# Patient Record
Sex: Female | Born: 1935 | Race: White | Hispanic: No | Smoking: Former smoker
Health system: Southern US, Community
[De-identification: ages and names within clinical notes are randomized; demographics above are authoritative.]

## PROBLEM LIST (undated history)

## (undated) DIAGNOSIS — H3530 Unspecified macular degeneration: Secondary | ICD-10-CM

## (undated) DIAGNOSIS — H547 Unspecified visual loss: Secondary | ICD-10-CM

## (undated) DIAGNOSIS — J309 Allergic rhinitis, unspecified: Secondary | ICD-10-CM

## (undated) DIAGNOSIS — F329 Major depressive disorder, single episode, unspecified: Secondary | ICD-10-CM

## (undated) DIAGNOSIS — E039 Hypothyroidism, unspecified: Secondary | ICD-10-CM

## (undated) DIAGNOSIS — M899 Disorder of bone, unspecified: Secondary | ICD-10-CM

## (undated) DIAGNOSIS — K219 Gastro-esophageal reflux disease without esophagitis: Secondary | ICD-10-CM

## (undated) DIAGNOSIS — F411 Generalized anxiety disorder: Secondary | ICD-10-CM

## (undated) DIAGNOSIS — R0602 Shortness of breath: Secondary | ICD-10-CM

## (undated) DIAGNOSIS — E119 Type 2 diabetes mellitus without complications: Secondary | ICD-10-CM

## (undated) DIAGNOSIS — D869 Sarcoidosis, unspecified: Secondary | ICD-10-CM

## (undated) DIAGNOSIS — M949 Disorder of cartilage, unspecified: Secondary | ICD-10-CM

## (undated) DIAGNOSIS — Z85118 Personal history of other malignant neoplasm of bronchus and lung: Secondary | ICD-10-CM

## (undated) DIAGNOSIS — R7302 Impaired glucose tolerance (oral): Secondary | ICD-10-CM

## (undated) DIAGNOSIS — I2510 Atherosclerotic heart disease of native coronary artery without angina pectoris: Secondary | ICD-10-CM

## (undated) DIAGNOSIS — E785 Hyperlipidemia, unspecified: Secondary | ICD-10-CM

## (undated) DIAGNOSIS — H5440 Blindness, one eye, unspecified eye: Secondary | ICD-10-CM

## (undated) HISTORY — PX: OTHER SURGICAL HISTORY: SHX169

## (undated) HISTORY — DX: Disorder of bone, unspecified: M89.9

## (undated) HISTORY — DX: Major depressive disorder, single episode, unspecified: F32.9

## (undated) HISTORY — DX: Generalized anxiety disorder: F41.1

## (undated) HISTORY — DX: Sarcoidosis, unspecified: D86.9

## (undated) HISTORY — DX: Gastro-esophageal reflux disease without esophagitis: K21.9

## (undated) HISTORY — DX: Atherosclerotic heart disease of native coronary artery without angina pectoris: I25.10

## (undated) HISTORY — DX: Disorder of cartilage, unspecified: M94.9

## (undated) HISTORY — DX: Hypothyroidism, unspecified: E03.9

## (undated) HISTORY — DX: Impaired glucose tolerance (oral): R73.02

## (undated) HISTORY — DX: Unspecified macular degeneration: H35.30

## (undated) HISTORY — DX: Type 2 diabetes mellitus without complications: E11.9

## (undated) HISTORY — DX: Hyperlipidemia, unspecified: E78.5

## (undated) HISTORY — DX: Shortness of breath: R06.02

## (undated) HISTORY — DX: Allergic rhinitis, unspecified: J30.9

## (undated) HISTORY — DX: Personal history of other malignant neoplasm of bronchus and lung: Z85.118

## (undated) HISTORY — DX: Unspecified visual loss: H54.7

## (undated) HISTORY — DX: Blindness, one eye, unspecified eye: H54.40

---

## 1994-05-29 HISTORY — PX: OTHER SURGICAL HISTORY: SHX169

## 2006-01-01 ENCOUNTER — Ambulatory Visit: Payer: Self-pay | Admitting: Internal Medicine

## 2006-01-15 ENCOUNTER — Ambulatory Visit: Payer: Self-pay | Admitting: Internal Medicine

## 2006-02-22 ENCOUNTER — Ambulatory Visit: Payer: Self-pay | Admitting: Internal Medicine

## 2006-08-27 ENCOUNTER — Ambulatory Visit: Payer: Self-pay | Admitting: Internal Medicine

## 2006-10-03 ENCOUNTER — Ambulatory Visit: Payer: Self-pay | Admitting: Internal Medicine

## 2006-10-03 LAB — CONVERTED CEMR LAB
Albumin: 3.5 g/dL (ref 3.5–5.2)
Alkaline Phosphatase: 59 units/L (ref 39–117)
BUN: 5 mg/dL — ABNORMAL LOW (ref 6–23)
Basophils Relative: 0.7 % (ref 0.0–1.0)
Chloride: 104 meq/L (ref 96–112)
Cholesterol: 218 mg/dL (ref 0–200)
Creatinine, Ser: 0.9 mg/dL (ref 0.4–1.2)
Crystals: NEGATIVE
HDL: 48.5 mg/dL (ref >39.0)
Monocytes Relative: 11.5 % — ABNORMAL HIGH (ref 3.0–11.0)
Platelets: 333 10*3/uL (ref 150–400)
Potassium: 4 meq/L (ref 3.5–5.1)
RDW: 12.3 % (ref 11.5–14.6)
Specific Gravity, Urine: <=1.005 (ref 1.000–1.03)
TSH: 9.33 microintl units/mL — ABNORMAL HIGH (ref 0.35–5.50)
Total Bilirubin: 1 mg/dL (ref 0.3–1.2)
Total CHOL/HDL Ratio: 4.5
Urine Glucose: NEGATIVE mg/dL
VLDL: 51 mg/dL — ABNORMAL HIGH (ref 0–40)

## 2006-10-07 ENCOUNTER — Encounter: Admission: RE | Admit: 2006-10-07 | Discharge: 2006-10-07 | Payer: Self-pay | Admitting: Internal Medicine

## 2006-10-11 ENCOUNTER — Ambulatory Visit: Payer: Self-pay | Admitting: Internal Medicine

## 2006-10-15 LAB — CBC WITH DIFFERENTIAL/PLATELET
EOS%: 0.3 % (ref 0.0–7.0)
Eosinophils Absolute: 0 10*3/uL (ref 0.0–0.5)
MCV: 97.6 fL (ref 81.0–101.0)
MONO%: 11.7 % (ref 0.0–13.0)
NEUT#: 3.9 10*3/uL (ref 1.5–6.5)
RBC: 4.17 10*6/uL (ref 3.70–5.32)
RDW: 14 % (ref 11.3–14.5)
WBC: 5.6 10*3/uL (ref 3.9–10.0)

## 2006-10-15 LAB — COMPREHENSIVE METABOLIC PANEL
AST: 20 U/L (ref 0–37)
Albumin: 4.2 g/dL (ref 3.5–5.2)
Alkaline Phosphatase: 63 U/L (ref 39–117)
Glucose, Bld: 79 mg/dL (ref 70–99)
Potassium: 4.3 mEq/L (ref 3.5–5.3)
Sodium: 141 mEq/L (ref 135–145)
Total Protein: 7.2 g/dL (ref 6.0–8.3)

## 2006-10-18 ENCOUNTER — Ambulatory Visit (HOSPITAL_COMMUNITY): Admission: RE | Admit: 2006-10-18 | Discharge: 2006-10-18 | Payer: Self-pay | Admitting: Internal Medicine

## 2006-11-25 ENCOUNTER — Ambulatory Visit: Payer: Self-pay | Admitting: Internal Medicine

## 2006-11-25 LAB — CONVERTED CEMR LAB: Angiotensin 1 Converting Enzyme: 80 units/L — ABNORMAL HIGH (ref 9–67)

## 2006-12-10 ENCOUNTER — Ambulatory Visit: Payer: Self-pay | Admitting: Critical Care Medicine

## 2008-01-29 ENCOUNTER — Telehealth (INDEPENDENT_AMBULATORY_CARE_PROVIDER_SITE_OTHER): Payer: Self-pay | Admitting: *Deleted

## 2008-02-11 ENCOUNTER — Ambulatory Visit: Payer: Self-pay | Admitting: Internal Medicine

## 2008-02-11 DIAGNOSIS — M899 Disorder of bone, unspecified: Secondary | ICD-10-CM

## 2008-02-11 DIAGNOSIS — I2510 Atherosclerotic heart disease of native coronary artery without angina pectoris: Secondary | ICD-10-CM

## 2008-02-11 DIAGNOSIS — D869 Sarcoidosis, unspecified: Secondary | ICD-10-CM

## 2008-02-11 DIAGNOSIS — F3289 Other specified depressive episodes: Secondary | ICD-10-CM

## 2008-02-11 DIAGNOSIS — E039 Hypothyroidism, unspecified: Secondary | ICD-10-CM

## 2008-02-11 DIAGNOSIS — F411 Generalized anxiety disorder: Secondary | ICD-10-CM

## 2008-02-11 DIAGNOSIS — E785 Hyperlipidemia, unspecified: Secondary | ICD-10-CM | POA: Insufficient documentation

## 2008-02-11 DIAGNOSIS — M949 Disorder of cartilage, unspecified: Secondary | ICD-10-CM

## 2008-02-11 DIAGNOSIS — Z85118 Personal history of other malignant neoplasm of bronchus and lung: Secondary | ICD-10-CM

## 2008-02-11 DIAGNOSIS — F329 Major depressive disorder, single episode, unspecified: Secondary | ICD-10-CM

## 2008-02-11 DIAGNOSIS — K219 Gastro-esophageal reflux disease without esophagitis: Secondary | ICD-10-CM

## 2008-02-11 DIAGNOSIS — J309 Allergic rhinitis, unspecified: Secondary | ICD-10-CM

## 2008-02-11 HISTORY — DX: Hypothyroidism, unspecified: E03.9

## 2008-02-11 HISTORY — DX: Personal history of other malignant neoplasm of bronchus and lung: Z85.118

## 2008-02-11 HISTORY — DX: Sarcoidosis, unspecified: D86.9

## 2008-02-11 HISTORY — DX: Disorder of bone, unspecified: M89.9

## 2008-02-11 HISTORY — DX: Generalized anxiety disorder: F41.1

## 2008-02-11 HISTORY — DX: Allergic rhinitis, unspecified: J30.9

## 2008-02-11 HISTORY — DX: Gastro-esophageal reflux disease without esophagitis: K21.9

## 2008-02-11 HISTORY — DX: Major depressive disorder, single episode, unspecified: F32.9

## 2008-02-11 HISTORY — DX: Atherosclerotic heart disease of native coronary artery without angina pectoris: I25.10

## 2008-02-11 HISTORY — DX: Other specified depressive episodes: F32.89

## 2008-02-11 HISTORY — DX: Hyperlipidemia, unspecified: E78.5

## 2008-02-12 ENCOUNTER — Encounter (INDEPENDENT_AMBULATORY_CARE_PROVIDER_SITE_OTHER): Payer: Self-pay | Admitting: *Deleted

## 2008-02-12 ENCOUNTER — Telehealth: Payer: Self-pay | Admitting: Internal Medicine

## 2008-02-13 LAB — CONVERTED CEMR LAB
ALT: 19 units/L (ref 0–35)
Basophils Absolute: 0.1 10*3/uL (ref 0.0–0.1)
Basophils Relative: 2.3 % (ref 0.0–3.0)
Bilirubin Urine: NEGATIVE
Bilirubin, Direct: 0.1 mg/dL (ref 0.0–0.3)
Calcium: 9.3 mg/dL (ref 8.4–10.5)
Cholesterol: 320 mg/dL (ref 0–200)
Creatinine, Ser: 1.2 mg/dL (ref 0.4–1.2)
Crystals: NEGATIVE
Direct LDL: 200.8 mg/dL
GFR calc Af Amer: 56 mL/min
Glucose, Bld: 94 mg/dL (ref 70–99)
HCT: 42.1 % (ref 36.0–46.0)
Hemoglobin: 14.7 g/dL (ref 12.0–15.0)
Ketones, ur: NEGATIVE mg/dL
MCHC: 35 g/dL (ref 30.0–36.0)
MCV: 107 fL — ABNORMAL HIGH (ref 78.0–100.0)
Monocytes Absolute: 0.6 10*3/uL (ref 0.1–1.0)
Neutro Abs: 4.1 10*3/uL (ref 1.4–7.7)
RDW: 12.8 % (ref 11.5–14.6)
Sodium: 139 meq/L (ref 135–145)
TSH: 6.47 microintl units/mL — ABNORMAL HIGH (ref 0.35–5.50)
Total Bilirubin: 0.8 mg/dL (ref 0.3–1.2)
Total CHOL/HDL Ratio: 7.7
Total Protein, Urine: NEGATIVE mg/dL
Total Protein: 7.5 g/dL (ref 6.0–8.3)
Triglycerides: 433 mg/dL (ref 0–149)
Urine Glucose: NEGATIVE mg/dL
pH: 6 (ref 5.0–8.0)

## 2008-02-16 LAB — CONVERTED CEMR LAB: Vit D, 1,25-Dihydroxy: 5 — ABNORMAL LOW (ref 30–89)

## 2008-09-01 ENCOUNTER — Telehealth: Payer: Self-pay | Admitting: Internal Medicine

## 2009-02-25 ENCOUNTER — Telehealth: Payer: Self-pay | Admitting: Internal Medicine

## 2009-03-29 ENCOUNTER — Ambulatory Visit: Payer: Self-pay | Admitting: Internal Medicine

## 2009-03-29 DIAGNOSIS — H3530 Unspecified macular degeneration: Secondary | ICD-10-CM

## 2009-03-29 HISTORY — DX: Unspecified macular degeneration: H35.30

## 2009-03-29 LAB — CONVERTED CEMR LAB
ALT: 13 units/L (ref 0–35)
AST: 17 units/L (ref 0–37)
Alkaline Phosphatase: 63 units/L (ref 39–117)
BUN: 13 mg/dL (ref 6–23)
Basophils Relative: 0.1 % (ref 0.0–3.0)
Bilirubin, Direct: 0 mg/dL (ref 0.0–0.3)
Chloride: 103 meq/L (ref 96–112)
Creatinine, Ser: 0.8 mg/dL (ref 0.4–1.2)
Eosinophils Relative: 5.1 % — ABNORMAL HIGH (ref 0.0–5.0)
GFR calc non Af Amer: 74.29 mL/min (ref >60)
Ketones, ur: NEGATIVE mg/dL
Lymphocytes Relative: 16.7 % (ref 12.0–46.0)
MCV: 104.2 fL — ABNORMAL HIGH (ref 78.0–100.0)
Monocytes Relative: 10.2 % (ref 3.0–12.0)
Neutrophils Relative %: 67.9 % (ref 43.0–77.0)
Nitrite: POSITIVE
Platelets: 292 10*3/uL (ref 150.0–400.0)
RBC: 3.85 M/uL — ABNORMAL LOW (ref 3.87–5.11)
Specific Gravity, Urine: 1.02 (ref 1.000–1.030)
TSH: 5.45 microintl units/mL (ref 0.35–5.50)
Total Bilirubin: 1 mg/dL (ref 0.3–1.2)
Total CHOL/HDL Ratio: 5
Total Protein: 7.3 g/dL (ref 6.0–8.3)
Triglycerides: 301 mg/dL — ABNORMAL HIGH (ref 0.0–149.0)
Urobilinogen, UA: 1 (ref 0.0–1.0)
WBC: 5.9 10*3/uL (ref 4.5–10.5)
pH: 6 (ref 5.0–8.0)

## 2009-08-08 ENCOUNTER — Encounter (INDEPENDENT_AMBULATORY_CARE_PROVIDER_SITE_OTHER): Payer: Self-pay | Admitting: *Deleted

## 2009-11-28 ENCOUNTER — Telehealth: Payer: Self-pay | Admitting: Internal Medicine

## 2010-04-21 ENCOUNTER — Telehealth: Payer: Self-pay | Admitting: Internal Medicine

## 2010-06-26 ENCOUNTER — Telehealth: Payer: Self-pay | Admitting: Internal Medicine

## 2010-08-02 ENCOUNTER — Telehealth: Payer: Self-pay | Admitting: Internal Medicine

## 2010-08-20 ENCOUNTER — Encounter: Payer: Self-pay | Admitting: Ophthalmology

## 2010-08-29 NOTE — Progress Notes (Signed)
Summary: REFILL   Phone Note Call from Patient Call back at Home Phone [phone]   Summary of Call: Patient is requesting refill on lovastatin & levothyoxine - has apt in december.  Initial call taken by: Lamar Sprinkles, CMA,  June 26, 2010 2:12 PM    Prescriptions: LOVASTATIN 40 MG  TABS (LOVASTATIN) 1 by mouth once daily  #30 Tablet x 1   Entered by:   Scharlene Gloss CMA (AAMA)   Authorized by:   Corwin Levins MD   Signed by:   Scharlene Gloss CMA (AAMA) on 06/26/2010   Method used:   Faxed to ...       [facility] (retail)       [address]       [place], [place]  [address]       Ph: [phone] or [phone]       Fax: [phone]   RxID:   [id number] LEVOTHYROXINE SODIUM 75 MCG  TABS (LEVOTHYROXINE SODIUM) 1 by mouth once daily  #30 Tablet x 1   Entered by:   Scharlene Gloss CMA (AAMA)   Authorized by:   Corwin Levins MD   Signed by:   Scharlene Gloss CMA (AAMA) on 06/26/2010   Method used:   Faxed to ...       [facility] (retail)       [address]       [place], [place]  [address]       Ph: [phone] or [phone]       Fax: [phone]   RxID:   [id number]

## 2010-08-29 NOTE — Progress Notes (Signed)
  Phone Note Refill Request Message from:  Fax from Pharmacy on April 21, 2010 4:51 PM  Refills Requested: Medication #1:  LEVOTHYROXINE SODIUM 75 MCG  TABS 1 by mouth once daily   Dosage confirmed as above?Dosage Confirmed   Last Refilled: 03/29/2009   Notes: Karin Golden [place]  Medication #2:  LOVASTATIN 40 MG  TABS 1 by mouth once daily   Dosage confirmed as above?Dosage Confirmed   Last Refilled: 03/29/2009   Notes: Karin Golden [place] Initial call taken by: Zella Ball Ewing CMA Duncan Dull),  April 21, 2010 4:52 PM    Prescriptions: LOVASTATIN 40 MG  TABS (LOVASTATIN) 1 by mouth once daily  #30 x 0   Entered by:   Scharlene Gloss CMA (AAMA)   Authorized by:   Corwin Levins MD   Signed by:   Scharlene Gloss CMA (AAMA) on 04/21/2010   Method used:   Faxed to ...       [facility] (retail)       [address]       [place], [place]  [address]       Ph: [phone] or [phone]       Fax: [phone]   RxID:   [id number] LEVOTHYROXINE SODIUM 75 MCG  TABS (LEVOTHYROXINE SODIUM) 1 by mouth once daily  #30 x 0   Entered by:   Zella Ball Ewing CMA (AAMA)   Authorized by:   Corwin Levins MD   Signed by:   Scharlene Gloss CMA (AAMA) on 04/21/2010   Method used:   Faxed to ...       [facility] (retail)       [address]       [place], [place]  [address]       Ph: [phone] or [phone]       Fax: [phone]   RxID:   [id number]

## 2010-08-29 NOTE — Progress Notes (Signed)
Summary: Med Refill  Phone Note Refill Request  on Nov 28, 2009 9:11 AM  Refills Requested: Medication #1:  DIAZEPAM 10 MG TABS 1 by mouth two times a day as needed   Dosage confirmed as above?Dosage Confirmed   Notes: Karin Golden, [place] [phone] Initial call taken by: Scharlene Gloss,  Nov 28, 2009 9:11 AM  Follow-up for Phone Call        Rx faxed to pharmacy Follow-up by: Margaret Pyle, CMA,  Nov 28, 2009 1:19 PM    New/Updated Medications: DIAZEPAM 10 MG TABS (DIAZEPAM) 1 by mouth two times a day as needed - please make return office visit for further refills Prescriptions: DIAZEPAM 10 MG TABS (DIAZEPAM) 1 by mouth two times a day as needed - please make return office visit for further refills  #10 x 5   Entered and Authorized by:   Corwin Levins MD   Signed by:   Corwin Levins MD on 11/28/2009   Method used:   Print then Give to Patient   RxID:   [id number]  done hardcopy to LIM side B - dahlia Corwin Levins MD  Nov 28, 2009 1:00 PM

## 2010-08-29 NOTE — Letter (Signed)
Summary: [facility] Referral (unable to schedule) Notification  [facility]  [address] [place], [place] [address]   Phone: [phone]  Fax: [phone]      August 08, 2009 [facility] 1931-09-20 MRN: [id number]   [facility] [address] [place], [place]  [address]   Dear Dr. Jonny Ruiz:   Thank you for your kind referral of the above patient. We have attempted to schedule the recommended Colonoscopy but have been unable to schedule because:  _x_ The patient was not available by phone and/or has not returned our calls.  __ The patient declined to schedule the procedure at this time.  We appreciate the referral and hope that we will have the opportunity to treat this patient in the future.    Sincerely,   [facility]  Vania Rea. Jarold Motto M.D. Hedwig Morton. Juanda Chance M.D. Venita Lick. Russella Dar M.D. Wilhemina Bonito. Marina Goodell M.D. Barbette Hair. Arlyce Dice M.D. Iva Boop M.D. Cheron Every.D.

## 2010-08-31 NOTE — Progress Notes (Signed)
Summary: Rx refill req  Phone Note Call from Patient Call back at Home Phone [phone]   Caller: Patient Call For: Corwin Levins MD Summary of Call: Pt has appt for yearly f/u for mid Feb, pt needs synthroid & cholesterol med, pt will be out within a wk. Karin Golden [phone] [facility] [phone]) Initial call taken by: Verdell Face,  August 02, 2010 1:33 PM    Prescriptions: LOVASTATIN 40 MG  TABS (LOVASTATIN) 1 by mouth once daily  #30 Tablet x 1   Entered by:   Margaret Pyle, CMA   Authorized by:   Corwin Levins MD   Signed by:   Margaret Pyle, CMA on 08/02/2010   Method used:   Electronically to        [facility] (retail)       [address]       [place], [place]  [address]       Ph: [phone] or [phone]       Fax: [phone]   RxID:   [id number] LEVOTHYROXINE SODIUM 75 MCG  TABS (LEVOTHYROXINE SODIUM) 1 by mouth once daily  #30 Tablet x 1   Entered by:   Margaret Pyle, CMA   Authorized by:   Corwin Levins MD   Signed by:   Margaret Pyle, CMA on 08/02/2010   Method used:   Electronically to        [facility] (retail)       [address]       [place], [place]  [address]       Ph: [phone] or [phone]       Fax: [phone]   RxID:   [id number]

## 2010-09-07 ENCOUNTER — Ambulatory Visit: Payer: Self-pay | Admitting: Internal Medicine

## 2010-09-11 ENCOUNTER — Other Ambulatory Visit: Payer: Self-pay | Admitting: Internal Medicine

## 2010-09-11 ENCOUNTER — Ambulatory Visit (INDEPENDENT_AMBULATORY_CARE_PROVIDER_SITE_OTHER): Payer: Medicare PPO | Admitting: Internal Medicine

## 2010-09-11 ENCOUNTER — Other Ambulatory Visit: Payer: Medicare PPO

## 2010-09-11 ENCOUNTER — Encounter: Payer: Self-pay | Admitting: Internal Medicine

## 2010-09-11 ENCOUNTER — Encounter (INDEPENDENT_AMBULATORY_CARE_PROVIDER_SITE_OTHER): Payer: Self-pay | Admitting: *Deleted

## 2010-09-11 DIAGNOSIS — R0602 Shortness of breath: Secondary | ICD-10-CM

## 2010-09-11 DIAGNOSIS — Z0000 Encounter for general adult medical examination without abnormal findings: Secondary | ICD-10-CM

## 2010-09-11 DIAGNOSIS — E039 Hypothyroidism, unspecified: Secondary | ICD-10-CM

## 2010-09-11 DIAGNOSIS — Z23 Encounter for immunization: Secondary | ICD-10-CM

## 2010-09-11 DIAGNOSIS — E785 Hyperlipidemia, unspecified: Secondary | ICD-10-CM

## 2010-09-11 HISTORY — DX: Shortness of breath: R06.02

## 2010-09-11 LAB — CBC WITH DIFFERENTIAL/PLATELET
Basophils Absolute: 0.1 10*3/uL (ref 0.0–0.1)
Eosinophils Absolute: 0.3 10*3/uL (ref 0.0–0.7)
HCT: 40.9 % (ref 36.0–46.0)
Hemoglobin: 14.2 g/dL (ref 12.0–15.0)
Lymphs Abs: 1.4 10*3/uL (ref 0.7–4.0)
MCHC: 34.6 g/dL (ref 30.0–36.0)
Neutro Abs: 7.7 10*3/uL (ref 1.4–7.7)
Platelets: 351 10*3/uL (ref 150.0–400.0)
RDW: 14.9 % — ABNORMAL HIGH (ref 11.5–14.6)

## 2010-09-11 LAB — LIPID PANEL
Cholesterol: 186 mg/dL (ref 0–200)
HDL: 32.3 mg/dL — ABNORMAL LOW (ref >39.00)
Triglycerides: 408 mg/dL — ABNORMAL HIGH (ref 0.0–149.0)
VLDL: 81.6 mg/dL — ABNORMAL HIGH (ref 0.0–40.0)

## 2010-09-11 LAB — URINALYSIS, ROUTINE W REFLEX MICROSCOPIC
Bilirubin Urine: NEGATIVE
Hgb urine dipstick: NEGATIVE
Ketones, ur: NEGATIVE
Total Protein, Urine: NEGATIVE
Urine Glucose: NEGATIVE

## 2010-09-11 LAB — LDL CHOLESTEROL, DIRECT: Direct LDL: 82.4 mg/dL

## 2010-09-11 LAB — BASIC METABOLIC PANEL
BUN: 11 mg/dL (ref 6–23)
CO2: 27 mEq/L (ref 19–32)
Calcium: 9 mg/dL (ref 8.4–10.5)
Glucose, Bld: 102 mg/dL — ABNORMAL HIGH (ref 70–99)
Potassium: 4.7 mEq/L (ref 3.5–5.1)
Sodium: 138 mEq/L (ref 135–145)

## 2010-09-11 LAB — HEPATIC FUNCTION PANEL: Albumin: 3.7 g/dL (ref 3.5–5.2)

## 2010-09-14 ENCOUNTER — Other Ambulatory Visit: Payer: Medicare PPO

## 2010-09-26 NOTE — Assessment & Plan Note (Signed)
Summary: DISCUSS STOMACH ISSUES  STC   Vital Signs:  Patient profile:   77 year old female Height:      67 inches Weight:      245.25 pounds BMI:     38.55 O2 Sat:      95 % on Room air Temp:     97.9 degrees F oral Pulse rate:   83 / minute BP sitting:   110 / 68  (left arm) Cuff size:   large  Vitals Entered By: Zella Ball Ewing CMA Duncan Dull) (September 11, 2010 9:32 AM)  O2 Flow:  Room air  Preventive Care Screening     has put off the mammogram but plans to soon  CC: Stomach problems, spot on right breast/RE   CC:  Stomach problems and spot on right breast/RE.  History of Present Illness: here to f/u; no longer drives for several yrs, daughter drove her here;  transportation is an issue, last seen aug 2010 but duaghter had surgury last yr;  missed appt here last wk due to transportation issue;  Pt denies CP, wheezing, orthopnea, pnd, worsening LE edema, palps, dizziness or syncope  but does have mild dyspnea on occasionl wihtout ST, cough.  Pt denies new neuro symptoms such as headache, facial or extremity weakness  Pt denies polydipsia, polyuria   Overall good compliance with meds, trying to follow low chol diet, wt stable, little excercise however .  No fever, wt loss, night sweats, loss of appetite or other constitutional symptoms  Overall good compliance with meds, and good tolerability.  Denies worsening depressive symptoms, suicidal ideation, or panic.   Pt states good ability with ADL's, low fall risk, home safety reviewed and adequate, no significant change in hearing or vision, trying to follow lower chol diet, and occasionally active only with regular excercise.   Preventive Screening-Counseling & Management  Alcohol-Tobacco     Smoking Status: never      Drug Use:  no.    Problems Prior to Update: 1)  Need Prophylactic Vaccination&inoculation Flu  (ICD-V04.81) 2)  Dyspnea  (ICD-786.05) 3)  Macular Degeneration  (ICD-362.50) 4)  Osteopenia  (ICD-733.90) 5)  Coronary  Artery Disease  (ICD-414.00) 6)  Allergic Rhinitis  (ICD-477.9) 7)  Gerd  (ICD-530.81) 8)  Lung Cancer, Hx of  (ICD-V10.11) 9)  Preventive Health Care  (ICD-V70.0) 10)  Depression  (ICD-311) 11)  Anxiety  (ICD-300.00) 12)  Hypothyroidism  (ICD-244.9) 13)  Hyperlipidemia  (ICD-272.4) 14)  Sarcoidosis  (ICD-135)  Medications Prior to Update: 1)  Levothyroxine Sodium 75 Mcg  Tabs (Levothyroxine Sodium) .Marland Kitchen.. 1 By Mouth Once Daily 2)  Celexa 20 Mg  Tabs (Citalopram Hydrobromide) .... Take 1 Tablet By Mouth Once A Day 3)  Lovastatin 40 Mg  Tabs (Lovastatin) .Marland Kitchen.. 1 By Mouth Once Daily 4)  Adult Aspirin Ec Low Strength 81 Mg  Tbec (Aspirin) .Marland Kitchen.. 1 By Mouth Once Daily 5)  Diazepam 10 Mg Tabs (Diazepam) .Marland Kitchen.. 1 By Mouth Two Times A Day As Needed - Please Make Return Office Visit For Further Refills 6)  Vitamin D 1000 Unit  Tabs (Cholecalciferol) .Marland Kitchen.. 1 By Mouth Once Daily 7)  Ciprofloxacin Hcl 500 Mg Tabs (Ciprofloxacin Hcl) .Marland Kitchen.. 1 By Mouth Two Times A Day  Current Medications (verified): 1)  Levothyroxine Sodium 75 Mcg  Tabs (Levothyroxine Sodium) .Marland Kitchen.. 1 By Mouth Once Daily 2)  Celexa 20 Mg  Tabs (Citalopram Hydrobromide) .... Take 1 Tablet By Mouth Once A Day 3)  Lovastatin 40 Mg  Tabs (  Lovastatin) .Marland Kitchen.. 1 By Mouth Once Daily 4)  Adult Aspirin Ec Low Strength 81 Mg  Tbec (Aspirin) .Marland Kitchen.. 1 By Mouth Once Daily 5)  Diazepam 10 Mg Tabs (Diazepam) .Marland Kitchen.. 1 By Mouth Two Times A Day As Needed 6)  Vitamin D 1000 Unit  Tabs (Cholecalciferol) .Marland Kitchen.. 1 By Mouth Once Daily  Allergies (verified): 1)  ! Demerol  Past History:  Past Surgical History: Last updated: 02/11/2008 s/p left upper lobectomy s/p arm surgury  Family History: Last updated: 02/11/2008 daughter with allergies mother with thyroid cancer daughter with breast cancer  Social History: Last updated: 09/11/2010 retired Producer, television/film/video Widow/Widower Drug use-no Never Smoked Alcohol use-no  Past Medical  History: sarcoidosis Hyperlipidemia Hypothyroidism Anxiety Depression hx of ETOH abuse hx of Lung cancer - LUL, non-small cell GERD glaucoma hx of pos PPD hx of right arm fx Allergic rhinitis Coronary artery disease - by cath, non-obstructive Osteopenia macular degeneration  - wet and dry with right eye near blindness, and left eye severe center vision loss  Social History: retired Financial controller Drug use-no Never Smoked Alcohol use-no Drug Use:  no Smoking Status:  never  Review of Systems  The patient denies anorexia, fever, vision loss, decreased hearing, hoarseness, chest pain, syncope, dyspnea on exertion, peripheral edema, prolonged cough, headaches, hemoptysis, abdominal pain, melena, hematochezia, severe indigestion/heartburn, hematuria, muscle weakness, suspicious skin lesions, transient blindness, difficulty walking, depression, unusual weight change, abnormal bleeding, enlarged lymph nodes, and angioedema.         all otherwise negative per pt -  does have mild reflux symptoms with dietary indiscretion, no dysphagia, n/v, abd pain, blood  Physical Exam  General:  alert and overweight-appearing.   Head:  normocephalic and atraumatic.   Eyes:  vision grossly intact, pupils equal, and pupils round.   Ears:  R ear normal and L ear normal.   Nose:  no external deformity and no nasal discharge.   Mouth:  no gingival abnormalities and pharynx pink and moist.   Neck:  supple and no masses.   Lungs:  normal respiratory effort and normal breath sounds.   Heart:  normal rate and regular rhythm.   Abdomen:  soft, non-tender, and normal bowel sounds.   Msk:  no joint tenderness and no joint swelling.   Extremities:  no edema, no erythema Neurologic:  cranial nerves II-XII intact and strength normal in all extremities.   Skin:  color normal and no rashes.   Psych:  not depressed appearing and slightly anxious.     Impression & Recommendations:  Problem #  1:  Preventive Health Care (ICD-V70.0)  Overall doing well, age appropriate education and counseling updated, referral for preventive services and immunizations addressed, dietary counseling and smoking status adressed , most recent labs reviewed, ecg declined today - "I've had so many" I have personally reviewed and have noted 1.The patient's medical and social history 2.Their use of alcohol, tobacco or illicit drugs 3.Their current medications and supplements 4. Functional ability including ADL's, fall risk, home safety risk, hearing & visual impairment  5.Diet and physical activities 6.Evidence for depression or mood disorders The patients weight, height, BMI  have been recorded in the chart I have made referrals, counseling and provided education to the patient based review of the above   Orders: TLB-BMP (Basic Metabolic Panel-BMET) (80048-METABOL) TLB-CBC Platelet - w/Differential (85025-CBCD) TLB-Hepatic/Liver Function Pnl (80076-HEPATIC) TLB-Lipid Panel (80061-LIPID) TLB-TSH (Thyroid Stimulating Hormone) (84443-TSH) TLB-Udip ONLY (81003-UDIP)  Problem # 2:  OSTEOPENIA (ICD-733.90)  due for  dxa - will order  Orders: T-Bone Densitometry [phone])  Problem # 3:  GERD (ICD-530.81) declines PPI,  mild, for dietary anti-reflux precautions, and tums as needed   Problem # 4:  DYSPNEA (ICD-786.05) exam benign, to check labs below; follow with expectant management , consider echo; ? related to deconditioning, anxiety or other? Orders: T-2 View CXR, Same Day (71020.5TC)  Complete Medication List: 1)  Levothyroxine Sodium 75 Mcg Tabs (Levothyroxine sodium) .Marland Kitchen.. 1 by mouth once daily 2)  Celexa 20 Mg Tabs (Citalopram hydrobromide) .... Take 1 tablet by mouth once a day 3)  Lovastatin 40 Mg Tabs (Lovastatin) .Marland Kitchen.. 1 by mouth once daily 4)  Adult Aspirin Ec Low Strength 81 Mg Tbec (Aspirin) .Marland Kitchen.. 1 by mouth once daily 5)  Diazepam 10 Mg Tabs (Diazepam) .Marland Kitchen.. 1 by mouth two times a day as  needed 6)  Vitamin D 1000 Unit Tabs (Cholecalciferol) .Marland Kitchen.. 1 by mouth once daily  Other Orders: Tdap => 30yrs IM (19147) Pneumococcal Vaccine (82956) Admin of Any Addtl Vaccine (21308) Flu Vaccine 19yrs + MEDICARE PATIENTS (M5784) Administration Flu vaccine - MCR (O9629)  Patient Instructions: 1)  you had the pneumonia shot today, flu shot , and tetanus shots 2)  Please schedule the bone density test at the desk as you leave today 3)  Please go to the Lab in the basement for your blood and/or urine tests today 4)  Please go to Radiology in the basement level for your X-Ray today  5)  Please call the number on the [facility] Card for results of your testing 6)  Please call for your yearly mammogram - consider [facility] on [address], or [facility] on [address]  7)  Continue all previous medications as before this visit  8)  Please schedule a follow-up appointment in 6 months Prescriptions: DIAZEPAM 10 MG TABS (DIAZEPAM) 1 by mouth two times a day as needed  #10 x 5   Entered and Authorized by:   Corwin Levins MD   Signed by:   Corwin Levins MD on 09/11/2010   Method used:   Print then Give to Patient   RxID:   [id number] LOVASTATIN 40 MG  TABS (LOVASTATIN) 1 by mouth once daily  #90 x 3   Entered and Authorized by:   Corwin Levins MD   Signed by:   Corwin Levins MD on 09/11/2010   Method used:   Print then Give to Patient   RxID:   [id number] CELEXA 20 MG  TABS (CITALOPRAM HYDROBROMIDE) Take 1 tablet by mouth once a day  #90 x 3   Entered and Authorized by:   Corwin Levins MD   Signed by:   Corwin Levins MD on 09/11/2010   Method used:   Print then Give to Patient   RxID:   [id number] LEVOTHYROXINE SODIUM 75 MCG  TABS (LEVOTHYROXINE SODIUM) 1 by mouth once daily  #90 x 3   Entered and Authorized by:   Corwin Levins MD   Signed by:   Corwin Levins MD on 09/11/2010   Method used:   Print then Give to Patient   RxID:   [id number]    Orders Added: 1)  T-Bone  Densitometry [77080] 2)  T-2 View CXR, Same Day [71020.5TC] 3)  Tdap => 4yrs IM [73220] 4)  Pneumococcal Vaccine [25427] 5)  Admin of Any Addtl Vaccine [90472] 6)  Flu Vaccine 34yrs + MEDICARE PATIENTS [Q2039] 7)  Administration Flu vaccine [facility] [  G0008] 8)  TLB-BMP (Basic Metabolic Panel-BMET) [80048-METABOL] 9)  TLB-CBC Platelet - w/Differential [85025-CBCD] 10)  TLB-Hepatic/Liver Function Pnl [80076-HEPATIC] 11)  TLB-Lipid Panel [80061-LIPID] 12)  TLB-TSH (Thyroid Stimulating Hormone) [84443-TSH] 13)  TLB-Udip ONLY [81003-UDIP] 14)  Est. Patient 65& > [95621]   Immunizations Administered:  Tetanus Vaccine:    Vaccine Type: Tdap    Site: left deltoid    Mfr: Sanofi Pasteur    Dose: 0.5 ml    Route: IM    Given by: Zella Ball Ewing CMA (AAMA)    Exp. Date: 08/31/2011    Lot #: [id number]    VIS given: 06/17/07 version given September 11, 2010.  Pneumonia Vaccine:    Vaccine Type: Pneumovax    Site: right buttock    Mfr: Merck    Dose: 0.5 ml    Route: IM    Given by: Zella Ball Ewing CMA (AAMA)    Exp. Date: 12/22/2011    Lot #: [id number]    VIS given: 02/25/96 version given September 11, 2010.  Influenza Vaccine # 1:    Vaccine Type: Fluvax 3+    Site: right deltoid    Mfr: Sanofi Pasteur    Dose: 0.5 ml    Route: IM    Given by: Zella Ball Ewing CMA (AAMA)    Exp. Date: 01/27/2011    Lot #: [id number]    VIS given: 02/20/07 version given September 11, 2010.  Flu Vaccine Consent Questions:    Do you have a history of severe allergic reactions to this vaccine? no    Any prior history of allergic reactions to egg and/or gelatin? no    Do you have a sensitivity to the preservative Thimersol? no    Do you have a past history of Guillan-Barre Syndrome? no    Do you currently have an acute febrile illness? no    Have you ever had a severe reaction to latex? no    Vaccine information given and explained to patient? yes    Are you currently pregnant? no       Immunizations  Administered:  Tetanus Vaccine:    Vaccine Type: Tdap    Site: left deltoid    Mfr: Sanofi Pasteur    Dose: 0.5 ml    Route: IM    Given by: Zella Ball Ewing CMA (AAMA)    Exp. Date: 08/31/2011    Lot #: [id number]    VIS given: 06/17/07 version given September 11, 2010.  Pneumonia Vaccine:    Vaccine Type: Pneumovax    Site: right buttock    Mfr: Merck    Dose: 0.5 ml    Route: IM    Given by: Zella Ball Ewing CMA (AAMA)    Exp. Date: 12/22/2011    Lot #: [id number]    VIS given: 02/25/96 version given September 11, 2010.  Influenza Vaccine # 1:    Vaccine Type: Fluvax 3+    Site: right deltoid    Mfr: Sanofi Pasteur    Dose: 0.5 ml    Route: IM    Given by: Zella Ball Ewing CMA (AAMA)    Exp. Date: 01/27/2011    Lot #: [id number]    VIS given: 02/20/07 version given September 11, 2010.

## 2010-12-06 ENCOUNTER — Ambulatory Visit: Payer: Self-pay | Admitting: Internal Medicine

## 2010-12-15 NOTE — Assessment & Plan Note (Signed)
[facility]                             PULMONARY OFFICE NOTE   NAME:Danielle Newman, Danielle Newman                       MRN:          [id number]  DATE:12/11/2006                            DOB:          [birth year]    CHIEF COMPLAINT:  Evaluate densities in the lung.   HISTORY OF PRESENT ILLNESS:  This is a 77 year old female who has had  issues with iritis for the past several months.  She had a raised lesion  to the right neck.  She had a variety of evaluations including  ophthalmology evaluation and oncology, and then subsequent to this, ENT.  She underwent lymph node biopsy of the right neck on November 14, 2006.  Biopsy showed granulomatous inflammation and no organisms seen on  special staining.  She also was seen by ophthalmology and had lesions on  the face as well.  A course of pulse steroids were given.  ACE level was  elevated and the lesion on the neck and face went away, the eyes  improved.  She has been off steroids now for 1 week and has had less  shortness of breath, decreased cough.  The cough is dry only.  She  smoked 2 packs a day for 40 years, quit smoking in 1995.  She has no  chest pain, no irregular heartbeats.  There is no acid indigestion, loss  of appetite, weight change, abdominal pain, difficulty swallowing, or  sore throat.  She does note some nasal congestion and sneezing.  Notes  some anxiety and depression-type symptoms.  She is referred now for  further evaluation.   PAST MEDICAL HISTORY:   MEDICAL:  Increased cholesterol.  Underlying previous lung cancer with  left upper lobe removed in 1995.  Found to have non-small cell  carcinoma.  This was done in [place].  History of chronic allergies.   OPERATIVE HISTORY:  Includes that of left upper lobectomy in the past.  Arm surgery in the past.   MEDICATION ALLERGIES:  DEMEROL.   CURRENT MEDICATIONS:  1. Citalopram 20 mg daily.  2. Lovastatin 40 mg daily.  3. Imdur 30 mg daily.  4. Synthroid 0.075 mg daily.  5. Prednisone eye drops daily.   SOCIAL HISTORY:  Smoking history as noted above.  Retired Producer, television/film/video.   FAMILY HISTORY:  Allergies in daughter.  Mother and daughter had thyroid  cancer and breast cancer.   REVIEW OF SYSTEMS:  Noncontributory.   PHYSICAL EXAM:  This is an elderly white female in no distress.  Temperature 98, blood pressure 128/80, pulse 79, saturation 94% on room  air.  CHEST:  Distant breath sounds.  No wheeze or rales, or rhonchi.  CARDIAC:  Exam showed a regular rate and rhythm without S3.  Normal S1,  S2.  ABDOMEN:  Soft and nontender.  EXTREMITIES:  No edema, clubbing, or venous disease.  SKIN:  Clear.  NEUROLOGIC:  Intact.  HEENT:  No jugular venous distension.  No lymphadenopathy.  Oropharynx  clear.  NECK:  Supple.   A CT scan of the chest is obtained and reviewed, and shows right  upper  lobe reticular nodular density.  Lymph node biopsy results reveal  noncaseating granulomas.  AC level is elevated at 80.   IMPRESSION:  Sarcoidosis stage IV with eye, skin, lymph node, and lung  involvement.  She is stable at this time.   RECOMMENDATIONS:  Obtain full set of pulmonary function studies and  follow expectantly.  We will see the patient back for return followup in  2 months.     Charlcie Cradle Delford Field, MD, [facility]  Electronically Signed    PEW/MedQ  DD: 12/11/2006  DT: 12/11/2006  Job #: [id number]   cc:   Corwin Levins, MD

## 2011-03-26 ENCOUNTER — Ambulatory Visit: Payer: Medicare PPO | Admitting: Internal Medicine

## 2011-04-01 ENCOUNTER — Encounter: Payer: Self-pay | Admitting: Internal Medicine

## 2011-04-01 DIAGNOSIS — Z0001 Encounter for general adult medical examination with abnormal findings: Secondary | ICD-10-CM | POA: Insufficient documentation

## 2011-04-05 ENCOUNTER — Ambulatory Visit (INDEPENDENT_AMBULATORY_CARE_PROVIDER_SITE_OTHER): Payer: Medicare PPO | Admitting: Internal Medicine

## 2011-04-05 ENCOUNTER — Encounter: Payer: Self-pay | Admitting: Internal Medicine

## 2011-04-05 DIAGNOSIS — E785 Hyperlipidemia, unspecified: Secondary | ICD-10-CM

## 2011-04-05 DIAGNOSIS — G43909 Migraine, unspecified, not intractable, without status migrainosus: Secondary | ICD-10-CM | POA: Insufficient documentation

## 2011-04-05 DIAGNOSIS — E039 Hypothyroidism, unspecified: Secondary | ICD-10-CM

## 2011-04-05 DIAGNOSIS — Z23 Encounter for immunization: Secondary | ICD-10-CM

## 2011-04-05 DIAGNOSIS — D869 Sarcoidosis, unspecified: Secondary | ICD-10-CM

## 2011-04-05 DIAGNOSIS — H9191 Unspecified hearing loss, right ear: Secondary | ICD-10-CM | POA: Insufficient documentation

## 2011-04-05 DIAGNOSIS — Z0000 Encounter for general adult medical examination without abnormal findings: Secondary | ICD-10-CM

## 2011-04-05 DIAGNOSIS — H9190 Unspecified hearing loss, unspecified ear: Secondary | ICD-10-CM

## 2011-04-05 DIAGNOSIS — H547 Unspecified visual loss: Secondary | ICD-10-CM

## 2011-04-05 DIAGNOSIS — M899 Disorder of bone, unspecified: Secondary | ICD-10-CM

## 2011-04-05 HISTORY — DX: Unspecified visual loss: H54.7

## 2011-04-05 MED ORDER — DIAZEPAM 10 MG PO TABS: 10.0000 mg | ORAL_TABLET | Freq: Two times a day (BID) | ORAL | Status: DC | PRN

## 2011-04-05 MED ORDER — CITALOPRAM HYDROBROMIDE 20 MG PO TABS: 20.0000 mg | ORAL_TABLET | Freq: Every day | ORAL | Status: DC

## 2011-04-05 MED ORDER — BUTALBITAL-APAP-CAFFEINE 50-325-40 MG PO TABS: ORAL_TABLET | ORAL | Status: DC

## 2011-04-05 MED ORDER — LOVASTATIN 40 MG PO TABS: 40.0000 mg | ORAL_TABLET | Freq: Every day | ORAL | Status: DC

## 2011-04-05 MED ORDER — LEVOTHYROXINE SODIUM 88 MCG PO TABS: 88.0000 ug | ORAL_TABLET | Freq: Every day | ORAL | Status: DC

## 2011-04-05 NOTE — Assessment & Plan Note (Signed)
.  stable overall by hx and exam, most recent data reviewed with pt, and pt to continue medical treatment as before  For cxr today  Lab Results  Component Value Date   WBC 10.3 09/11/2010   HGB 14.2 09/11/2010   HCT 40.9 09/11/2010   PLT 351.0 09/11/2010   CHOL 186 09/11/2010   TRIG 408.0* 09/11/2010   HDL 32.30* 09/11/2010   LDLDIRECT 82.4 09/11/2010   ALT 15 09/11/2010   AST 20 09/11/2010   NA 138 09/11/2010   K 4.7 09/11/2010   CL 97 09/11/2010   CREATININE 1.2 09/11/2010   BUN 11 09/11/2010   CO2 27 09/11/2010   TSH 6.66* 09/11/2010

## 2011-04-05 NOTE — Progress Notes (Signed)
Subjective:    Patient ID: Danielle Newman, female    DOB: October 08, 1931, 77 y.o.   MRN: [id number]  HPI  Here to f/u; overall doing ok, does have decreased right hearing for over a wk, similar to wax impaction episode in the past,.  Also with occurrence of infreq but more severe typical migraine for her in the past 2-3 months;  Right , occipital, throbbing with photophobia and nausea, not responding to usual OTC excedrin migraine.  Pt denies chest pain, increased sob or doe, wheezing, orthopnea, PND, increased LE swelling, palpitations, dizziness or syncope.  Pt denies new neurological symptoms such as new headache, or facial or extremity weakness or numbness.   Pt denies polydipsia, polyuria.  Wants to lose wt but difficult as she has severe vision loss due to sarcoid/macular degeneration/cataract with surgury not expected to help.  Denies worsening depressive symptoms, suicidal ideation, or panic, though has ongoing anxiety, not increased recently.  Needs several refills.  Never did have cxr or bone density done after visit feb 2012. Denies hyper or hypo thyroid symptoms such as voice, skin or hair change.  Past Medical History  Diagnosis Date  . ALLERGIC RHINITIS 02/11/2008  . ANXIETY 02/11/2008  . CORONARY ARTERY DISEASE 02/11/2008  . DEPRESSION 02/11/2008  . DYSPNEA 09/11/2010  . GERD 02/11/2008  . HYPERLIPIDEMIA 02/11/2008  . HYPOTHYROIDISM 02/11/2008  . LUNG CANCER, HX OF 02/11/2008  . MACULAR DEGENERATION 03/29/2009  . OSTEOPENIA 02/11/2008  . Sarcoidosis 02/11/2008   Past Surgical History  Procedure Date  . S/p left upper lobectomy   . S/p arm surgury     reports that she has never smoked. She does not have any smokeless tobacco history on file. She reports that she does not drink alcohol or use illicit drugs. family history includes Allergies in her daughter and Cancer in her daughter and mother. Allergies  Allergen Reactions  . Meperidine Hcl    Current Outpatient Prescriptions on File  Prior to Visit  Medication Sig Dispense Refill  . citalopram (CELEXA) 20 MG tablet Take 20 mg by mouth daily.        . diazepam (VALIUM) 10 MG tablet Take 10 mg by mouth 2 (two) times daily as needed.        Marland Kitchen levothyroxine (SYNTHROID, LEVOTHROID) 75 MCG tablet Take 75 mcg by mouth daily.        Marland Kitchen lovastatin (MEVACOR) 40 MG tablet Take 40 mg by mouth daily.        Marland Kitchen aspirin 81 MG tablet Take 81 mg by mouth daily.         Review of Systems Review of Systems  Constitutional: Negative for diaphoresis and unexpected weight change.  HENT: Negative for drooling and tinnitus.   Eyes: Negative for photophobia and visual disturbance.  Respiratory: Negative for choking and stridor.   Gastrointestinal: Negative for vomiting and blood in stool.  Genitourinary: Negative for hematuria and decreased urine volume.      Objective:   Physical Exam BP 110/60  Pulse 81  Temp(Src) 98 F (36.7 C) (Oral)  Ht 5\' 7"  (1.702 m)  Wt 240 lb 2 oz (108.92 kg)  BMI 37.61 kg/m2  SpO2 93% Physical Exam  VS noted, obese Constitutional: Pt appears well-developed and well-nourished.  HENT: Head: Normocephalic.  Right Ear: External ear normal.  Left Ear: External ear normal. Bilat tm's ok after irrigation today of the right wax impaction Eyes: Conjunctivae and EOM are normal. Pupils are equal, round, and reactive to light.  Neck: Normal range of motion. Neck supple.  Cardiovascular: Normal rate and regular rhythm.   Pulmonary/Chest: Effort normal and breath sounds normal.  Abd:  Soft, NT, non-distended, + BS Neurological: Pt is alert. No cranial nerve deficit. except for chronic severe vision impairment bilat, motor/gait intact Skin: Skin is warm. No erythema.  Psychiatric: Pt behavior is normal. Thought content normal. 1+ nervous, not depressed appearing    Assessment & Plan:

## 2011-04-05 NOTE — Assessment & Plan Note (Signed)
Would avoid maxalt meds at her age though admittedly overall risk is low;  Ok for fioricet prn,  to f/u any worsening symptoms or concerns

## 2011-04-05 NOTE — Assessment & Plan Note (Signed)
Improved after irrigation of the right today,  to f/u any worsening symptoms or concerns

## 2011-04-05 NOTE — Assessment & Plan Note (Signed)
stable overall by hx and exam, most recent data reviewed with pt, and pt to continue medical treatment as before  Last ldl 82  - feb 2012

## 2011-04-05 NOTE — Assessment & Plan Note (Signed)
Due for dxa - ok to order

## 2011-04-05 NOTE — Patient Instructions (Addendum)
Your right ear was irrigated of wax today You had the flu shot today Take all new medications as prescribed - the generic for fioricet for the migraine Continue all other medications as before Please go to XRAY in the Basement for the x-ray test Please stop at the desk as you leave to schedule the bone density exam\Please call the phone number [phone] (the [employer] System) for results of testing in 2-3 days;  When calling, simply dial the number, and when prompted enter the MRN number above (the Medical Record Number) and the # key, then the message should start. Please return in 6 mo with Lab testing done 3-5 days before

## 2011-04-05 NOTE — Assessment & Plan Note (Signed)
Mild uncontrolled with last TSH, hard to lose wt - will incr the synthroid to 88 ; f/u next wk

## 2011-04-12 ENCOUNTER — Other Ambulatory Visit: Payer: Medicare PPO

## 2011-04-19 ENCOUNTER — Other Ambulatory Visit: Payer: Medicare PPO

## 2011-10-04 ENCOUNTER — Ambulatory Visit: Payer: Medicare PPO | Admitting: Internal Medicine

## 2011-10-04 DIAGNOSIS — Z0289 Encounter for other administrative examinations: Secondary | ICD-10-CM

## 2011-10-23 ENCOUNTER — Inpatient Hospital Stay: Admission: RE | Admit: 2011-10-23 | Payer: Medicare PPO | Source: Ambulatory Visit

## 2011-11-07 ENCOUNTER — Ambulatory Visit: Payer: Medicare PPO | Admitting: Internal Medicine

## 2011-11-09 ENCOUNTER — Inpatient Hospital Stay: Admission: RE | Admit: 2011-11-09 | Payer: Medicare PPO | Source: Ambulatory Visit

## 2011-11-20 ENCOUNTER — Other Ambulatory Visit (INDEPENDENT_AMBULATORY_CARE_PROVIDER_SITE_OTHER): Payer: Medicare PPO

## 2011-11-20 ENCOUNTER — Encounter: Payer: Self-pay | Admitting: Internal Medicine

## 2011-11-20 ENCOUNTER — Ambulatory Visit (INDEPENDENT_AMBULATORY_CARE_PROVIDER_SITE_OTHER)
Admission: RE | Admit: 2011-11-20 | Discharge: 2011-11-20 | Disposition: A | Discharge status: Home / Self Care | Payer: Medicare PPO | Source: Ambulatory Visit | Attending: Internal Medicine | Admitting: Internal Medicine

## 2011-11-20 ENCOUNTER — Ambulatory Visit (INDEPENDENT_AMBULATORY_CARE_PROVIDER_SITE_OTHER)
Admission: RE | Admit: 2011-11-20 | Discharge: 2011-11-20 | Disposition: A | Discharge status: Home / Self Care | Payer: Medicare PPO | Source: Ambulatory Visit

## 2011-11-20 ENCOUNTER — Ambulatory Visit (INDEPENDENT_AMBULATORY_CARE_PROVIDER_SITE_OTHER): Payer: Medicare PPO | Admitting: Internal Medicine

## 2011-11-20 VITALS — BP 120/64 | HR 80 | Temp 97.3°F | Ht 67.0 in | Wt 240.2 lb

## 2011-11-20 DIAGNOSIS — Z0000 Encounter for general adult medical examination without abnormal findings: Secondary | ICD-10-CM

## 2011-11-20 DIAGNOSIS — R7302 Impaired glucose tolerance (oral): Secondary | ICD-10-CM | POA: Insufficient documentation

## 2011-11-20 DIAGNOSIS — M899 Disorder of bone, unspecified: Secondary | ICD-10-CM

## 2011-11-20 DIAGNOSIS — R269 Unspecified abnormalities of gait and mobility: Secondary | ICD-10-CM

## 2011-11-20 DIAGNOSIS — H5440 Blindness, one eye, unspecified eye: Secondary | ICD-10-CM

## 2011-11-20 DIAGNOSIS — E785 Hyperlipidemia, unspecified: Secondary | ICD-10-CM

## 2011-11-20 DIAGNOSIS — M949 Disorder of cartilage, unspecified: Secondary | ICD-10-CM

## 2011-11-20 DIAGNOSIS — Z85118 Personal history of other malignant neoplasm of bronchus and lung: Secondary | ICD-10-CM

## 2011-11-20 DIAGNOSIS — R29898 Other symptoms and signs involving the musculoskeletal system: Secondary | ICD-10-CM

## 2011-11-20 HISTORY — DX: Impaired glucose tolerance (oral): R73.02

## 2011-11-20 HISTORY — DX: Blindness, one eye, unspecified eye: H54.40

## 2011-11-20 LAB — CBC WITH DIFFERENTIAL/PLATELET
Basophils Absolute: 0.1 10*3/uL (ref 0.0–0.1)
Eosinophils Relative: 1.4 % (ref 0.0–5.0)
HCT: 37.6 % (ref 36.0–46.0)
Hemoglobin: 12.8 g/dL (ref 12.0–15.0)
Lymphocytes Relative: 19.4 % (ref 12.0–46.0)
Monocytes Relative: 7.1 % (ref 3.0–12.0)
Neutro Abs: 7.3 10*3/uL (ref 1.4–7.7)
Platelets: 326 10*3/uL (ref 150.0–400.0)
RDW: 14.9 % — ABNORMAL HIGH (ref 11.5–14.6)
WBC: 10.2 10*3/uL (ref 4.5–10.5)

## 2011-11-20 LAB — URINALYSIS, ROUTINE W REFLEX MICROSCOPIC
Specific Gravity, Urine: <=1.005 (ref 1.000–1.030)
Urobilinogen, UA: 0.2 (ref 0.0–1.0)
pH: 5.5 (ref 5.0–8.0)

## 2011-11-20 LAB — BASIC METABOLIC PANEL
BUN: 17 mg/dL (ref 6–23)
CO2: 28 mEq/L (ref 19–32)
Calcium: 8.9 mg/dL (ref 8.4–10.5)
Creatinine, Ser: 1.2 mg/dL (ref 0.4–1.2)
GFR: 46.65 mL/min — ABNORMAL LOW (ref >60.00)
Glucose, Bld: 151 mg/dL — ABNORMAL HIGH (ref 70–99)
Sodium: 137 mEq/L (ref 135–145)

## 2011-11-20 LAB — LIPID PANEL
Cholesterol: 158 mg/dL (ref 0–200)
HDL: 31.6 mg/dL — ABNORMAL LOW (ref >39.00)
Triglycerides: 305 mg/dL — ABNORMAL HIGH (ref 0.0–149.0)

## 2011-11-20 LAB — HEPATIC FUNCTION PANEL
Albumin: 3.9 g/dL (ref 3.5–5.2)
Alkaline Phosphatase: 68 U/L (ref 39–117)
Total Protein: 7.7 g/dL (ref 6.0–8.3)

## 2011-11-20 LAB — TSH: TSH: 9.11 u[IU]/mL — ABNORMAL HIGH (ref 0.35–5.50)

## 2011-11-20 NOTE — Patient Instructions (Signed)
Continue all other medications as before You will be contacted regarding the referral for: [facility] Please go to LAB in the Basement for the blood and/or urine tests to be done today Please go to XRAY in the Basement for the x-ray test You will be contacted by phone if any changes need to be made immediately.  Otherwise, you will receive a letter about your results with an explanation. Please see the Scheduling desk today regarding schedulingo of the Bone Density test Please return in 6 months, or sooner if needed

## 2011-11-20 NOTE — Assessment & Plan Note (Signed)
With more difficulty walking recent, - for PT as above

## 2011-11-20 NOTE — Assessment & Plan Note (Signed)
Also for dxa 

## 2011-11-20 NOTE — Assessment & Plan Note (Signed)
Exam c/w quad weakness  - for outpt rehab eval and tx

## 2011-11-20 NOTE — Assessment & Plan Note (Signed)
For f/u cxr today, unable to do last visit

## 2011-11-20 NOTE — Assessment & Plan Note (Signed)

## 2011-11-20 NOTE — Progress Notes (Signed)
Subjective:    Patient ID: Danielle Newman, female    DOB: Aug 01, 1931, 78 y.o.   MRN: [id number]  HPI  Here for wellness and f/u;  Overall doing ok;  Pt denies CP, worsening SOB, DOE, wheezing, orthopnea, PND, worsening LE edema, palpitations, dizziness or syncope.  Pt denies neurological change such as new Headache, facial or extremity weakness.  Pt denies polydipsia, polyuria, or low sugar symptoms. Pt states overall good compliance with treatment and medications, good tolerability, and trying to follow lower cholesterol diet.  Pt denies worsening depressive symptoms, suicidal ideation or panic. No fever, wt loss, night sweats, loss of appetite, or other constitutional symptoms.  Pt states good ability with ADL's, but increased fall risk in the past 6 wks due to LE general weakness and gait problem, unable to get up on exam table today, home safety reviewed and adequate, no significant changes in hearing or vision, and minimally active with exercise. Past Medical History  Diagnosis Date  . ALLERGIC RHINITIS 02/11/2008  . ANXIETY 02/11/2008  . CORONARY ARTERY DISEASE 02/11/2008  . DEPRESSION 02/11/2008  . DYSPNEA 09/11/2010  . GERD 02/11/2008  . HYPERLIPIDEMIA 02/11/2008  . HYPOTHYROIDISM 02/11/2008  . LUNG CANCER, HX OF 02/11/2008  . MACULAR DEGENERATION 03/29/2009  . OSTEOPENIA 02/11/2008  . Sarcoidosis 02/11/2008  . Sight impaired 04/05/2011  . 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 Impaired glucose tolerance 11/20/2011   Past Surgical History  Procedure Date  . S/p left upper lobectomy   . S/p arm surgury     reports that she has never smoked. She does not have any smokeless tobacco history on file. She reports that she does not drink alcohol or use illicit drugs. family history includes Allergies in her daughter and Cancer in her daughter and mother. Allergies  Allergen Reactions  . Meperidine Hcl    Current Outpatient Prescriptions on File Prior to Visit  Medication Sig  Dispense Refill  . butalbital-acetaminophen-caffeine (FIORICET) 50-325-40 MG per tablet 1 tab by mouth per day as needed for migraine  20 tablet  0  . citalopram (CELEXA) 20 MG tablet Take 1 tablet (20 mg total) by mouth daily.  90 tablet  3  . diazepam (VALIUM) 10 MG tablet Take 1 tablet (10 mg total) by mouth 2 (two) times daily as needed.  60 tablet  1  . levothyroxine (SYNTHROID) 88 MCG tablet Take 1 tablet (88 mcg total) by mouth daily.  90 tablet  3  . lovastatin (MEVACOR) 40 MG tablet Take 1 tablet (40 mg total) by mouth daily.  90 tablet  3  . aspirin 81 MG tablet Take 81 mg by mouth daily.         Review of Systems Review of Systems  Constitutional: Negative for diaphoresis, activity change, appetite change and unexpected weight change.  HENT: Negative for hearing loss, ear pain, facial swelling, mouth sores and neck stiffness.   Eyes: Negative for pain, redness and visual disturbance.  Respiratory: Negative for shortness of breath and wheezing.   Cardiovascular: Negative for chest pain and palpitations.  Gastrointestinal: Negative for diarrhea, blood in stool, abdominal distention and rectal pain.  Genitourinary: Negative for hematuria, flank pain and decreased urine volume.  Musculoskeletal: Negative for myalgias and joint swelling. except for bilat knee pain Skin: Negative for color change and wound.  Neurological: Negative for syncope and numbness.  Hematological: Negative for adenopathy.  Psychiatric/Behavioral: Negative for hallucinations, self-injury, decreased concentration and agitation.  Objective:   Physical Exam BP 120/64  Pulse 80  Temp(Src) 97.3 F (36.3 C) (Oral)  Ht 5\' 7"  (1.702 m)  Wt 240 lb 4 oz (108.977 kg)  BMI 37.63 kg/m2  SpO2 94% Physical Exam  VS noted Constitutional: Pt is oriented to person, place, and time. Appears well-developed and well-nourished.  HENT:  Head: Normocephalic and atraumatic.  Right Ear: External ear normal.  Left Ear:  External ear normal.  Nose: Nose normal.  Mouth/Throat: Oropharynx is clear and moist.  Eyes: Conjunctivae and EOM are normal. Pupils are equal, round, and reactive to light.  Neck: Normal range of motion. Neck supple. No JVD present. No tracheal deviation present.  Cardiovascular: Normal rate, regular rhythm, normal heart sounds and intact distal pulses.   Pulmonary/Chest: Effort normal and breath sounds normal.  Abdominal: Soft. Bowel sounds are normal. There is no tenderness.  Musculoskeletal: Normal range of motion. Exhibits no edema.  Lymphadenopathy:  Has no cervical adenopathy.  Neurological: Pt is alert and oriented to person, place, and time. Pt has normal reflexes. No cranial nerve deficit. Gait ok but to weak to step up to exam table with general LE weakness  Skin: Skin is warm and dry. No rash noted.  Psychiatric:  Has  normal mood and affect. Behavior is normal. Not depressed affect or overaly nervous    Assessment & Plan:

## 2011-11-21 ENCOUNTER — Encounter: Payer: Self-pay | Admitting: Internal Medicine

## 2011-11-21 ENCOUNTER — Other Ambulatory Visit: Payer: Self-pay | Admitting: Internal Medicine

## 2011-11-21 DIAGNOSIS — E038 Other specified hypothyroidism: Secondary | ICD-10-CM

## 2011-11-21 DIAGNOSIS — E119 Type 2 diabetes mellitus without complications: Secondary | ICD-10-CM

## 2011-11-21 HISTORY — DX: Type 2 diabetes mellitus without complications: E11.9

## 2011-11-21 MED ORDER — LEVOTHYROXINE SODIUM 100 MCG PO TABS: 100.0000 ug | ORAL_TABLET | Freq: Every day | ORAL | Status: DC

## 2011-11-27 ENCOUNTER — Telehealth: Payer: Self-pay | Admitting: Internal Medicine

## 2011-11-27 MED ORDER — ALENDRONATE SODIUM 70 MG PO TABS: 70.0000 mg | ORAL_TABLET | ORAL | Status: DC

## 2011-11-27 NOTE — Telephone Encounter (Signed)
Fosamax done escript

## 2011-11-27 NOTE — Telephone Encounter (Signed)
Message copied by Corwin Levins on Tue Nov 27, 2011  4:08 PM ------      Message from: Scharlene Gloss B      Created: Tue Nov 27, 2011  3:50 PM       Called the patient back. She would like to start fosamax. Please send to her pharmacy

## 2012-04-03 ENCOUNTER — Telehealth: Payer: Self-pay | Admitting: Internal Medicine

## 2012-04-03 NOTE — Telephone Encounter (Signed)
Sorry, office policy is that OV is needed

## 2012-04-03 NOTE — Telephone Encounter (Signed)
Caller: Odaly/Patient; Patient Name: Danielle Newman; PCP: Oliver Barre (Adults only); Best Callback Phone Number: [phone] Onset-03/31/12  Afebrile. Pt has a productive cough and is requesting an antibiiotic. Emergent s/s of Cough protocol r/o. Pt to see provider within 24hrs. Pt states she almost blind and wants a message sent to provider to see if an antibiotic can be sent without an appointment.   [facility] [phone].

## 2012-04-07 NOTE — Telephone Encounter (Signed)
Called the patient informed of MD's instructions on OV.

## 2012-05-02 ENCOUNTER — Other Ambulatory Visit: Payer: Self-pay | Admitting: Internal Medicine

## 2012-05-21 ENCOUNTER — Ambulatory Visit: Payer: Medicare PPO | Admitting: Internal Medicine

## 2012-05-26 ENCOUNTER — Ambulatory Visit: Payer: Medicare PPO | Admitting: Internal Medicine

## 2012-06-04 ENCOUNTER — Ambulatory Visit: Payer: Medicare PPO | Admitting: Internal Medicine

## 2012-06-04 DIAGNOSIS — Z0289 Encounter for other administrative examinations: Secondary | ICD-10-CM

## 2012-06-15 ENCOUNTER — Other Ambulatory Visit: Payer: Self-pay | Admitting: Internal Medicine

## 2012-08-04 ENCOUNTER — Other Ambulatory Visit: Payer: Self-pay | Admitting: Internal Medicine

## 2012-08-11 ENCOUNTER — Ambulatory Visit: Payer: Medicare PPO | Admitting: Internal Medicine

## 2012-08-11 DIAGNOSIS — Z0289 Encounter for other administrative examinations: Secondary | ICD-10-CM

## 2012-08-14 ENCOUNTER — Encounter: Payer: Self-pay | Admitting: Internal Medicine

## 2012-08-14 ENCOUNTER — Ambulatory Visit (INDEPENDENT_AMBULATORY_CARE_PROVIDER_SITE_OTHER): Payer: Medicare PPO | Admitting: Internal Medicine

## 2012-08-14 VITALS — BP 114/62 | HR 74 | Temp 97.6°F | Ht 67.0 in | Wt 225.0 lb

## 2012-08-14 DIAGNOSIS — Z23 Encounter for immunization: Secondary | ICD-10-CM

## 2012-08-14 DIAGNOSIS — M899 Disorder of bone, unspecified: Secondary | ICD-10-CM

## 2012-08-14 DIAGNOSIS — E119 Type 2 diabetes mellitus without complications: Secondary | ICD-10-CM

## 2012-08-14 DIAGNOSIS — M949 Disorder of cartilage, unspecified: Secondary | ICD-10-CM

## 2012-08-14 DIAGNOSIS — E785 Hyperlipidemia, unspecified: Secondary | ICD-10-CM

## 2012-08-14 DIAGNOSIS — K219 Gastro-esophageal reflux disease without esophagitis: Secondary | ICD-10-CM

## 2012-08-14 DIAGNOSIS — R1011 Right upper quadrant pain: Secondary | ICD-10-CM

## 2012-08-14 MED ORDER — OMEPRAZOLE 20 MG PO CPDR: 20.0000 mg | DELAYED_RELEASE_CAPSULE | Freq: Every day | ORAL | Status: DC

## 2012-08-14 NOTE — Assessment & Plan Note (Signed)
Minor per pt, exam benign, pt delcines further eval such as LFT's and u/s

## 2012-08-14 NOTE — Assessment & Plan Note (Signed)
ECG reviewed as per emr,stable overall by history and exam, recent data reviewed with pt, and pt to continue medical treatment as before,  to f/u any worsening symptoms or concerns Lab Results  Component Value Date   HGBA1C 6.7* 11/20/2011

## 2012-08-14 NOTE — Assessment & Plan Note (Signed)
To start prilosec 20 1qd,   to f/u any worsening symptoms or concerns

## 2012-08-14 NOTE — Patient Instructions (Addendum)
You had the flu shot today Your EKG was OK today You are given the copy of your April 2013 lab work Please continue your efforts at being more active, low cholesterol diet, and weight control. No blood work needed today You are given the prescription for the cane, and the handicap parking form Please take all new medication as prescribed  - the prilosec 20 mg per day Please continue all other medications as before, and refills have been done if requested. Please have the pharmacy call with any other refills you may need. Thank you for enrolling in MyChart. Please follow the instructions below to securely access your online medical record. MyChart allows you to send messages to your doctor, view your test results, renew your prescriptions, schedule appointments, and more. To Log into My Chart online, please go by [employer] or [employer] to [email or web address], or download the MyChart App from the [employer] of [employer] .  Your Username is: [email or web address],  Pass freer

## 2012-08-14 NOTE — Progress Notes (Signed)
Subjective:    Patient ID: Danielle Newman, female    DOB: [birth year], 79 y.o.   MRN: [id number]  HPI here with daughter, pt with blind right eye/marked reduced vision left but has really been working on lifestyle changes with better diet/reduced carbs and 15 lb intentional wt loss since last visit.  Has had significant reflux but no dysphagia, n/v, bowel change or blood. Stopepd her fosamax - ? ? Made worse.  Needs handicap parking form, as well as rx for cane to help with ambulation.   Pt denies polydipsia, polyuria, or low sugar symptoms such as weakness or confusion improved with po intake.  Pt states overall good compliance with meds, trying to follow lower cholesterol, diabetic diet, wt overall stable but little exercise however.   Also mentions vague minor RUQ pain without radiation, intemittent, without wt loss, fever, still has GB Past Medical History  Diagnosis Date  . ALLERGIC RHINITIS 02/11/2008  . ANXIETY 02/11/2008  . CORONARY ARTERY DISEASE 02/11/2008  . DEPRESSION 02/11/2008  . DYSPNEA 09/11/2010  . GERD 02/11/2008  . HYPERLIPIDEMIA 02/11/2008  . HYPOTHYROIDISM 02/11/2008  . LUNG CANCER, HX OF 02/11/2008  . MACULAR DEGENERATION 03/29/2009  . OSTEOPENIA 02/11/2008  . Sarcoidosis 02/11/2008  . Sight impaired 04/05/2011  . 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 Impaired glucose tolerance 11/20/2011  . DM (diabetes mellitus) 11/21/2011   Past Surgical History  Procedure Date  . S/p left upper lobectomy   . S/p arm surgury     reports that she has never smoked. She does not have any smokeless tobacco history on file. She reports that she does not drink alcohol or use illicit drugs. family history includes Allergies in her daughter and Cancer in her daughter and mother. Allergies  Allergen Reactions  . Meperidine Hcl    Current Outpatient Prescriptions on File Prior to Visit  Medication Sig Dispense Refill  . aspirin 81 MG tablet Take 81 mg by mouth daily.        .  butalbital-acetaminophen-caffeine (FIORICET) 50-325-40 MG per tablet 1 tab by mouth per day as needed for migraine  20 tablet  0  . citalopram (CELEXA) 20 MG tablet TAKE ONE TABLET BY MOUTH ONCE A DAY  90 tablet  2  . diazepam (VALIUM) 10 MG tablet Take 1 tablet (10 mg total) by mouth 2 (two) times daily as needed.  60 tablet  1  . levothyroxine (SYNTHROID, LEVOTHROID) 100 MCG tablet TAKE 1 TABLET BY MOUTH DAILY  90 tablet  0  . lovastatin (MEVACOR) 40 MG tablet TAKE ONE TABLET BY MOUTH DAILY  90 tablet  1  . omeprazole (PRILOSEC) 20 MG capsule Take 1 capsule (20 mg total) by mouth daily.  90 capsule  3   Review of Systems  Constitutional: Negative for unexpected weight change, or unusual diaphoresis  HENT: Negative for tinnitus.   Eyes: Negative for photophobia and visual disturbance.  Respiratory: Negative for choking and stridor.   Gastrointestinal: Negative for vomiting and blood in stool.  Genitourinary: Negative for hematuria and decreased urine volume.  Musculoskeletal: Negative for acute joint swelling Skin: Negative for color change and wound.  Neurological: Negative for tremors and numbness other than noted  Psychiatric/Behavioral: Negative for decreased concentration or  hyperactivity.       Objective:   Physical Exam BP 114/62  Pulse 74  Temp 97.6 F (36.4 C) (Oral)  Ht 5\' 7"  (1.702 m)  Wt 225 lb (102.059 kg)  BMI  35.24 kg/m2  SpO2 95% VS noted,  Constitutional: Pt appears well-developed and well-nourished.  HENT: Head: NCAT.  Right Ear: External ear normal.  Left Ear: External ear normal.  Eyes: Conjunctivae and EOM are normal. Pupils are equal, round, and reactive to light.  Neck: Normal range of motion. Neck supple.  Cardiovascular: Normal rate and regular rhythm.   Pulmonary/Chest: Effort normal and breath sounds normal.  Abd:  Soft, NT, non-distended, + BS Neurological: Pt is alert. Not confused  Skin: Skin is warm. No erythema.  Psychiatric: Pt behavior is  normal. Thought content normal.     Assessment & Plan:

## 2012-08-14 NOTE — Assessment & Plan Note (Signed)
Declines further fosamax, for f/u dxa in 2-3 yrs, consider prolia

## 2012-10-16 ENCOUNTER — Ambulatory Visit (INDEPENDENT_AMBULATORY_CARE_PROVIDER_SITE_OTHER): Payer: Medicare PPO | Admitting: Internal Medicine

## 2012-10-16 ENCOUNTER — Ambulatory Visit (INDEPENDENT_AMBULATORY_CARE_PROVIDER_SITE_OTHER): Payer: Medicare PPO

## 2012-10-16 ENCOUNTER — Encounter: Payer: Self-pay | Admitting: Internal Medicine

## 2012-10-16 VITALS — BP 110/62 | HR 82 | Temp 98.2°F | Ht 67.0 in | Wt 226.1 lb

## 2012-10-16 DIAGNOSIS — Z0000 Encounter for general adult medical examination without abnormal findings: Secondary | ICD-10-CM

## 2012-10-16 DIAGNOSIS — E785 Hyperlipidemia, unspecified: Secondary | ICD-10-CM

## 2012-10-16 LAB — BASIC METABOLIC PANEL
GFR: 49.92 mL/min — ABNORMAL LOW (ref >60.00)
Potassium: 4.8 mEq/L (ref 3.5–5.1)
Sodium: 137 mEq/L (ref 135–145)

## 2012-10-16 LAB — CBC WITH DIFFERENTIAL/PLATELET
Eosinophils Relative: 2.1 % (ref 0.0–5.0)
HCT: 39.7 % (ref 36.0–46.0)
Lymphs Abs: 1.7 10*3/uL (ref 0.7–4.0)
MCHC: 33.8 g/dL (ref 30.0–36.0)
MCV: 87.5 fl (ref 78.0–100.0)
Monocytes Absolute: 0.7 10*3/uL (ref 0.1–1.0)
Platelets: 319 10*3/uL (ref 150.0–400.0)
RDW: 14.6 % (ref 11.5–14.6)
WBC: 8.9 10*3/uL (ref 4.5–10.5)

## 2012-10-16 LAB — HEPATIC FUNCTION PANEL
AST: 21 U/L (ref 0–37)
Alkaline Phosphatase: 72 U/L (ref 39–117)
Bilirubin, Direct: 0.1 mg/dL (ref 0.0–0.3)
Total Bilirubin: 0.6 mg/dL (ref 0.3–1.2)

## 2012-10-16 LAB — URINALYSIS, ROUTINE W REFLEX MICROSCOPIC
Bilirubin Urine: NEGATIVE
Ketones, ur: NEGATIVE
Leukocytes, UA: NEGATIVE
Total Protein, Urine: NEGATIVE
pH: 6.5 (ref 5.0–8.0)

## 2012-10-16 LAB — LIPID PANEL
HDL: 22.7 mg/dL — ABNORMAL LOW (ref >39.00)
Total CHOL/HDL Ratio: 7
VLDL: 76.4 mg/dL — ABNORMAL HIGH (ref 0.0–40.0)

## 2012-10-16 LAB — LDL CHOLESTEROL, DIRECT: Direct LDL: 68.3 mg/dL

## 2012-10-16 MED ORDER — OMEPRAZOLE 20 MG PO CPDR: 20.0000 mg | DELAYED_RELEASE_CAPSULE | Freq: Every day | ORAL | Status: DC

## 2012-10-16 MED ORDER — LOVASTATIN 40 MG PO TABS: ORAL_TABLET | ORAL | Status: DC

## 2012-10-16 MED ORDER — CITALOPRAM HYDROBROMIDE 20 MG PO TABS: ORAL_TABLET | ORAL | Status: DC

## 2012-10-16 MED ORDER — LEVOTHYROXINE SODIUM 100 MCG PO TABS: ORAL_TABLET | ORAL | Status: DC

## 2012-10-16 NOTE — Progress Notes (Signed)
Subjective:    Patient ID: Danielle Newman, female    DOB: 1931/08/29, 79 y.o.   MRN: [id number]  HPI  /Here for wellness and f/u;  Overall doing ok;  Pt denies CP, worsening SOB, DOE, wheezing, orthopnea, PND, worsening LE edema, palpitations, dizziness or syncope.  Pt denies neurological change such as new headache, facial or extremity weakness.  Pt denies polydipsia, polyuria, or low sugar symptoms. Pt states overall good compliance with treatment and medications, good tolerability, and has been trying to follow lower cholesterol diet.  Pt denies worsening depressive symptoms, suicidal ideation or panic. No fever, night sweats, wt loss, loss of appetite, or other constitutional symptoms.  Pt states good ability with ADL's, has low fall risk, home safety reviewed and adequate, no other significant changes in hearing or vision, and only occasionally active with exercise, has some prox leg weakness but still trying to walk daily, very hard due to vision loss Past Medical History  Diagnosis Date  . ALLERGIC RHINITIS 02/11/2008  . ANXIETY 02/11/2008  . CORONARY ARTERY DISEASE 02/11/2008  . DEPRESSION 02/11/2008  . DYSPNEA 09/11/2010  . GERD 02/11/2008  . HYPERLIPIDEMIA 02/11/2008  . HYPOTHYROIDISM 02/11/2008  . LUNG CANCER, HX OF 02/11/2008  . MACULAR DEGENERATION 03/29/2009  . OSTEOPENIA 02/11/2008  . Sarcoidosis 02/11/2008  . Sight impaired 04/05/2011  . 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 Impaired glucose tolerance 11/20/2011  . DM (diabetes mellitus) 11/21/2011   Past Surgical History  Procedure Laterality Date  . S/p left upper lobectomy    . S/p arm surgury      reports that she has never smoked. She does not have any smokeless tobacco history on file. She reports that she does not drink alcohol or use illicit drugs. family history includes Allergies in her daughter and Cancer in her daughter and mother. Allergies  Allergen Reactions  . Meperidine Hcl    Current Outpatient  Prescriptions on File Prior to Visit  Medication Sig Dispense Refill  . aspirin 81 MG tablet Take 81 mg by mouth daily.        . butalbital-acetaminophen-caffeine (FIORICET) 50-325-40 MG per tablet 1 tab by mouth per day as needed for migraine  20 tablet  0  . diazepam (VALIUM) 10 MG tablet Take 1 tablet (10 mg total) by mouth 2 (two) times daily as needed.  60 tablet  1   No current facility-administered medications on file prior to visit.   Review of Systems Constitutional: Negative for diaphoresis, activity change, appetite change or unexpected weight change.  HENT: Negative for hearing loss, ear pain, facial swelling, mouth sores and neck stiffness.   Eyes: Negative for pain, redness and visual disturbance.  Respiratory: Negative for shortness of breath and wheezing.   Cardiovascular: Negative for chest pain and palpitations.  Gastrointestinal: Negative for diarrhea, blood in stool, abdominal distention or other pain Genitourinary: Negative for hematuria, flank pain or change in urine volume.  Musculoskeletal: Negative for myalgias and joint swelling.  Skin: Negative for color change and wound.  Neurological: Negative for syncope and numbness. other than noted Hematological: Negative for adenopathy.  Psychiatric/Behavioral: Negative for hallucinations, self-injury, decreased concentration and agitation.      Objective:   Physical Exam BP 110/62  Pulse 82  Temp(Src) 98.2 F (36.8 C) (Oral)  Ht 5\' 7"  (1.702 m)  Wt 226 lb 2 oz (102.57 kg)  BMI 35.41 kg/m2  SpO2 95% VS noted,  Constitutional: Pt is oriented to person, place,  and time. Appears well-developed and well-nourished.  Head: Normocephalic and atraumatic.  Right Ear: External ear normal.  Left Ear: External ear normal.  Nose: Nose normal.  Mouth/Throat: Oropharynx is clear and moist.  Eyes: Conjunctivae and EOM are normal. Pupils are equal, round, and reactive to light.  Neck: Normal range of motion. Neck supple. No  JVD present. No tracheal deviation present.  Cardiovascular: Normal rate, regular rhythm, normal heart sounds and intact distal pulses.   Pulmonary/Chest: Effort normal and breath sounds normal.  Abdominal: Soft. Bowel sounds are normal. There is no tenderness. No HSM  Musculoskeletal: Normal range of motion. Exhibits no edema.  Lymphadenopathy:  Has no cervical adenopathy.  Neurological: Pt is alert and oriented to person, place, and time. Pt has normal reflexes. No cranial nerve deficit except for 100% right blind, does have some retained vision left lateral field  Skin: Skin is warm and dry. No rash noted.  Psychiatric:  Has  normal mood and affect. Behavior is normal.     Assessment & Plan:

## 2012-10-16 NOTE — Patient Instructions (Addendum)
Please continue all other medications as before, and refills have been done as requested (all done except for the valium and fioricet) Please have the pharmacy call with any other refills you may need. Please continue your efforts at being more active, low cholesterol diet, and weight control. You are otherwise up to date with prevention measures today. Please keep your appointments with your specialists as you do Please go to the LAB in the Basement (turn left off the elevator) for the tests to be done today You will be contacted by phone if any changes need to be made immediately.  Otherwise, you will receive a letter about your results with an explanation, but please check with MyChart first. Thank you for enrolling in MyChart. Please follow the instructions below to securely access your online medical record. MyChart allows you to send messages to your doctor, view your test results, renew your prescriptions, schedule appointments, and more. Please return in 6 months, or sooner if needed

## 2012-10-17 LAB — TSH: TSH: 3.9 u[IU]/mL (ref 0.35–5.50)

## 2013-04-23 ENCOUNTER — Encounter: Payer: Self-pay | Admitting: Internal Medicine

## 2013-04-23 ENCOUNTER — Ambulatory Visit (INDEPENDENT_AMBULATORY_CARE_PROVIDER_SITE_OTHER): Payer: Medicare PPO | Admitting: Internal Medicine

## 2013-04-23 VITALS — BP 102/68 | HR 84 | Temp 99.0°F | Ht 67.0 in | Wt 222.0 lb

## 2013-04-23 DIAGNOSIS — G43909 Migraine, unspecified, not intractable, without status migrainosus: Secondary | ICD-10-CM

## 2013-04-23 DIAGNOSIS — E785 Hyperlipidemia, unspecified: Secondary | ICD-10-CM

## 2013-04-23 DIAGNOSIS — Z23 Encounter for immunization: Secondary | ICD-10-CM

## 2013-04-23 DIAGNOSIS — F329 Major depressive disorder, single episode, unspecified: Secondary | ICD-10-CM

## 2013-04-23 DIAGNOSIS — E119 Type 2 diabetes mellitus without complications: Secondary | ICD-10-CM

## 2013-04-23 DIAGNOSIS — K219 Gastro-esophageal reflux disease without esophagitis: Secondary | ICD-10-CM

## 2013-04-23 MED ORDER — BUTALBITAL-APAP-CAFFEINE 50-325-40 MG PO TABS: ORAL_TABLET | ORAL | Status: DC

## 2013-04-23 NOTE — Progress Notes (Signed)
Subjective:    Patient ID: Danielle Newman, female    DOB: 07-11-1932, 79 y.o.   MRN: [id number]  HPI  Here to f/u; overall doing ok,  Pt denies chest pain, increased sob or doe, wheezing, orthopnea, PND, increased LE swelling, palpitations, dizziness or syncope.  Pt denies polydipsia, polyuria, or low sugar symptoms such as weakness or confusion improved with po intake.  Pt denies new neurological symptoms such as new headache, or facial or extremity weakness or numbness.   Pt states overall good compliance with meds, has been trying to follow lower cholesterol, diabetic diet, with wt overall stable,  but little exercise however.  Denies worsening reflux, abd pain, dysphagia, n/v, bowel change or blood. Quit smoking x 19 yrs. Denies worsening depressive symptoms, suicidal ideation, or panic.  Asks for refill fioricet as works well for her migraine, only has migraine up to 1-2 /mo Past Medical History  Diagnosis Date  . ALLERGIC RHINITIS 02/11/2008  . ANXIETY 02/11/2008  . CORONARY ARTERY DISEASE 02/11/2008  . DEPRESSION 02/11/2008  . DYSPNEA 09/11/2010  . GERD 02/11/2008  . HYPERLIPIDEMIA 02/11/2008  . HYPOTHYROIDISM 02/11/2008  . LUNG CANCER, HX OF 02/11/2008  . MACULAR DEGENERATION 03/29/2009  . OSTEOPENIA 02/11/2008  . Sarcoidosis 02/11/2008  . Sight impaired 04/05/2011  . 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 Impaired glucose tolerance 11/20/2011  . DM (diabetes mellitus) 11/21/2011   Past Surgical History  Procedure Laterality Date  . S/p left upper lobectomy    . S/p arm surgury      reports that she has never smoked. She does not have any smokeless tobacco history on file. She reports that she does not drink alcohol or use illicit drugs. family history includes Allergies in her daughter; Cancer in her daughter and mother. Allergies  Allergen Reactions  . Meperidine Hcl    Current Outpatient Prescriptions on File Prior to Visit  Medication Sig Dispense Refill  . aspirin 81  MG tablet Take 81 mg by mouth daily.        . citalopram (CELEXA) 20 MG tablet TAKE ONE TABLET BY MOUTH ONCE A DAY  90 tablet  3  . diazepam (VALIUM) 10 MG tablet Take 1 tablet (10 mg total) by mouth 2 (two) times daily as needed.  60 tablet  1  . levothyroxine (SYNTHROID, LEVOTHROID) 100 MCG tablet TAKE 1 TABLET BY MOUTH DAILY  90 tablet  3  . lovastatin (MEVACOR) 40 MG tablet TAKE ONE TABLET BY MOUTH DAILY  90 tablet  3  . omeprazole (PRILOSEC) 20 MG capsule Take 1 capsule (20 mg total) by mouth daily.  90 capsule  3   No current facility-administered medications on file prior to visit.    Review of Systems  Constitutional: Negative for unexpected weight change, or unusual diaphoresis  HENT: Negative for tinnitus.   Eyes: Negative for photophobia and visual disturbance.  Respiratory: Negative for choking and stridor.   Gastrointestinal: Negative for vomiting and blood in stool.  Genitourinary: Negative for hematuria and decreased urine volume.  Musculoskeletal: Negative for acute joint swelling Skin: Negative for color change and wound.  Neurological: Negative for tremors and numbness other than noted  Psychiatric/Behavioral: Negative for decreased concentration or  hyperactivity.       Objective:   Physical Exam BP 102/68  Pulse 84  Temp(Src) 99 F (37.2 C) (Oral)  Ht 5\' 7"  (1.702 m)  Wt 222 lb (100.699 kg)  BMI 34.76 kg/m2  SpO2 94%  VS noted,  Constitutional: Pt appears well-developed and well-nourished.  HENT: Head: NCAT.  Right Ear: External ear normal.  Left Ear: External ear normal.  Eyes: Conjunctivae and EOM are normal. Pupils are equal, round, and reactive to light.  Neck: Normal range of motion. Neck supple.  Cardiovascular: Normal rate and regular rhythm.   Pulmonary/Chest: Effort normal and breath sounds normal.  Abd:  Soft, NT, non-distended, + BS Neurological: Pt is alert. Not confused  Skin: Skin is warm. No erythema.  Psychiatric: Pt behavior is normal.  Thought content normal. not depressed affect    Assessment & Plan:

## 2013-04-23 NOTE — Assessment & Plan Note (Signed)
stable overall by history and exam, recent data reviewed with pt, and pt to continue medical treatment as before,  to f/u any worsening symptoms or concerns Lab Results  Component Value Date   HGBA1C 6.7* 11/20/2011    

## 2013-04-23 NOTE — Patient Instructions (Addendum)
You had the flu shot today, and the Prevnar (pneumonia) shot Please continue all other medications as before, and refills have been done if requested - the fioricet Please have the pharmacy call with any other refills you may need. We can hold off on blood work today  Please remember to sign up for My Chart if you have not done so, as this will be important to you in the future with finding out test results, communicating by private email, and scheduling acute appointments online when needed.  Please return in 6 months, or sooner if needed

## 2013-04-23 NOTE — Assessment & Plan Note (Signed)
stable overall by history and exam, , and pt to continue medical treatment as before,  to f/u any worsening symptoms or concerns  

## 2013-04-23 NOTE — Assessment & Plan Note (Signed)
stable overall by history and exam, recent data reviewed with pt, and pt to continue medical treatment as before,  to f/u any worsening symptoms or concerns Lab Results  Component Value Date   CHOL 151 10/16/2012   HDL 22.70* 10/16/2012   LDLDIRECT 68.3 10/16/2012   TRIG 382.0* 10/16/2012   CHOLHDL 7 10/16/2012   To cont lower choll diet

## 2013-04-23 NOTE — Assessment & Plan Note (Signed)
stable overall by history and exam, recent data reviewed with pt, and pt to continue medical treatment as before,  to f/u any worsening symptoms or concerns Lab Results  Component Value Date   WBC 8.9 10/16/2012   HGB 13.4 10/16/2012   HCT 39.7 10/16/2012   PLT 319.0 10/16/2012   GLUCOSE 94 10/16/2012   CHOL 151 10/16/2012   TRIG 382.0* 10/16/2012   HDL 22.70* 10/16/2012   LDLDIRECT 68.3 10/16/2012   ALT 18 10/16/2012   AST 21 10/16/2012   NA 137 10/16/2012   K 4.8 10/16/2012   CL 98 10/16/2012   CREATININE 1.1 10/16/2012   BUN 18 10/16/2012   CO2 31 10/16/2012   TSH 3.90 10/16/2012   HGBA1C 6.7* 11/20/2011    

## 2013-04-23 NOTE — Assessment & Plan Note (Signed)
stable overall by history and exam, and pt to continue medical treatment as before,  to f/u any worsening symptoms or concerns 

## 2013-08-24 ENCOUNTER — Other Ambulatory Visit: Payer: Self-pay | Admitting: Internal Medicine

## 2013-10-14 ENCOUNTER — Ambulatory Visit (INDEPENDENT_AMBULATORY_CARE_PROVIDER_SITE_OTHER): Payer: Medicare PPO | Admitting: Internal Medicine

## 2013-10-14 ENCOUNTER — Encounter: Payer: Self-pay | Admitting: Internal Medicine

## 2013-10-14 VITALS — BP 120/68 | HR 78 | Temp 97.2°F | Wt 226.8 lb

## 2013-10-14 DIAGNOSIS — E119 Type 2 diabetes mellitus without complications: Secondary | ICD-10-CM

## 2013-10-14 DIAGNOSIS — Z0000 Encounter for general adult medical examination without abnormal findings: Secondary | ICD-10-CM

## 2013-10-14 MED ORDER — LEVOTHYROXINE SODIUM 100 MCG PO TABS: 100.0000 ug | ORAL_TABLET | Freq: Every day | ORAL | Status: DC

## 2013-10-14 NOTE — Patient Instructions (Addendum)
OK to take colace OTC 100 mg up to twice per day for stool softner  Please continue all other medications as before, and refills have been done if requested. Please have the pharmacy call with any other refills you may need.  Please continue your efforts at being more active, low cholesterol diet, and weight control. You are otherwise up to date with prevention measures today.  You will be contacted regarding the referral for: mammogram Please call if you change your mind about the colonoscopy  Please keep your appointments with your specialists as you may have planned  Please return in 6 months, or sooner if needed

## 2013-10-14 NOTE — Assessment & Plan Note (Signed)

## 2013-10-14 NOTE — Progress Notes (Signed)
Pre visit review using our clinic review tool, if applicable. No additional management support is needed unless otherwise documented below in the visit note. 

## 2013-10-14 NOTE — Progress Notes (Signed)
Subjective:    Patient ID: Danielle CastleConstance Newman, female    DOB: 10-26-1931, 80 y.o.   MRN: [id number]  HPI Here for wellness and f/u;  Overall doing ok;  Pt denies CP, worsening SOB, DOE, wheezing, orthopnea, PND, worsening LE edema, palpitations, dizziness or syncope.  Pt denies neurological change such as new headache, facial or extremity weakness.  Pt denies polydipsia, polyuria, or low sugar symptoms. Pt states overall good compliance with treatment and medications, good tolerability, and has been trying to follow lower cholesterol diet.  Pt denies worsening depressive symptoms, suicidal ideation or panic. No fever, night sweats, wt loss, loss of appetite, or other constitutional symptoms.  Pt states good ability with ADL's, has mod to high fall risk due to low vision, home safety reviewed and adequate, no other significant changes in hearing or vision, and no active with exercise due to low vision.  No new complaints Past Medical History  Diagnosis Date  . ALLERGIC RHINITIS 02/11/2008  . ANXIETY 02/11/2008  . CORONARY ARTERY DISEASE 02/11/2008  . DEPRESSION 02/11/2008  . DYSPNEA 09/11/2010  . GERD 02/11/2008  . HYPERLIPIDEMIA 02/11/2008  . HYPOTHYROIDISM 02/11/2008  . LUNG CANCER, HX OF 02/11/2008  . MACULAR DEGENERATION 03/29/2009  . OSTEOPENIA 02/11/2008  . Sarcoidosis 02/11/2008  . Sight impaired 04/05/2011  . 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 Impaired glucose tolerance 11/20/2011  . DM (diabetes mellitus) 11/21/2011   Past Surgical History  Procedure Laterality Date  . S/p left upper lobectomy    . S/p arm surgury      reports that she has never smoked. She does not have any smokeless tobacco history on file. She reports that she does not drink alcohol or use illicit drugs. family history includes Allergies in her daughter; Cancer in her daughter and mother. Allergies  Allergen Reactions  . Meperidine Hcl    Current Outpatient Prescriptions on File Prior to Visit    Medication Sig Dispense Refill  . aspirin 81 MG tablet Take 81 mg by mouth daily.        . butalbital-acetaminophen-caffeine (FIORICET) 50-325-40 MG per tablet 1 tab by mouth per day as needed for migraine  20 tablet  0  . citalopram (CELEXA) 20 MG tablet TAKE ONE TABLET BY MOUTH ONCE A DAY  90 tablet  3  . diazepam (VALIUM) 10 MG tablet Take 1 tablet (10 mg total) by mouth 2 (two) times daily as needed.  60 tablet  1  . lovastatin (MEVACOR) 40 MG tablet TAKE ONE TABLET BY MOUTH DAILY  90 tablet  3  . omeprazole (PRILOSEC) 20 MG capsule Take 1 capsule (20 mg total) by mouth daily.  90 capsule  3   No current facility-administered medications on file prior to visit.   Declines colonoscopy. Due for mammogram.  Has some occas hard stools.  Review of Systems Constitutional: Negative for diaphoresis, activity change, appetite change or unexpected weight change.  HENT: Negative for hearing loss, ear pain, facial swelling, mouth sores and neck stiffness.   Eyes: Negative for pain, redness and visual disturbance.  Respiratory: Negative for shortness of breath and wheezing.   Cardiovascular: Negative for chest pain and palpitations.  Gastrointestinal: Negative for diarrhea, blood in stool, abdominal distention or other pain Genitourinary: Negative for hematuria, flank pain or change in urine volume.  Musculoskeletal: Negative for myalgias and joint swelling.  Skin: Negative for color change and wound.  Neurological: Negative for syncope and numbness. other than noted  Hematological: Negative for adenopathy.  Psychiatric/Behavioral: Negative for hallucinations, self-injury, decreased concentration and agitation.      Objective:   Physical Exam BP 120/68  Pulse 78  Temp(Src) 97.2 F (36.2 C) (Oral)  Wt 226 lb 12 oz (102.853 kg)  SpO2 94% VS noted,  Constitutional: Pt is oriented to person, place, and time. Appears well-developed and well-nourished.  Head: Normocephalic and atraumatic.   Right Ear: External ear normal.  Left Ear: External ear normal.  Nose: Nose normal.  Mouth/Throat: Oropharynx is clear and moist.  Eyes: Conjunctivae and EOM are normal. Pupils are equal, round, and reactive to light.  Neck: Normal range of motion. Neck supple. No JVD present. No tracheal deviation present.  Cardiovascular: Normal rate, regular rhythm, normal heart sounds and intact distal pulses.   Pulmonary/Chest: Effort normal and breath sounds normal.  Abdominal: Soft. Bowel sounds are normal. There is no tenderness. No HSM  Musculoskeletal: Normal range of motion. Exhibits no edema.  Lymphadenopathy:  Has no cervical adenopathy.  Neurological: Pt is alert and oriented to person, place, and time. Pt has normal reflexes. No cranial nerve deficit. Except blind right eye, and low vision left as well Skin: Skin is warm and dry. No rash noted.  Psychiatric:  Has mild nervous mood and affect. Behavior is normal.     Assessment & Plan:

## 2013-10-14 NOTE — Assessment & Plan Note (Signed)
stable overall by history and exam, recent data reviewed with pt, and pt to continue medical treatment as before,  to f/u any worsening symptoms or concerns Lab Results  Component Value Date   HGBA1C 6.7* 11/20/2011

## 2013-10-21 ENCOUNTER — Ambulatory Visit: Payer: Medicare PPO | Admitting: Internal Medicine

## 2013-10-23 ENCOUNTER — Telehealth: Payer: Self-pay

## 2013-10-23 NOTE — Telephone Encounter (Signed)
Relevant patient education mailed to patient.  

## 2013-12-22 ENCOUNTER — Other Ambulatory Visit: Payer: Self-pay | Admitting: Internal Medicine

## 2013-12-24 ENCOUNTER — Other Ambulatory Visit: Payer: Self-pay | Admitting: Internal Medicine

## 2014-04-15 ENCOUNTER — Ambulatory Visit: Payer: Medicare PPO | Admitting: Internal Medicine

## 2014-04-27 ENCOUNTER — Ambulatory Visit: Payer: Medicare PPO | Admitting: Internal Medicine

## 2014-05-04 ENCOUNTER — Ambulatory Visit: Payer: Medicare PPO | Admitting: Internal Medicine

## 2014-05-12 ENCOUNTER — Encounter: Payer: Self-pay | Admitting: Internal Medicine

## 2014-05-12 ENCOUNTER — Ambulatory Visit (INDEPENDENT_AMBULATORY_CARE_PROVIDER_SITE_OTHER): Payer: Medicare PPO | Admitting: Internal Medicine

## 2014-05-12 VITALS — BP 108/70 | HR 88 | Temp 98.3°F | Ht 67.0 in | Wt 226.1 lb

## 2014-05-12 DIAGNOSIS — Z23 Encounter for immunization: Secondary | ICD-10-CM

## 2014-05-12 DIAGNOSIS — E119 Type 2 diabetes mellitus without complications: Secondary | ICD-10-CM

## 2014-05-12 DIAGNOSIS — F32A Depression, unspecified: Secondary | ICD-10-CM

## 2014-05-12 DIAGNOSIS — F329 Major depressive disorder, single episode, unspecified: Secondary | ICD-10-CM

## 2014-05-12 DIAGNOSIS — E785 Hyperlipidemia, unspecified: Secondary | ICD-10-CM

## 2014-05-12 DIAGNOSIS — E038 Other specified hypothyroidism: Secondary | ICD-10-CM

## 2014-05-12 NOTE — Patient Instructions (Addendum)
You had the flu shot today  .Please continue all other medications as before, and refills have been done if requested.  Please have the pharmacy call with any other refills you may need.  Please continue your efforts at being more active, low cholesterol diet, and weight control.  Please keep your appointments with your specialists as you may have planned  Please go to the LAB in the Basement (turn left off the elevator) for the tests to be done today  You will be contacted by phone if any changes need to be made immediately.  Otherwise, you will receive a letter about your results with an explanation, but please check with MyChart first.  Please remember to sign up for MyChart if you have not done so, as this will be important to you in the future with finding out test results, communicating by private email, and scheduling acute appointments online when needed.  Please return in 6 months, or sooner if needed 

## 2014-05-12 NOTE — Progress Notes (Signed)
Pre visit review using our clinic review tool, if applicable. No additional management support is needed unless otherwise documented below in the visit note. 

## 2014-05-12 NOTE — Assessment & Plan Note (Signed)
stable overall by history and exam, recent data reviewed with pt, and pt to continue medical treatment as before,  to f/u any worsening symptoms or concerns Lab Results  Component Value Date   TSH 3.90 10/16/2012

## 2014-05-12 NOTE — Assessment & Plan Note (Signed)
stable overall by history and exam, recent data reviewed with pt, and pt to continue medical treatment as before,  to f/u any worsening symptoms or concerns Lab Results  Component Value Date   CHOL 151 10/16/2012   HDL 22.70* 10/16/2012   LDLDIRECT 68.3 10/16/2012   TRIG 382.0* 10/16/2012   CHOLHDL 7 10/16/2012

## 2014-05-12 NOTE — Assessment & Plan Note (Addendum)
stable overall by history and exam, recent data reviewed with pt, and pt to continue medical treatment as before,  to f/u any worsening symptoms or concerns  Lab Results  Component Value Date   HGBA1C 6.7* 11/20/2011   For f/u lab

## 2014-05-12 NOTE — Progress Notes (Signed)
Subjective:    Patient ID: Danielle Newman, female    DOB: 11/28/1931, 80 y.o.   MRN: [id number]  HPI  Here to f/u; overall doing ok,  Pt denies chest pain, increased sob or doe, wheezing, orthopnea, PND, increased LE swelling, palpitations, dizziness or syncope.  Pt denies polydipsia, polyuria, or low sugar symptoms such as weakness or confusion improved with po intake.  Pt denies new neurological symptoms such as new headache, or facial or extremity weakness or numbness.   Pt states overall good compliance with meds, has been trying to follow lower cholesterol, diabetic diet, with wt overall stable,  but little exercise however.  Denies hyper or hypo thyroid symptoms such as voice, skin or hair change.. Denies worsening depressive symptoms, suicidal ideation, or panic. Past Medical History  Diagnosis Date  . ALLERGIC RHINITIS 02/11/2008  . ANXIETY 02/11/2008  . CORONARY ARTERY DISEASE 02/11/2008  . DEPRESSION 02/11/2008  . DYSPNEA 09/11/2010  . GERD 02/11/2008  . HYPERLIPIDEMIA 02/11/2008  . HYPOTHYROIDISM 02/11/2008  . LUNG CANCER, HX OF 02/11/2008  . MACULAR DEGENERATION 03/29/2009  . OSTEOPENIA 02/11/2008  . Sarcoidosis 02/11/2008  . Sight impaired 04/05/2011  . 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 Impaired glucose tolerance 11/20/2011  . DM (diabetes mellitus) 11/21/2011   Past Surgical History  Procedure Laterality Date  . S/p left upper lobectomy    . S/p arm surgury      reports that she has never smoked. She does not have any smokeless tobacco history on file. She reports that she does not drink alcohol or use illicit drugs. family history includes Allergies in her daughter; Cancer in her daughter and mother. Allergies  Allergen Reactions  . Meperidine Hcl    Current Outpatient Prescriptions on File Prior to Visit  Medication Sig Dispense Refill  . aspirin 81 MG tablet Take 81 mg by mouth daily.        . butalbital-acetaminophen-caffeine (FIORICET) 50-325-40 MG per  tablet 1 tab by mouth per day as needed for migraine  20 tablet  0  . citalopram (CELEXA) 20 MG tablet TAKE ONE TABLET BY MOUTH ONCE A DAY  90 tablet  2  . diazepam (VALIUM) 10 MG tablet Take 1 tablet (10 mg total) by mouth 2 (two) times daily as needed.  60 tablet  1  . levothyroxine (SYNTHROID, LEVOTHROID) 100 MCG tablet Take 1 tablet (100 mcg total) by mouth daily.  90 tablet  3  . lovastatin (MEVACOR) 40 MG tablet TAKE ONE TABLET BY MOUTH DAILY  90 tablet  3  . omeprazole (PRILOSEC) 20 MG capsule Take 1 capsule (20 mg total) by mouth daily.  90 capsule  3   No current facility-administered medications on file prior to visit.   Review of Systems  Constitutional: Negative for unusual diaphoresis or other sweats  HENT: Negative for ringing in ear Eyes: Negative for double vision or worsening visual disturbance.  Respiratory: Negative for choking and stridor.   Gastrointestinal: Negative for vomiting or other signifcant bowel change Genitourinary: Negative for hematuria or decreased urine volume.  Musculoskeletal: Negative for other MSK pain or swelling Skin: Negative for color change and worsening wound.  Neurological: Negative for tremors and numbness other than noted  Psychiatric/Behavioral: Negative for decreased concentration or agitation other than above       Objective:   Physical Exam BP 108/70  Pulse 88  Temp(Src) 98.3 F (36.8 C) (Oral)  Ht 5\' 7"  (1.702 m)  Wt  226 lb 2 oz (102.57 kg)  BMI 35.41 kg/m2  SpO2 91% VS noted,  Constitutional: Pt appears well-developed, well-nourished.  HENT: Head: NCAT.  Right Ear: External ear normal.  Left Ear: External ear normal.  Eyes: . Pupils are equal, round, and reactive to light. Conjunctivae and EOM are normal Neck: Normal range of motion. Neck supple.  Cardiovascular: Normal rate and regular rhythm.   Pulmonary/Chest: Effort normal and breath sounds normal.  Abd:  Soft, NT, ND, + BS Neurological: Pt is alert. Not confused ,  motor grossly intact Skin: Skin is warm. No rash Psychiatric: Pt behavior is normal. No agitation.     Assessment & Plan:

## 2014-05-12 NOTE — Assessment & Plan Note (Signed)
stable overall by history and exam, recent data reviewed with pt, and pt to continue medical treatment as before,  to f/u any worsening symptoms or concerns Lab Results  Component Value Date   WBC 8.9 10/16/2012   HGB 13.4 10/16/2012   HCT 39.7 10/16/2012   PLT 319.0 10/16/2012   GLUCOSE 94 10/16/2012   CHOL 151 10/16/2012   TRIG 382.0* 10/16/2012   HDL 22.70* 10/16/2012   LDLDIRECT 68.3 10/16/2012   ALT 18 10/16/2012   AST 21 10/16/2012   NA 137 10/16/2012   K 4.8 10/16/2012   CL 98 10/16/2012   CREATININE 1.1 10/16/2012   BUN 18 10/16/2012   CO2 31 10/16/2012   TSH 3.90 10/16/2012   HGBA1C 6.7* 11/20/2011

## 2014-06-29 ENCOUNTER — Other Ambulatory Visit: Payer: Self-pay

## 2014-06-29 MED ORDER — LEVOTHYROXINE SODIUM 100 MCG PO TABS: 100.0000 ug | ORAL_TABLET | Freq: Every day | ORAL | Status: DC

## 2014-10-04 ENCOUNTER — Telehealth: Payer: Self-pay | Admitting: Internal Medicine

## 2014-10-04 NOTE — Telephone Encounter (Signed)
Danielle Newman is asking for something other than butalbital-acetaminophen-caffeine (FIORICET) 50-325-40 MG per tablet [[id number]][[id number]] to be called in for patient's migraines. She states that it completley disorients patient. She is requesting imatrex. Please advise

## 2014-10-05 NOTE — Telephone Encounter (Signed)
Unfortunately we should not prescribe the imitrex due to the hx of CAD

## 2014-10-06 NOTE — Telephone Encounter (Signed)
Notified Adrienne with md response. She stated that she ended up giving pt one of her imitrex and headache went away, but will not give her another one. Pt has appt with md next month will discuss sxs then....Raechel Chute/lmb

## 2014-11-11 ENCOUNTER — Other Ambulatory Visit (INDEPENDENT_AMBULATORY_CARE_PROVIDER_SITE_OTHER): Payer: Medicare Other

## 2014-11-11 ENCOUNTER — Telehealth: Payer: Self-pay

## 2014-11-11 ENCOUNTER — Ambulatory Visit (INDEPENDENT_AMBULATORY_CARE_PROVIDER_SITE_OTHER): Payer: Medicare Other | Admitting: Internal Medicine

## 2014-11-11 ENCOUNTER — Encounter: Payer: Self-pay | Admitting: Internal Medicine

## 2014-11-11 VITALS — BP 120/70 | HR 79 | Temp 98.4°F | Resp 18 | Ht 67.0 in | Wt 223.0 lb

## 2014-11-11 DIAGNOSIS — E785 Hyperlipidemia, unspecified: Secondary | ICD-10-CM | POA: Diagnosis not present

## 2014-11-11 DIAGNOSIS — Z0000 Encounter for general adult medical examination without abnormal findings: Secondary | ICD-10-CM

## 2014-11-11 DIAGNOSIS — J209 Acute bronchitis, unspecified: Secondary | ICD-10-CM

## 2014-11-11 DIAGNOSIS — R7989 Other specified abnormal findings of blood chemistry: Secondary | ICD-10-CM

## 2014-11-11 DIAGNOSIS — R519 Headache, unspecified: Secondary | ICD-10-CM

## 2014-11-11 DIAGNOSIS — R51 Headache: Principal | ICD-10-CM

## 2014-11-11 DIAGNOSIS — E119 Type 2 diabetes mellitus without complications: Secondary | ICD-10-CM | POA: Diagnosis not present

## 2014-11-11 DIAGNOSIS — E039 Hypothyroidism, unspecified: Secondary | ICD-10-CM

## 2014-11-11 LAB — TSH: TSH: 3.4 u[IU]/mL (ref 0.35–4.50)

## 2014-11-11 LAB — HEMOGLOBIN A1C: HEMOGLOBIN A1C: 6 % (ref 4.6–6.5)

## 2014-11-11 LAB — CBC WITH DIFFERENTIAL/PLATELET
BASOS PCT: 0.5 % (ref 0.0–3.0)
Basophils Absolute: 0 10*3/uL (ref 0.0–0.1)
EOS ABS: 0.3 10*3/uL (ref 0.0–0.7)
EOS PCT: 3.1 % (ref 0.0–5.0)
HCT: 41.3 % (ref 36.0–46.0)
HEMOGLOBIN: 14.2 g/dL (ref 12.0–15.0)
LYMPHS PCT: 20.1 % (ref 12.0–46.0)
Lymphs Abs: 1.7 10*3/uL (ref 0.7–4.0)
MCHC: 34.5 g/dL (ref 30.0–36.0)
MCV: 86.9 fl (ref 78.0–100.0)
Monocytes Absolute: 0.6 10*3/uL (ref 0.1–1.0)
Monocytes Relative: 7.5 % (ref 3.0–12.0)
NEUTROS PCT: 68.8 % (ref 43.0–77.0)
Neutro Abs: 5.8 10*3/uL (ref 1.4–7.7)
Platelets: 303 10*3/uL (ref 150.0–400.0)
RBC: 4.75 Mil/uL (ref 3.87–5.11)
RDW: 15 % (ref 11.5–15.5)
WBC: 8.4 10*3/uL (ref 4.0–10.5)

## 2014-11-11 LAB — BASIC METABOLIC PANEL
BUN: 18 mg/dL (ref 6–23)
CALCIUM: 9.4 mg/dL (ref 8.4–10.5)
CO2: 29 meq/L (ref 19–32)
Chloride: 100 mEq/L (ref 96–112)
Creatinine, Ser: 1.06 mg/dL (ref 0.40–1.20)
GFR: 52.91 mL/min — ABNORMAL LOW (ref >60.00)
GLUCOSE: 102 mg/dL — AB (ref 70–99)
Potassium: 4.6 mEq/L (ref 3.5–5.1)
Sodium: 136 mEq/L (ref 135–145)

## 2014-11-11 LAB — MICROALBUMIN / CREATININE URINE RATIO
Creatinine,U: 149.8 mg/dL
MICROALB/CREAT RATIO: 0.7 mg/g (ref 0.0–30.0)
Microalb, Ur: 1.1 mg/dL (ref 0.0–1.9)

## 2014-11-11 LAB — HEPATIC FUNCTION PANEL
ALK PHOS: 84 U/L (ref 39–117)
ALT: 14 U/L (ref 0–35)
AST: 17 U/L (ref 0–37)
Albumin: 3.9 g/dL (ref 3.5–5.2)
BILIRUBIN DIRECT: 0 mg/dL (ref 0.0–0.3)
BILIRUBIN TOTAL: 0.4 mg/dL (ref 0.2–1.2)
Total Protein: 7.6 g/dL (ref 6.0–8.3)

## 2014-11-11 LAB — LIPID PANEL
CHOL/HDL RATIO: 5
CHOLESTEROL: 159 mg/dL (ref 0–200)
HDL: 30.1 mg/dL — ABNORMAL LOW (ref >39.00)
NONHDL: 128.9
TRIGLYCERIDES: 330 mg/dL — AB (ref 0.0–149.0)
VLDL: 66 mg/dL — ABNORMAL HIGH (ref 0.0–40.0)

## 2014-11-11 LAB — LDL CHOLESTEROL, DIRECT: Direct LDL: 77 mg/dL

## 2014-11-11 MED ORDER — HYDROCODONE-HOMATROPINE 5-1.5 MG/5ML PO SYRP: 5.0000 mL | ORAL_SOLUTION | Freq: Four times a day (QID) | ORAL | Status: DC | PRN

## 2014-11-11 MED ORDER — AZITHROMYCIN 250 MG PO TABS: ORAL_TABLET | ORAL | Status: DC

## 2014-11-11 NOTE — Assessment & Plan Note (Signed)
.  stable overall by history and exam, recent data reviewed with pt, and pt to continue medical treatment as before,  to f/u any worsening symptoms or concerns Lab Results  Component Value Date   HGBA1C 6.7* 11/20/2011

## 2014-11-11 NOTE — Patient Instructions (Signed)
Please take all new medication as prescribed - the antibiotic, and cough medicine  Please continue all other medications as before, and refills have been done if requested.  Please have the pharmacy call with any other refills you may need.  Please continue your efforts at being more active, low cholesterol diet, and weight control.  You are otherwise up to date with prevention measures today.  Please keep your appointments with your specialists as you may have planned  Please go to the LAB in the Basement (turn left off the elevator) for the tests to be done today  You will be contacted by phone if any changes need to be made immediately.  Otherwise, you will receive a letter about your results with an explanation, but please check with MyChart first.  Please return in 6 months, or sooner if needed

## 2014-11-11 NOTE — Progress Notes (Signed)
Subjective:    Patient ID: Danielle Newman, female    DOB: 12-10-1931, 81 y.o.   MRN: [id number]  HPI  Here for wellness and f/u;  Overall doing ok;  Pt denies Chest pain, worsening SOB, DOE, wheezing, orthopnea, PND, worsening LE edema, palpitations, dizziness or syncope, except for Here with acute onset mild to mod 2-3 wks ST, HA, general weakness and malaise, with prod cough greenish sputum. .  Pt denies neurological change such as new headache, facial or extremity weakness.  Pt denies polydipsia, polyuria, or low sugar symptoms. Pt states overall good compliance with treatment and medications, good tolerability, and has been trying to follow appropriate diet.  Pt denies worsening depressive symptoms, suicidal ideation or panic. No fever, night sweats, wt loss, loss of appetite, or other constitutional symptoms.  Pt states good ability with ADL's, has low fall risk, home safety reviewed and adequate, no other significant changes in hearing or vision, and only occasionally active with exercise. No other current complaints Past Medical History  Diagnosis Date  . ALLERGIC RHINITIS 02/11/2008  . ANXIETY 02/11/2008  . CORONARY ARTERY DISEASE 02/11/2008  . DEPRESSION 02/11/2008  . DYSPNEA 09/11/2010  . GERD 02/11/2008  . HYPERLIPIDEMIA 02/11/2008  . HYPOTHYROIDISM 02/11/2008  . LUNG CANCER, HX OF 02/11/2008  . MACULAR DEGENERATION 03/29/2009  . OSTEOPENIA 02/11/2008  . Sarcoidosis 02/11/2008  . Sight impaired 04/05/2011  . 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 Impaired glucose tolerance 11/20/2011  . DM (diabetes mellitus) 11/21/2011   Past Surgical History  Procedure Laterality Date  . S/p left upper lobectomy    . S/p arm surgury      reports that she has never smoked. She does not have any smokeless tobacco history on file. She reports that she does not drink alcohol or use illicit drugs. family history includes Allergies in her daughter; Cancer in her daughter and mother. Allergies    Allergen Reactions  . Fioricet [Butalbital-Apap-Caffeine] Other (See Comments)    confusion  . Meperidine Hcl    Current Outpatient Prescriptions on File Prior to Visit  Medication Sig Dispense Refill  . aspirin 81 MG tablet Take 81 mg by mouth daily.      . citalopram (CELEXA) 20 MG tablet TAKE ONE TABLET BY MOUTH ONCE A DAY 90 tablet 2  . diazepam (VALIUM) 10 MG tablet Take 1 tablet (10 mg total) by mouth 2 (two) times daily as needed. 60 tablet 1  . levothyroxine (SYNTHROID, LEVOTHROID) 100 MCG tablet Take 1 tablet (100 mcg total) by mouth daily. 90 tablet 3  . lovastatin (MEVACOR) 40 MG tablet TAKE ONE TABLET BY MOUTH DAILY 90 tablet 3  . omeprazole (PRILOSEC) 20 MG capsule Take 1 capsule (20 mg total) by mouth daily. 90 capsule 3  . butalbital-acetaminophen-caffeine (FIORICET) 50-325-40 MG per tablet 1 tab by mouth per day as needed for migraine (Patient not taking: Reported on 11/11/2014) 20 tablet 0   No current facility-administered medications on file prior to visit.   Review of Systems Constitutional: Negative for increased diaphoresis, other activity, appetite or siginficant weight change other than noted HENT: Negative for worsening hearing loss, ear pain, facial swelling, mouth sores and neck stiffness.   Eyes: Negative for other worsening pain, redness or visual disturbance.  Respiratory: Negative for shortness of breath and wheezing  Cardiovascular: Negative for chest pain and palpitations.  Gastrointestinal: Negative for diarrhea, blood in stool, abdominal distention or other pain Genitourinary: Negative for hematuria, flank  pain or change in urine volume.  Musculoskeletal: Negative for myalgias or other joint complaints.  Skin: Negative for color change and wound or drainage.  Neurological: Negative for syncope and numbness. other than noted Hematological: Negative for adenopathy. or other swelling Psychiatric/Behavioral: Negative for hallucinations, SI, self-injury,  decreased concentration or other worsening agitation.      Objective:   Physical Exam BP 120/70 mmHg  Pulse 79  Temp(Src) 98.4 F (36.9 C) (Oral)  Resp 18  Ht  (1.702 m)  Wt 223 lb (101.152 kg)  BMI 34.92 kg/m2  SpO2 96% VS noted, mild  ill Constitutional: Pt is oriented to person, place, and time. Appears well-developed and well-nourished, in no significant distress Head: Normocephalic and atraumatic.  Right Ear: External ear normal.  Left Ear: External ear normal.  Nose: Nose normal.  Mouth/Throat: Oropharynx is clear and moist.  Bilat tm's with mild erythema.  Max sinus areas non tender.  Pharynx with mild erythema, no exudate Eyes: Conjunctivae and EOM are normal. Pupils are equal, round, and reactive to light.  Neck: Normal range of motion. Neck supple. No JVD present. No tracheal deviation present or significant neck LA or mass Cardiovascular: Normal rate, regular rhythm, normal heart sounds and intact distal pulses.   Pulmonary/Chest: Effort normal and breath sounds without rales or wheezing  Abdominal: Soft. Bowel sounds are normal. NT. No HSM  Musculoskeletal: Normal range of motion. Exhibits no edema.  Lymphadenopathy:  Has no cervical adenopathy.  Neurological: Pt is alert and oriented to person, place, and time. Pt has normal reflexes. No cranial nerve deficit. Motor grossly intact Skin: Skin is warm and dry. No rash noted.  Psychiatric:  Has normal mood and affect. Behavior is normal.     Assessment & Plan:

## 2014-11-11 NOTE — Assessment & Plan Note (Signed)
Cant r/o pna, delcines cxr, Mild to mod, for antibx course,  to f/u any worsening symptoms or concerns, prn cough med

## 2014-11-11 NOTE — Telephone Encounter (Signed)
Patient really wants to be off of the Fioricet since it makes her dizzy. Spoke with her daughter and she wants her off as well. She also disagreed with having any artery problems. Since she can't take the Imitrex, she would like to take something else in place of the Fioricet.

## 2014-11-11 NOTE — Assessment & Plan Note (Signed)

## 2014-11-11 NOTE — Progress Notes (Signed)
Pre visit review using our clinic review tool, if applicable. No additional management support is needed unless otherwise documented below in the visit note. 

## 2014-11-12 NOTE — Telephone Encounter (Signed)
Unfortunately, I dont have anything else to offer at this time for acute treatment  I can refer to neurology if she wants

## 2014-11-12 NOTE — Telephone Encounter (Signed)
Pt would like referral to neurology

## 2014-11-13 NOTE — Addendum Note (Signed)
Addended by: Corwin LevinsJOHN, Karlisha Mathena W on: 11/13/2014 06:42 PM   Modules accepted: Orders

## 2014-11-22 ENCOUNTER — Other Ambulatory Visit: Payer: Self-pay | Admitting: Internal Medicine

## 2014-12-25 ENCOUNTER — Other Ambulatory Visit: Payer: Self-pay | Admitting: Internal Medicine

## 2015-05-17 ENCOUNTER — Ambulatory Visit: Payer: Medicare Other | Admitting: Internal Medicine

## 2015-05-20 ENCOUNTER — Ambulatory Visit: Payer: Medicare Other | Admitting: Internal Medicine

## 2015-05-20 DIAGNOSIS — Z0289 Encounter for other administrative examinations: Secondary | ICD-10-CM

## 2015-05-31 ENCOUNTER — Encounter: Payer: Self-pay | Admitting: Internal Medicine

## 2015-05-31 ENCOUNTER — Telehealth: Payer: Self-pay | Admitting: Internal Medicine

## 2015-05-31 ENCOUNTER — Ambulatory Visit (INDEPENDENT_AMBULATORY_CARE_PROVIDER_SITE_OTHER): Payer: Medicare Other | Admitting: Internal Medicine

## 2015-05-31 VITALS — BP 140/84 | HR 80 | Temp 97.5°F | Ht 67.0 in | Wt 228.0 lb

## 2015-05-31 DIAGNOSIS — E038 Other specified hypothyroidism: Secondary | ICD-10-CM | POA: Diagnosis not present

## 2015-05-31 DIAGNOSIS — E119 Type 2 diabetes mellitus without complications: Secondary | ICD-10-CM | POA: Diagnosis not present

## 2015-05-31 DIAGNOSIS — E785 Hyperlipidemia, unspecified: Secondary | ICD-10-CM | POA: Diagnosis not present

## 2015-05-31 DIAGNOSIS — Z23 Encounter for immunization: Secondary | ICD-10-CM

## 2015-05-31 NOTE — Assessment & Plan Note (Signed)
stable overall by history and exam, recent data reviewed with pt, and pt to continue medical treatment as before,  to f/u any worsening symptoms or concerns Lab Results  Component Value Date   CHOL 159 11/11/2014   HDL 30.10* 11/11/2014   LDLDIRECT 77.0 11/11/2014   TRIG 330.0* 11/11/2014   CHOLHDL 5 11/11/2014

## 2015-05-31 NOTE — Telephone Encounter (Signed)
Is requesting a parking placard form to be completed for her.

## 2015-05-31 NOTE — Progress Notes (Signed)
Pre visit review using our clinic review tool, if applicable. No additional management support is needed unless otherwise documented below in the visit note. 

## 2015-05-31 NOTE — Assessment & Plan Note (Signed)
stable overall by history and exam, recent data reviewed with pt, and pt to continue medical treatment as before,  to f/u any worsening symptoms or concerns Lab Results  Component Value Date   HGBA1C 6.0 11/11/2014

## 2015-05-31 NOTE — Progress Notes (Signed)
Subjective:    Patient ID: Danielle Newman, female    DOB: 26-Feb-1932, 81 y.o.   MRN: [id number]  HPI  Here to f/u; overall doing ok,  Pt denies chest pain, increasing sob or doe, wheezing, orthopnea, PND, increased LE swelling, palpitations, dizziness or syncope.  Pt denies new neurological symptoms such as new headache, or facial or extremity weakness or numbness.  Pt denies polydipsia, polyuria, or low sugar episode.   Pt denies new neurological symptoms such as new headache, or facial or extremity weakness or numbness.   Pt states overall good compliance with meds, mostly trying to follow appropriate diet, with wt overall stable,  but little exercise however.  Pt continues to have recurring LBP without change in severity, bowel or bladder change, fever, wt loss,  worsening LE pain/numbness/weakness, gait change or falls, tylenol helps. No other compalints  Denies hyper or hypo thyroid symptoms such as voice, skin or hair change.  Past Medical History  Diagnosis Date  . ALLERGIC RHINITIS 02/11/2008  . ANXIETY 02/11/2008  . CORONARY ARTERY DISEASE 02/11/2008  . DEPRESSION 02/11/2008  . DYSPNEA 09/11/2010  . GERD 02/11/2008  . HYPERLIPIDEMIA 02/11/2008  . HYPOTHYROIDISM 02/11/2008  . LUNG CANCER, HX OF 02/11/2008  . MACULAR DEGENERATION 03/29/2009  . OSTEOPENIA 02/11/2008  . Sarcoidosis (HCC) 02/11/2008  . Sight impaired 04/05/2011  . 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 Impaired glucose tolerance 11/20/2011  . DM (diabetes mellitus) (HCC) 11/21/2011   Past Surgical History  Procedure Laterality Date  . S/p left upper lobectomy    . S/p arm surgury      reports that she has never smoked. She does not have any smokeless tobacco history on file. She reports that she does not drink alcohol or use illicit drugs. family history includes Allergies in her daughter; Cancer in her daughter and mother. Allergies  Allergen Reactions  . Fioricet [Butalbital-Apap-Caffeine] Other (See  Comments)    confusion  . Meperidine Hcl    Current Outpatient Prescriptions on File Prior to Visit  Medication Sig Dispense Refill  . aspirin 81 MG tablet Take 81 mg by mouth daily.      . citalopram (CELEXA) 20 MG tablet TAKE ONE TABLET BY MOUTH ONCE A DAY 90 tablet 2  . citalopram (CELEXA) 20 MG tablet TAKE ONE TABLET BY MOUTH ONCE A DAY 90 tablet 3  . diazepam (VALIUM) 10 MG tablet Take 1 tablet (10 mg total) by mouth 2 (two) times daily as needed. 60 tablet 1  . levothyroxine (SYNTHROID, LEVOTHROID) 100 MCG tablet Take 1 tablet (100 mcg total) by mouth daily. 90 tablet 3  . lovastatin (MEVACOR) 40 MG tablet TAKE ONE TABLET BY MOUTH DAILY 90 tablet 3  . omeprazole (PRILOSEC) 20 MG capsule Take 1 capsule (20 mg total) by mouth daily. 90 capsule 3  . azithromycin (ZITHROMAX Z-PAK) 250 MG tablet Use as directed (Patient not taking: Reported on 05/31/2015) 6 tablet 1  . butalbital-acetaminophen-caffeine (FIORICET) 50-325-40 MG per tablet 1 tab by mouth per day as needed for migraine (Patient not taking: Reported on 11/11/2014) 20 tablet 0  . HYDROcodone-homatropine (HYCODAN) 5-1.5 MG/5ML syrup Take 5 mLs by mouth every 6 (six) hours as needed for cough. (Patient not taking: Reported on 05/31/2015) 180 mL 0   No current facility-administered medications on file prior to visit.    Review of Systems  Constitutional: Negative for unusual diaphoresis or night sweats HENT: Negative for ringing in ear or  discharge Eyes: Negative for double vision or worsening visual disturbance.  Respiratory: Negative for choking and stridor.   Gastrointestinal: Negative for vomiting or other signifcant bowel change Genitourinary: Negative for hematuria or change in urine volume.  Musculoskeletal: Negative for other MSK pain or swelling Skin: Negative for color change and worsening wound.  Neurological: Negative for tremors and numbness other than noted  Psychiatric/Behavioral: Negative for decreased  concentration or agitation other than above       Objective:   Physical Exam BP 140/84 mmHg  Pulse 80  Temp(Src) 97.5 F (36.4 C) (Oral)  Ht 5\' 7"  (1.702 m)  Wt 228 lb (103.42 kg)  BMI 35.70 kg/m2  SpO2 96%  VS noted,  Constitutional: Pt appears in no significant distress HENT: Head: NCAT.  Right Ear: External ear normal.  Left Ear: External ear normal.  Eyes: . Pupils are equal, round, and reactive to light. Conjunctivae and EOM are normal Neck: Normal range of motion. Neck supple.  Cardiovascular: Normal rate and regular rhythm.   Pulmonary/Chest: Effort normal and breath sounds without rales or wheezing.  Abd:  Soft, NT, ND, + BS Neurological: Pt is alert. Not confused , motor grossly intact rexcept low vision Skin: Skin is warm. No rash, no LE edema Psychiatric: Pt behavior is normal. No agitation.     Assessment & Plan:

## 2015-05-31 NOTE — Assessment & Plan Note (Signed)
stable overall by history and exam, recent data reviewed with pt, and pt to continue medical treatment as before,  to f/u any worsening symptoms or concerns Lab Results  Component Value Date   TSH 3.40 11/11/2014

## 2015-05-31 NOTE — Patient Instructions (Addendum)
You had the flu shot today  Please continue all other medications as before, and refills have been done if requested.  Please have the pharmacy call with any other refills you may need.  Please continue your efforts at being more active, low cholesterol diet, and weight control.  You are otherwise up to date with prevention measures today.  Please keep your appointments with your specialists as you may have planned  Please return in 6 months, or sooner if needed 

## 2015-06-01 NOTE — Telephone Encounter (Signed)
Placard form filled out and placed in cabinet for pt pick up. Pt advised of same via home VM

## 2015-06-02 ENCOUNTER — Other Ambulatory Visit: Payer: Self-pay

## 2015-06-02 MED ORDER — LEVOTHYROXINE SODIUM 100 MCG PO TABS: 100.0000 ug | ORAL_TABLET | Freq: Every day | ORAL | Status: DC

## 2015-11-14 ENCOUNTER — Telehealth: Payer: Self-pay

## 2015-11-14 NOTE — Telephone Encounter (Signed)
Please advise patient is requesting refill on celexa

## 2015-11-15 MED ORDER — CITALOPRAM HYDROBROMIDE 20 MG PO TABS: 20.0000 mg | ORAL_TABLET | Freq: Every day | ORAL | Status: DC

## 2015-11-15 NOTE — Addendum Note (Signed)
Addended by: Corwin LevinsJOHN, Mattison Stuckey W on: 11/15/2015 05:52 AM   Modules accepted: Orders

## 2015-11-15 NOTE — Telephone Encounter (Signed)
Done erx 

## 2015-11-29 ENCOUNTER — Ambulatory Visit: Payer: Medicare Other | Admitting: Internal Medicine

## 2015-12-06 ENCOUNTER — Ambulatory Visit: Payer: Medicare Other | Admitting: Internal Medicine

## 2015-12-26 ENCOUNTER — Other Ambulatory Visit: Payer: Self-pay | Admitting: Internal Medicine

## 2016-03-16 ENCOUNTER — Telehealth: Payer: Self-pay

## 2016-03-16 NOTE — Telephone Encounter (Signed)
Patient is on the list for [facility] 2017 and may be a good candidate for an AWV in 2017. Please let me know if/when appt is scheduled.   

## 2016-04-03 NOTE — Telephone Encounter (Signed)
Call to Danielle Newman and LVM to call the office and schedule her AWV;

## 2016-04-24 ENCOUNTER — Encounter: Payer: Medicare Other | Admitting: Internal Medicine

## 2016-05-01 ENCOUNTER — Other Ambulatory Visit (INDEPENDENT_AMBULATORY_CARE_PROVIDER_SITE_OTHER): Payer: Medicare Other

## 2016-05-01 ENCOUNTER — Ambulatory Visit (INDEPENDENT_AMBULATORY_CARE_PROVIDER_SITE_OTHER): Payer: Medicare Other | Admitting: Internal Medicine

## 2016-05-01 VITALS — BP 130/70 | HR 92 | Temp 98.4°F | Resp 20 | Wt 234.0 lb

## 2016-05-01 DIAGNOSIS — R7989 Other specified abnormal findings of blood chemistry: Secondary | ICD-10-CM | POA: Diagnosis not present

## 2016-05-01 DIAGNOSIS — E119 Type 2 diabetes mellitus without complications: Secondary | ICD-10-CM

## 2016-05-01 DIAGNOSIS — E785 Hyperlipidemia, unspecified: Secondary | ICD-10-CM | POA: Diagnosis not present

## 2016-05-01 DIAGNOSIS — Z0001 Encounter for general adult medical examination with abnormal findings: Secondary | ICD-10-CM

## 2016-05-01 DIAGNOSIS — E038 Other specified hypothyroidism: Secondary | ICD-10-CM | POA: Diagnosis not present

## 2016-05-01 LAB — HEPATIC FUNCTION PANEL
ALT: 16 U/L (ref 0–35)
AST: 17 U/L (ref 0–37)
Albumin: 3.7 g/dL (ref 3.5–5.2)
Alkaline Phosphatase: 65 U/L (ref 39–117)
BILIRUBIN DIRECT: 0 mg/dL (ref 0.0–0.3)
TOTAL PROTEIN: 7.5 g/dL (ref 6.0–8.3)
Total Bilirubin: 0.4 mg/dL (ref 0.2–1.2)

## 2016-05-01 LAB — LIPID PANEL
Cholesterol: 159 mg/dL (ref 0–200)
HDL: 29.7 mg/dL — AB (ref >39.00)
NONHDL: 129.73
TRIGLYCERIDES: 338 mg/dL — AB (ref 0.0–149.0)
Total CHOL/HDL Ratio: 5
VLDL: 67.6 mg/dL — AB (ref 0.0–40.0)

## 2016-05-01 LAB — URINALYSIS, ROUTINE W REFLEX MICROSCOPIC
Bilirubin Urine: NEGATIVE
Hgb urine dipstick: NEGATIVE
KETONES UR: NEGATIVE
LEUKOCYTES UA: NEGATIVE
Nitrite: NEGATIVE
PH: 7 (ref 5.0–8.0)
SPECIFIC GRAVITY, URINE: 1.02 (ref 1.000–1.030)
TOTAL PROTEIN, URINE-UPE24: NEGATIVE
URINE GLUCOSE: NEGATIVE
UROBILINOGEN UA: 1 (ref 0.0–1.0)

## 2016-05-01 LAB — BASIC METABOLIC PANEL
BUN: 20 mg/dL (ref 6–23)
CALCIUM: 9.2 mg/dL (ref 8.4–10.5)
CO2: 29 meq/L (ref 19–32)
CREATININE: 0.93 mg/dL (ref 0.40–1.20)
Chloride: 103 mEq/L (ref 96–112)
GFR: 61.31 mL/min (ref >60.00)
GLUCOSE: 105 mg/dL — AB (ref 70–99)
Potassium: 4.4 mEq/L (ref 3.5–5.1)
SODIUM: 139 meq/L (ref 135–145)

## 2016-05-01 LAB — LDL CHOLESTEROL, DIRECT: LDL DIRECT: 86 mg/dL

## 2016-05-01 LAB — CBC WITH DIFFERENTIAL/PLATELET
BASOS ABS: 0.1 10*3/uL (ref 0.0–0.1)
Basophils Relative: 0.9 % (ref 0.0–3.0)
EOS ABS: 0.3 10*3/uL (ref 0.0–0.7)
Eosinophils Relative: 3 % (ref 0.0–5.0)
HCT: 40.5 % (ref 36.0–46.0)
Hemoglobin: 14 g/dL (ref 12.0–15.0)
LYMPHS ABS: 1.8 10*3/uL (ref 0.7–4.0)
Lymphocytes Relative: 18.7 % (ref 12.0–46.0)
MCHC: 34.6 g/dL (ref 30.0–36.0)
MCV: 87.2 fl (ref 78.0–100.0)
Monocytes Absolute: 0.7 10*3/uL (ref 0.1–1.0)
Monocytes Relative: 7.2 % (ref 3.0–12.0)
NEUTROS ABS: 6.9 10*3/uL (ref 1.4–7.7)
NEUTROS PCT: 70.2 % (ref 43.0–77.0)
PLATELETS: 307 10*3/uL (ref 150.0–400.0)
RBC: 4.64 Mil/uL (ref 3.87–5.11)
RDW: 14.1 % (ref 11.5–15.5)
WBC: 9.9 10*3/uL (ref 4.0–10.5)

## 2016-05-01 LAB — MICROALBUMIN / CREATININE URINE RATIO
Creatinine,U: 97.3 mg/dL
MICROALB/CREAT RATIO: 1.3 mg/g (ref 0.0–30.0)
Microalb, Ur: 1.3 mg/dL (ref 0.0–1.9)

## 2016-05-01 LAB — HEMOGLOBIN A1C: HEMOGLOBIN A1C: 6 % (ref 4.6–6.5)

## 2016-05-01 LAB — TSH: TSH: 1.99 u[IU]/mL (ref 0.35–4.50)

## 2016-05-01 NOTE — Progress Notes (Signed)
Subjective:    Patient ID: Cecille Averonstance L Outman, female    DOB: 1931-10-25, 82 y.o.   MRN: [id number]  HPI  Here for wellness and f/u;  Overall doing ok;  Pt denies Chest pain, worsening SOB, DOE, wheezing, orthopnea, PND, worsening LE edema, palpitations, dizziness or syncope.  Pt denies neurological change such as new headache, facial or extremity weakness.  Pt denies polydipsia, polyuria, or low sugar symptoms. Pt states overall good compliance with treatment and medications, good tolerability, and has been trying to follow appropriate diet.  Pt denies worsening depressive symptoms, suicidal ideation or panic. No fever, night sweats, wt loss, loss of appetite, or other constitutional symptoms.  Pt states good ability with ADL's, has low fall risk, home safety reviewed and adequate, no other significant changes in hearing or vision, and only occasionally active with exercise.  Has gained fat wt from last year about 10 lbs with dietary indiscretion, admits to sedentary lifestyle. Due for flu shot Wt Readings from Last 3 Encounters:  05/01/16 234 lb (106.1 kg)  05/31/15 228 lb (103.4 kg)  11/11/14 223 lb (101.2 kg)   Past Medical History:  Diagnosis Date  . ALLERGIC RHINITIS 02/11/2008  . ANXIETY 02/11/2008  . 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 CORONARY ARTERY DISEASE 02/11/2008  . DEPRESSION 02/11/2008  . DM (diabetes mellitus) (HCC) 11/21/2011  . DYSPNEA 09/11/2010  . GERD 02/11/2008  . HYPERLIPIDEMIA 02/11/2008  . HYPOTHYROIDISM 02/11/2008  . Impaired glucose tolerance 11/20/2011  . LUNG CANCER, HX OF 02/11/2008  . MACULAR DEGENERATION 03/29/2009  . OSTEOPENIA 02/11/2008  . Sarcoidosis (HCC) 02/11/2008  . Sight impaired 04/05/2011   Past Surgical History:  Procedure Laterality Date  . s/p arm surgury    . s/p left upper lobectomy      reports that she has never smoked. She does not have any smokeless tobacco history on file. She reports that she does not drink alcohol or use  drugs. family history includes Allergies in her daughter; Cancer in her daughter and mother. Allergies  Allergen Reactions  . Fioricet [Butalbital-Apap-Caffeine] Other (See Comments)    confusion  . Meperidine Hcl    Current Outpatient Prescriptions on File Prior to Visit  Medication Sig Dispense Refill  . aspirin 81 MG tablet Take 81 mg by mouth daily.      Marland Kitchen. azithromycin (ZITHROMAX Z-PAK) 250 MG tablet Use as directed 6 tablet 1  . butalbital-acetaminophen-caffeine (FIORICET) 50-325-40 MG per tablet 1 tab by mouth per day as needed for migraine 20 tablet 0  . citalopram (CELEXA) 20 MG tablet Take 1 tablet (20 mg total) by mouth daily. 90 tablet 3  . diazepam (VALIUM) 10 MG tablet Take 1 tablet (10 mg total) by mouth 2 (two) times daily as needed. 60 tablet 1  . HYDROcodone-homatropine (HYCODAN) 5-1.5 MG/5ML syrup Take 5 mLs by mouth every 6 (six) hours as needed for cough. 180 mL 0  . levothyroxine (SYNTHROID, LEVOTHROID) 100 MCG tablet Take 1 tablet (100 mcg total) by mouth daily. 90 tablet 3  . lovastatin (MEVACOR) 40 MG tablet TAKE ONE TABLET BY MOUTH DAILY 90 tablet 2  . omeprazole (PRILOSEC) 20 MG capsule Take 1 capsule (20 mg total) by mouth daily. 90 capsule 3   No current facility-administered medications on file prior to visit.    Review of Systems Constitutional: Negative for increased diaphoresis, or other activity, appetite or siginficant weight change other than noted HENT: Negative for worsening hearing loss, ear  pain, facial swelling, mouth sores and neck stiffness.   Eyes: Negative for other worsening pain, redness or visual disturbance.  Respiratory: Negative for choking or stridor Cardiovascular: Negative for other chest pain and palpitations.  Gastrointestinal: Negative for worsening diarrhea, blood in stool, or abdominal distention Genitourinary: Negative for hematuria, flank pain or change in urine volume.  Musculoskeletal: Negative for myalgias or other joint  complaints.  Skin: Negative for other color change and wound or drainage.  Neurological: Negative for syncope and numbness. other than noted Hematological: Negative for adenopathy. or other swelling Psychiatric/Behavioral: Negative for hallucinations, SI, self-injury, decreased concentration or other worsening agitation.      Objective:   Physical Exam BP 130/70   Pulse 92   Temp 98.4 F (36.9 C) (Oral)   Resp 20   Wt 234 lb (106.1 kg)   SpO2 95%   BMI 36.65 kg/m  VS noted, obese, sight impaired Constitutional: Pt is oriented to person, place, and time. Appears well-developed and well-nourished, in no significant distress Head: Normocephalic and atraumatic  Eyes: Conjunctivae and EOM are normal. Pupils are equal, round, and reactive to light Right Ear: External ear normal.  Left Ear: External ear normal Nose: Nose normal.  Mouth/Throat: Oropharynx is clear and moist  Neck: Normal range of motion. Neck supple. No JVD present. No tracheal deviation present or significant neck LA or mass Cardiovascular: Normal rate, regular rhythm, normal heart sounds and intact distal pulses.   Pulmonary/Chest: Effort normal and breath sounds without rales or wheezing  Abdominal: Soft. Bowel sounds are normal. NT. No HSM  Musculoskeletal: Normal range of motion. Exhibits no edema Lymphadenopathy: Has no cervical adenopathy.  Neurological: Pt is alert and oriented to person, place, and time. Pt has normal reflexes. No cranial nerve deficit. Motor grossly intact Skin: Skin is warm and dry. No rash noted or new ulcers Psychiatric:  Has mild nervous mood and affect. Behavior is normal.         Assessment & Plan:

## 2016-05-01 NOTE — Patient Instructions (Addendum)

## 2016-05-01 NOTE — Progress Notes (Signed)
Pre visit review using our clinic review tool, if applicable. No additional management support is needed unless otherwise documented below in the visit note. 

## 2016-05-06 NOTE — Assessment & Plan Note (Addendum)
stable overall by history and exam, recent data reviewed with pt, and pt to continue medical treatment as before,  to f/u any worsening symptoms or concerns, for f/u lipids Lab Results  Component Value Date   CHOL 159 05/01/2016   HDL 29.70 (L) 05/01/2016   LDLDIRECT 86.0 05/01/2016   TRIG 338.0 (H) 05/01/2016   CHOLHDL 5 05/01/2016

## 2016-05-06 NOTE — Assessment & Plan Note (Signed)

## 2016-05-06 NOTE — Assessment & Plan Note (Signed)
Lab Results  Component Value Date   TSH 1.99 05/01/2016   stable overall by history and exam, recent data reviewed with pt, and pt to continue medical treatment as before,  to f/u any worsening symptoms or concerns

## 2016-05-06 NOTE — Assessment & Plan Note (Signed)
stable overall by history and exam, recent data reviewed with pt, and pt to continue medical treatment as before,  to f/u any worsening symptoms or concerns Lab Results  Component Value Date   HGBA1C 6.0 05/01/2016

## 2016-06-25 ENCOUNTER — Other Ambulatory Visit: Payer: Self-pay | Admitting: *Deleted

## 2016-06-25 MED ORDER — LEVOTHYROXINE SODIUM 100 MCG PO TABS: 100.0000 ug | ORAL_TABLET | Freq: Every day | ORAL | 3 refills | Status: DC

## 2016-09-27 ENCOUNTER — Other Ambulatory Visit: Payer: Self-pay | Admitting: Internal Medicine

## 2016-10-30 ENCOUNTER — Ambulatory Visit: Payer: Medicare Other | Admitting: Internal Medicine

## 2016-11-09 ENCOUNTER — Other Ambulatory Visit (INDEPENDENT_AMBULATORY_CARE_PROVIDER_SITE_OTHER): Payer: Medicare Other

## 2016-11-09 ENCOUNTER — Encounter: Payer: Self-pay | Admitting: Internal Medicine

## 2016-11-09 ENCOUNTER — Ambulatory Visit (INDEPENDENT_AMBULATORY_CARE_PROVIDER_SITE_OTHER): Payer: Medicare Other | Admitting: Internal Medicine

## 2016-11-09 VITALS — BP 120/60 | HR 80 | Temp 98.2°F | Ht 67.0 in | Wt 222.0 lb

## 2016-11-09 DIAGNOSIS — H3530 Unspecified macular degeneration: Secondary | ICD-10-CM

## 2016-11-09 DIAGNOSIS — E119 Type 2 diabetes mellitus without complications: Secondary | ICD-10-CM

## 2016-11-09 DIAGNOSIS — E2839 Other primary ovarian failure: Secondary | ICD-10-CM | POA: Diagnosis not present

## 2016-11-09 DIAGNOSIS — Z0000 Encounter for general adult medical examination without abnormal findings: Secondary | ICD-10-CM | POA: Diagnosis not present

## 2016-11-09 DIAGNOSIS — R7989 Other specified abnormal findings of blood chemistry: Secondary | ICD-10-CM

## 2016-11-09 LAB — CBC WITH DIFFERENTIAL/PLATELET
BASOS PCT: 0.9 % (ref 0.0–3.0)
Basophils Absolute: 0.1 10*3/uL (ref 0.0–0.1)
EOS PCT: 3.6 % (ref 0.0–5.0)
Eosinophils Absolute: 0.4 10*3/uL (ref 0.0–0.7)
HCT: 42.4 % (ref 36.0–46.0)
Hemoglobin: 14.2 g/dL (ref 12.0–15.0)
LYMPHS ABS: 2.4 10*3/uL (ref 0.7–4.0)
Lymphocytes Relative: 21.8 % (ref 12.0–46.0)
MCHC: 33.4 g/dL (ref 30.0–36.0)
MCV: 88 fl (ref 78.0–100.0)
MONOS PCT: 9.1 % (ref 3.0–12.0)
Monocytes Absolute: 1 10*3/uL (ref 0.1–1.0)
NEUTROS ABS: 7.2 10*3/uL (ref 1.4–7.7)
NEUTROS PCT: 64.6 % (ref 43.0–77.0)
PLATELETS: 329 10*3/uL (ref 150.0–400.0)
RBC: 4.82 Mil/uL (ref 3.87–5.11)
RDW: 14.3 % (ref 11.5–15.5)
WBC: 11.2 10*3/uL — ABNORMAL HIGH (ref 4.0–10.5)

## 2016-11-09 LAB — URINALYSIS, ROUTINE W REFLEX MICROSCOPIC
Bilirubin Urine: NEGATIVE
KETONES UR: NEGATIVE
Nitrite: NEGATIVE
SPECIFIC GRAVITY, URINE: 1.01 (ref 1.000–1.030)
Total Protein, Urine: NEGATIVE
Urine Glucose: NEGATIVE
Urobilinogen, UA: 0.2 (ref 0.0–1.0)
pH: 5.5 (ref 5.0–8.0)

## 2016-11-09 LAB — BASIC METABOLIC PANEL
BUN: 20 mg/dL (ref 6–23)
CALCIUM: 9.7 mg/dL (ref 8.4–10.5)
CO2: 33 mEq/L — ABNORMAL HIGH (ref 19–32)
Chloride: 102 mEq/L (ref 96–112)
Creatinine, Ser: 1.02 mg/dL (ref 0.40–1.20)
GFR: 55.04 mL/min — AB (ref >60.00)
Glucose, Bld: 80 mg/dL (ref 70–99)
POTASSIUM: 4.5 meq/L (ref 3.5–5.1)
SODIUM: 139 meq/L (ref 135–145)

## 2016-11-09 LAB — HEPATIC FUNCTION PANEL
ALK PHOS: 58 U/L (ref 39–117)
ALT: 15 U/L (ref 0–35)
AST: 17 U/L (ref 0–37)
Albumin: 4.2 g/dL (ref 3.5–5.2)
BILIRUBIN DIRECT: 0.1 mg/dL (ref 0.0–0.3)
BILIRUBIN TOTAL: 0.5 mg/dL (ref 0.2–1.2)
Total Protein: 7.8 g/dL (ref 6.0–8.3)

## 2016-11-09 LAB — LIPID PANEL
Cholesterol: 164 mg/dL (ref 0–200)
HDL: 31.2 mg/dL — AB (ref >39.00)
NONHDL: 132.59
Total CHOL/HDL Ratio: 5
Triglycerides: 300 mg/dL — ABNORMAL HIGH (ref 0.0–149.0)
VLDL: 60 mg/dL — AB (ref 0.0–40.0)

## 2016-11-09 LAB — LDL CHOLESTEROL, DIRECT: LDL DIRECT: 80 mg/dL

## 2016-11-09 LAB — HEMOGLOBIN A1C: HEMOGLOBIN A1C: 5.9 % (ref 4.6–6.5)

## 2016-11-09 LAB — TSH: TSH: 1.87 u[IU]/mL (ref 0.35–4.50)

## 2016-11-09 NOTE — Assessment & Plan Note (Signed)

## 2016-11-09 NOTE — Assessment & Plan Note (Addendum)
Diet controlled now, stable overall by history and exam, recent data reviewed with pt, and pt to continue medical treatment as before,  to f/u any worsening symptoms or concerns Lab Results  Component Value Date   HGBA1C 6.0 05/01/2016

## 2016-11-09 NOTE — Patient Instructions (Addendum)
Please remember to see your eye doctor yearly.  Please schedule the bone density test before leaving today at the scheduling desk (where you check out)  Please continue all other medications as before, and refills have been done if requested.  Please have the pharmacy call with any other refills you may need.  Please continue your efforts at being more active, low cholesterol diet, and weight control.  You are otherwise up to date with prevention measures today.  Please keep your appointments with your specialists as you may have planned  Please go to the LAB in the Basement (turn left off the elevator) for the tests to be done today  You will be contacted by phone if any changes need to be made immediately.  Otherwise, you will receive a letter about your results with an explanation, but please check with MyChart first.  Please remember to sign up for MyChart if you have not done so, as this will be important to you in the future with finding out test results, communicating by private email, and scheduling acute appointments online when needed.  Please return in 6 months, or sooner if needed

## 2016-11-09 NOTE — Assessment & Plan Note (Signed)
To fu optho as planned 

## 2016-11-09 NOTE — Progress Notes (Signed)
Subjective:    Patient ID: Danielle Newman, female    DOB: 03-21-1932, 83 y.o.   MRN: [id number]  HPI  Here for wellness and f/u;  Overall doing ok;  Pt denies Chest pain, worsening SOB, DOE, wheezing, orthopnea, PND, worsening LE edema, palpitations, dizziness or syncope.  Pt denies neurological change such as new headache, facial or extremity weakness.  Pt denies polydipsia, polyuria, or low sugar symptoms. Pt states overall good compliance with treatment and medications, good tolerability, and has been trying to follow appropriate diet.  Pt denies worsening depressive symptoms, suicidal ideation or panic. No fever, night sweats, wt loss, loss of appetite, or other constitutional symptoms.  Pt states good ability with ADL's, has low fall risk, home safety reviewed and adequate, no other significant changes in hearing and not active with exercise due to vision loss, now 100% on right, and central vision loss on the left.  Due for DXA, declines for now.  .   Past Medical History:  Diagnosis Date  . ALLERGIC RHINITIS 02/11/2008  . ANXIETY 02/11/2008  . 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 CORONARY ARTERY DISEASE 02/11/2008  . DEPRESSION 02/11/2008  . DM (diabetes mellitus) (HCC) 11/21/2011  . DYSPNEA 09/11/2010  . GERD 02/11/2008  . HYPERLIPIDEMIA 02/11/2008  . HYPOTHYROIDISM 02/11/2008  . Impaired glucose tolerance 11/20/2011  . LUNG CANCER, HX OF 02/11/2008  . MACULAR DEGENERATION 03/29/2009  . OSTEOPENIA 02/11/2008  . Sarcoidosis (HCC) 02/11/2008  . Sight impaired 04/05/2011   Past Surgical History:  Procedure Laterality Date  . s/p arm surgury  05/29/1994  . s/p left upper lobectomy      reports that she quit smoking about 22 years ago. Her smoking use included Cigarettes. She smoked 2.00 packs per day. She has never used smokeless tobacco. She reports that she does not drink alcohol or use drugs. family history includes Allergies in her daughter; Cancer in her daughter and  mother. Allergies  Allergen Reactions  . Fioricet [Butalbital-Apap-Caffeine] Other (See Comments)    confusion  . Meperidine Hcl    Current Outpatient Prescriptions on File Prior to Visit  Medication Sig Dispense Refill  . aspirin 81 MG tablet Take 81 mg by mouth daily.      . butalbital-acetaminophen-caffeine (FIORICET) 50-325-40 MG per tablet 1 tab by mouth per day as needed for migraine 20 tablet 0  . citalopram (CELEXA) 20 MG tablet Take 1 tablet (20 mg total) by mouth daily. 90 tablet 3  . diazepam (VALIUM) 10 MG tablet Take 1 tablet (10 mg total) by mouth 2 (two) times daily as needed. 60 tablet 1  . levothyroxine (SYNTHROID, LEVOTHROID) 100 MCG tablet Take 1 tablet (100 mcg total) by mouth daily. 90 tablet 3  . lovastatin (MEVACOR) 40 MG tablet TAKE ONE TABLET BY MOUTH DAILY 90 tablet 2  . omeprazole (PRILOSEC) 20 MG capsule Take 1 capsule (20 mg total) by mouth daily. 90 capsule 3   No current facility-administered medications on file prior to visit.    Review of Systems Constitutional: Negative for other unusual diaphoresis, sweats, appetite or weight changes HENT: Negative for other worsening hearing loss, ear pain, facial swelling, mouth sores or neck stiffness.   Eyes: Negative for other worsening pain, redness or other visual disturbance.  Respiratory: Negative for other stridor or swelling Cardiovascular: Negative for other palpitations or other chest pain  Gastrointestinal: Negative for worsening diarrhea or loose stools, blood in stool, distention or other pain Genitourinary: Negative  for hematuria, flank pain or other change in urine volume.  Musculoskeletal: Negative for myalgias or other joint swelling.  Skin: Negative for other color change, or other wound or worsening drainage.  Neurological: Negative for other syncope or numbness. Hematological: Negative for other adenopathy or swelling Psychiatric/Behavioral: Negative for hallucinations, other worsening  agitation, SI, self-injury, or new decreased concentration All other system neg per pt    Objective:   Physical Exam BP 120/60 (BP Location: Left Arm, Patient Position: Sitting, Cuff Size: Large)   Pulse 80   Temp 98.2 F (36.8 C) (Oral)   Ht  (1.702 m)   Wt 222 lb (100.7 kg)   SpO2 97%   BMI 34.77 kg/m  VS noted,  Constitutional: Pt is oriented to person, place, and time. Appears well-developed and well-nourished, in no significant distress and comfortable Head: Normocephalic and atraumatic  Eyes: Conjunctivae and EOM are normal. Pupils are equal, round, and reactive to light Right Ear: External ear normal without discharge Left Ear: External ear normal without discharge Nose: Nose without discharge or deformity Mouth/Throat: Oropharynx is without other ulcerations and moist  Neck: Normal range of motion. Neck supple. No JVD present. No tracheal deviation present or significant neck LA or mass Cardiovascular: Normal rate, regular rhythm, normal heart sounds and intact distal pulses.   Pulmonary/Chest: WOB normal and breath sounds without rales or wheezing  Abdominal: Soft. Bowel sounds are normal. NT. No HSM  Musculoskeletal: Normal range of motion. Exhibits no edema Lymphadenopathy: Has no other cervical adenopathy.  Neurological: Pt is alert and oriented to person, place, and time. Pt has normal reflexes. No cranial nerve deficit except for vision impaired; Motor grossly intact, Gait intact,  Skin: Skin is warm and dry. No rash noted or new ulcerations Psychiatric:  Has normal mood and affect. Behavior is normal without agitation No other exam findings    Assessment & Plan:

## 2016-11-09 NOTE — Progress Notes (Signed)
Pre visit review using our clinic review tool, if applicable. No additional management support is needed unless otherwise documented below in the visit note. 

## 2016-11-26 DIAGNOSIS — H2013 Chronic iridocyclitis, bilateral: Secondary | ICD-10-CM | POA: Diagnosis not present

## 2016-11-26 DIAGNOSIS — H47011 Ischemic optic neuropathy, right eye: Secondary | ICD-10-CM | POA: Diagnosis not present

## 2016-11-26 DIAGNOSIS — H353134 Nonexudative age-related macular degeneration, bilateral, advanced atrophic with subfoveal involvement: Secondary | ICD-10-CM | POA: Diagnosis not present

## 2016-11-26 DIAGNOSIS — H31009 Unspecified chorioretinal scars, unspecified eye: Secondary | ICD-10-CM | POA: Diagnosis not present

## 2016-11-26 DIAGNOSIS — H353232 Exudative age-related macular degeneration, bilateral, with inactive choroidal neovascularization: Secondary | ICD-10-CM | POA: Diagnosis not present

## 2017-01-24 ENCOUNTER — Other Ambulatory Visit: Payer: Self-pay | Admitting: Internal Medicine

## 2017-05-17 ENCOUNTER — Encounter: Payer: Self-pay | Admitting: Internal Medicine

## 2017-05-17 ENCOUNTER — Ambulatory Visit (INDEPENDENT_AMBULATORY_CARE_PROVIDER_SITE_OTHER)
Admission: RE | Admit: 2017-05-17 | Discharge: 2017-05-17 | Disposition: A | Discharge status: Home / Self Care | Payer: Medicare Other | Source: Ambulatory Visit | Attending: Internal Medicine | Admitting: Internal Medicine

## 2017-05-17 ENCOUNTER — Ambulatory Visit (INDEPENDENT_AMBULATORY_CARE_PROVIDER_SITE_OTHER): Payer: Medicare Other | Admitting: Internal Medicine

## 2017-05-17 VITALS — BP 126/78 | HR 83 | Temp 97.9°F | Ht 67.0 in | Wt 213.0 lb

## 2017-05-17 DIAGNOSIS — E038 Other specified hypothyroidism: Secondary | ICD-10-CM

## 2017-05-17 DIAGNOSIS — H9191 Unspecified hearing loss, right ear: Secondary | ICD-10-CM | POA: Diagnosis not present

## 2017-05-17 DIAGNOSIS — Z23 Encounter for immunization: Secondary | ICD-10-CM | POA: Diagnosis not present

## 2017-05-17 DIAGNOSIS — E2839 Other primary ovarian failure: Secondary | ICD-10-CM | POA: Diagnosis not present

## 2017-05-17 DIAGNOSIS — E119 Type 2 diabetes mellitus without complications: Secondary | ICD-10-CM

## 2017-05-17 DIAGNOSIS — E785 Hyperlipidemia, unspecified: Secondary | ICD-10-CM

## 2017-05-17 DIAGNOSIS — K5900 Constipation, unspecified: Secondary | ICD-10-CM | POA: Diagnosis not present

## 2017-05-17 LAB — POCT GLYCOSYLATED HEMOGLOBIN (HGB A1C): HEMOGLOBIN A1C: 5.8

## 2017-05-17 NOTE — Assessment & Plan Note (Signed)
stable overall by history and exam, recent data reviewed with pt, and pt to continue medical treatment as before,  to f/u any worsening symptoms or concerns  

## 2017-05-17 NOTE — Progress Notes (Signed)
Subjective:    Patient ID: Danielle Newman, female    DOB: 1931/08/05, 83 y.o.   MRN: [id number]  HPI  Here to f/u; overall doing ok,  Pt denies chest pain, increasing sob or doe, wheezing, orthopnea, PND, increased LE swelling, palpitations, dizziness or syncope.  Pt denies new neurological symptoms such as new headache, or facial or extremity weakness or numbness.  Pt denies polydipsia, polyuria, or low sugar episode.  Pt states overall good compliance with meds, mostly trying to follow appropriate diet but little exercise however.  Had DXA this am, results pending.  Still sees retinal MD yearly as still has some left peripheral vision, but is blind right eye.  Has lost significant wt, has changed diet somewhat, believes it is intentional.  Due for flu shot. Asks for handicap parking application Wt Readings from Last 3 Encounters:  05/17/17 213 lb (96.6 kg)  11/09/16 222 lb (100.7 kg)  05/01/16 234 lb (106.1 kg)  Also has right hearing loss - ? Wax related again.  Denies worsening reflux, abd pain, dysphagia, n/v or blood, but has had worsening constipation in the past few months, has not tried OTC meds, but just not getting better with diet change.  Denies hyper or hypo thyroid symptoms such as voice, skin or hair change. Past Medical History:  Diagnosis Date  . ALLERGIC RHINITIS 02/11/2008  . ANXIETY 02/11/2008  . 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 CORONARY ARTERY DISEASE 02/11/2008  . DEPRESSION 02/11/2008  . DM (diabetes mellitus) (HCC) 11/21/2011  . DYSPNEA 09/11/2010  . GERD 02/11/2008  . HYPERLIPIDEMIA 02/11/2008  . HYPOTHYROIDISM 02/11/2008  . Impaired glucose tolerance 11/20/2011  . LUNG CANCER, HX OF 02/11/2008  . MACULAR DEGENERATION 03/29/2009  . OSTEOPENIA 02/11/2008  . Sarcoidosis 02/11/2008  . Sight impaired 04/05/2011   Past Surgical History:  Procedure Laterality Date  . s/p arm surgury  05/29/1994  . s/p left upper lobectomy      reports that she quit  smoking about 22 years ago. Her smoking use included Cigarettes. She smoked 2.00 packs per day. She has never used smokeless tobacco. She reports that she does not drink alcohol or use drugs. family history includes Allergies in her daughter; Cancer in her daughter and mother. Allergies  Allergen Reactions  . Fioricet [Butalbital-Apap-Caffeine] Other (See Comments)    confusion  . Meperidine Hcl    Current Outpatient Prescriptions on File Prior to Visit  Medication Sig Dispense Refill  . aspirin 81 MG tablet Take 81 mg by mouth daily.      . butalbital-acetaminophen-caffeine (FIORICET) 50-325-40 MG per tablet 1 tab by mouth per day as needed for migraine 20 tablet 0  . citalopram (CELEXA) 20 MG tablet TAKE ONE TABLET BY MOUTH DAILY 90 tablet 2  . diazepam (VALIUM) 10 MG tablet Take 1 tablet (10 mg total) by mouth 2 (two) times daily as needed. 60 tablet 1  . levothyroxine (SYNTHROID, LEVOTHROID) 100 MCG tablet TAKE 1 TABLET (100 MCG TOTAL) BY MOUTH DAILY. 90 tablet 3  . lovastatin (MEVACOR) 40 MG tablet TAKE ONE TABLET BY MOUTH DAILY 90 tablet 2  . omeprazole (PRILOSEC) 20 MG capsule Take 1 capsule (20 mg total) by mouth daily. 90 capsule 3   No current facility-administered medications on file prior to visit.    Review of Systems  Constitutional: Negative for other unusual diaphoresis or sweats HENT: Negative for ear discharge or swelling Eyes: Negative for other worsening visual disturbances Respiratory:  Negative for stridor or other swelling  Gastrointestinal: Negative for worsening distension or other blood Genitourinary: Negative for retention or other urinary change Musculoskeletal: Negative for other MSK pain or swelling Skin: Negative for color change or other new lesions Neurological: Negative for worsening tremors and other numbness  Psychiatric/Behavioral: Negative for worsening agitation or other fatigue All other system neg per pt    Objective:   Physical Exam BP  126/78   Pulse 83   Temp 97.9 F (36.6 C) (Oral)   Ht 5\' 7"  (1.702 m)   Wt 213 lb (96.6 kg)   SpO2 96%   BMI 33.36 kg/m  VS noted, not ill appearing Constitutional: Pt appears in NAD HENT: Head: NCAT.  Right Ear: External ear normal. + wax impaction irrigated clear Left Ear: External ear normal.  No significant wax Eyes: . Pupils are equal, round, and reactive to light. Conjunctivae and EOM are normal Nose: without d/c or deformity Neck: Neck supple. Gross normal ROM Cardiovascular: Normal rate and regular rhythm.   Pulmonary/Chest: Effort normal and breath sounds without rales or wheezing.  Abd:  Soft, NT, ND, + BS, no organomegaly Neurological: Pt is alert. At baseline orientation, motor grossly intact Skin: Skin is warm. No rashes, other new lesions, no LE edema Psychiatric: Pt behavior is normal without agitation  No other exam findings  POCT glycosylated hemoglobin (Hb A1C)  Order: [id number]  Status:  Final result Visible to patient:  No (Not Released) Dx:  Hyperlipidemia, unspecified hyperlipi...  Component 15:49  Hemoglobin A1C 5.8            Assessment & Plan:

## 2017-05-17 NOTE — Assessment & Plan Note (Signed)
D/w pt, for increased activity if possible, adequate hydration, and miralax daily prn

## 2017-05-17 NOTE — Assessment & Plan Note (Signed)
Lab Results  Component Value Date   TSH 1.87 11/09/2016  stable overall by history and exam, recent data reviewed with pt, and pt to continue medical treatment as before,  to f/u any worsening symptoms or concerns, declines f/u lab today

## 2017-05-17 NOTE — Patient Instructions (Signed)
Your right ear was irrigated of wax today  Your A1c was OK today  Please start Miralax 17 gm by mouth daily  OK to continue the otc stool softner  Please continue all other medications as before, and refills have been done if requested.  Please have the pharmacy call with any other refills you may need.  Please continue your efforts at being more active, low cholesterol diet, and weight control.  Please keep your appointments with your specialists as you may have planned  Please return in 6 months, or sooner if needed, with Lab testing done 3-5 days before

## 2017-05-17 NOTE — Assessment & Plan Note (Signed)
Lab Results  Component Value Date   CHOL 164 11/09/2016   HDL 31.20 (L) 11/09/2016   LDLDIRECT 80.0 11/09/2016   TRIG 300.0 (H) 11/09/2016   CHOLHDL 5 11/09/2016  stable overall by history and exam, recent data reviewed with pt, and pt to continue medical treatment as before,  to f/u any worsening symptoms or concerns

## 2017-05-17 NOTE — Assessment & Plan Note (Signed)
Improved with irrigation wax impaction,  to f/u any worsening symptoms or concerns  

## 2017-05-26 ENCOUNTER — Other Ambulatory Visit: Payer: Self-pay | Admitting: Internal Medicine

## 2017-05-26 MED ORDER — ALENDRONATE SODIUM 70 MG PO TABS: 70.0000 mg | ORAL_TABLET | ORAL | 3 refills | Status: DC

## 2017-05-30 ENCOUNTER — Telehealth: Payer: Self-pay

## 2017-05-30 NOTE — Telephone Encounter (Signed)
Called pt multiple times, have not received a cal back.   Notes recorded by Roney MansGay, Lexie Morini, CMA on 05/30/2017 at 7:45 AM EDT Called pt, LVM ------  Notes recorded by Roney MansGay, Yaakov Saindon, CMA on 05/29/2017 at 11:40 AM EDT Called pt, LVM ------  Notes recorded by Roney MansGay, Victorious Kundinger, CMA on 05/27/2017 at 8:18 AM EDT Called pt, LVM

## 2017-05-30 NOTE — Telephone Encounter (Signed)
-----   Message from Corwin LevinsJames W John, MD sent at 05/26/2017 12:55 PM EDT ----- Left message on MyChart, pt to cont same tx except  The test results show that your current treatment is OK, except athough the bone density testing does not show osteoporosis, it does show milder bone loss called osteopenia, and it is the level that we normally start treatment with fosamax 70 mg weekly.  I will send the prescription, and you should hear from the office as well.Danielle Newman.    Amir Glaus to please inform pt, I will do rx

## 2017-06-27 ENCOUNTER — Other Ambulatory Visit: Payer: Self-pay | Admitting: Internal Medicine

## 2017-11-22 ENCOUNTER — Other Ambulatory Visit (INDEPENDENT_AMBULATORY_CARE_PROVIDER_SITE_OTHER): Payer: Medicare Other

## 2017-11-22 ENCOUNTER — Ambulatory Visit (INDEPENDENT_AMBULATORY_CARE_PROVIDER_SITE_OTHER): Payer: Medicare Other | Admitting: Internal Medicine

## 2017-11-22 ENCOUNTER — Encounter: Payer: Self-pay | Admitting: Internal Medicine

## 2017-11-22 VITALS — BP 128/86 | HR 87 | Temp 97.7°F | Ht 67.0 in | Wt 206.0 lb

## 2017-11-22 DIAGNOSIS — E119 Type 2 diabetes mellitus without complications: Secondary | ICD-10-CM | POA: Diagnosis not present

## 2017-11-22 DIAGNOSIS — R05 Cough: Secondary | ICD-10-CM | POA: Diagnosis not present

## 2017-11-22 DIAGNOSIS — R059 Cough, unspecified: Secondary | ICD-10-CM | POA: Insufficient documentation

## 2017-11-22 DIAGNOSIS — Z0001 Encounter for general adult medical examination with abnormal findings: Secondary | ICD-10-CM | POA: Diagnosis not present

## 2017-11-22 LAB — URINALYSIS, ROUTINE W REFLEX MICROSCOPIC
Bilirubin Urine: NEGATIVE
HGB URINE DIPSTICK: NEGATIVE
KETONES UR: NEGATIVE
Leukocytes, UA: NEGATIVE
NITRITE: NEGATIVE
Total Protein, Urine: NEGATIVE
URINE GLUCOSE: NEGATIVE
UROBILINOGEN UA: 0.2 (ref 0.0–1.0)
pH: 6 (ref 5.0–8.0)

## 2017-11-22 LAB — CBC WITH DIFFERENTIAL/PLATELET
BASOS ABS: 0.1 10*3/uL (ref 0.0–0.1)
Basophils Relative: 0.8 % (ref 0.0–3.0)
EOS ABS: 0.2 10*3/uL (ref 0.0–0.7)
Eosinophils Relative: 1.6 % (ref 0.0–5.0)
HCT: 42.4 % (ref 36.0–46.0)
HEMOGLOBIN: 14.3 g/dL (ref 12.0–15.0)
LYMPHS ABS: 2.1 10*3/uL (ref 0.7–4.0)
Lymphocytes Relative: 16.9 % (ref 12.0–46.0)
MCHC: 33.7 g/dL (ref 30.0–36.0)
MCV: 86.7 fl (ref 78.0–100.0)
MONO ABS: 0.8 10*3/uL (ref 0.1–1.0)
Monocytes Relative: 6.7 % (ref 3.0–12.0)
NEUTROS PCT: 74 % (ref 43.0–77.0)
Neutro Abs: 9 10*3/uL — ABNORMAL HIGH (ref 1.4–7.7)
Platelets: 346 10*3/uL (ref 150.0–400.0)
RBC: 4.89 Mil/uL (ref 3.87–5.11)
RDW: 13.8 % (ref 11.5–15.5)
WBC: 12.1 10*3/uL — ABNORMAL HIGH (ref 4.0–10.5)

## 2017-11-22 LAB — MICROALBUMIN / CREATININE URINE RATIO
CREATININE, U: 63.7 mg/dL
Microalb Creat Ratio: 0.6 mg/g (ref 0.0–30.0)
Microalb, Ur: 0.4 mg/dL (ref 0.0–1.9)

## 2017-11-22 LAB — HEMOGLOBIN A1C: Hgb A1c MFr Bld: 5.9 % (ref 4.6–6.5)

## 2017-11-22 LAB — TSH: TSH: 1.86 u[IU]/mL (ref 0.35–4.50)

## 2017-11-22 LAB — LIPID PANEL
Cholesterol: 135 mg/dL (ref 0–200)
HDL: 36.7 mg/dL — AB (ref >39.00)
LDL Cholesterol: 59 mg/dL (ref 0–99)
NONHDL: 98.69
Total CHOL/HDL Ratio: 4
Triglycerides: 197 mg/dL — ABNORMAL HIGH (ref 0.0–149.0)
VLDL: 39.4 mg/dL (ref 0.0–40.0)

## 2017-11-22 LAB — BASIC METABOLIC PANEL
BUN: 15 mg/dL (ref 6–23)
CALCIUM: 9.3 mg/dL (ref 8.4–10.5)
CO2: 29 meq/L (ref 19–32)
Chloride: 101 mEq/L (ref 96–112)
Creatinine, Ser: 1.09 mg/dL (ref 0.40–1.20)
GFR: 50.85 mL/min — ABNORMAL LOW (ref >60.00)
GLUCOSE: 101 mg/dL — AB (ref 70–99)
Potassium: 4.7 mEq/L (ref 3.5–5.1)
SODIUM: 139 meq/L (ref 135–145)

## 2017-11-22 LAB — HEPATIC FUNCTION PANEL
ALK PHOS: 65 U/L (ref 39–117)
ALT: 13 U/L (ref 0–35)
AST: 13 U/L (ref 0–37)
Albumin: 4 g/dL (ref 3.5–5.2)
BILIRUBIN DIRECT: 0.2 mg/dL (ref 0.0–0.3)
BILIRUBIN TOTAL: 0.6 mg/dL (ref 0.2–1.2)
Total Protein: 7.8 g/dL (ref 6.0–8.3)

## 2017-11-22 MED ORDER — AZITHROMYCIN 250 MG PO TABS: ORAL_TABLET | ORAL | 1 refills | Status: DC

## 2017-11-22 MED ORDER — HYDROCODONE-HOMATROPINE 5-1.5 MG/5ML PO SYRP: 5.0000 mL | ORAL_SOLUTION | Freq: Four times a day (QID) | ORAL | 0 refills | Status: AC | PRN

## 2017-11-22 NOTE — Patient Instructions (Addendum)
Please take all new medication as prescribed - the antibiotic, and cough medicine if needed  Please continue all other medications as before, and refills have been done if requested.  Please have the pharmacy call with any other refills you may need.  Please continue your efforts at being more active, low cholesterol diet, and weight control.  You are otherwise up to date with prevention measures today.  Please keep your appointments with your specialists as you may have planned  Please return in 6 months, or sooner if needed, with Lab testing done 3-5 days before

## 2017-11-22 NOTE — Assessment & Plan Note (Signed)

## 2017-11-22 NOTE — Progress Notes (Signed)
Subjective:    Patient ID: Danielle Newman, female    DOB: 03/03/1932, 84 y.o.   MRN: [id number]  HPI  Here for wellness and f/u;  Overall doing ok;    Pt denies neurological change such as new headache, facial or extremity weakness.  Pt denies polydipsia, polyuria, or low sugar symptoms. Pt states overall good compliance with treatment and medications, good tolerability, and has been trying to follow appropriate diet.  Pt denies worsening depressive symptoms, suicidal ideation or panic. No fever, night sweats, wt loss, loss of appetite, or other constitutional symptoms.  Pt states good ability with ADL's, has low fall risk, home safety reviewed and adequate, no other significant changes in hearing or vision, and not active with exercise. Also c/o Bad cough, mod x 2-3 days ST, HA, general weakness and malaise, with prod cough greenish sputum, but Pt denies chest pain, increased sob or doe, wheezing, orthopnea, PND, increased LE swelling, palpitations, dizziness or syncope. Does have several wks ongoing nasal allergy symptoms with clearish congestion, itch and sneezing, without fever, pain, ST, cough, swelling or wheezing. Past Medical History:  Diagnosis Date  . ALLERGIC RHINITIS 02/11/2008  . ANXIETY 02/11/2008  . 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 CORONARY ARTERY DISEASE 02/11/2008  . DEPRESSION 02/11/2008  . DM (diabetes mellitus) (HCC) 11/21/2011  . DYSPNEA 09/11/2010  . GERD 02/11/2008  . HYPERLIPIDEMIA 02/11/2008  . HYPOTHYROIDISM 02/11/2008  . Impaired glucose tolerance 11/20/2011  . LUNG CANCER, HX OF 02/11/2008  . MACULAR DEGENERATION 03/29/2009  . OSTEOPENIA 02/11/2008  . Sarcoidosis 02/11/2008  . Sight impaired 04/05/2011   Past Surgical History:  Procedure Laterality Date  . s/p arm surgury  05/29/1994  . s/p left upper lobectomy      reports that she quit smoking about 23 years ago. Her smoking use included cigarettes. She smoked 2.00 packs per day. She has never used  smokeless tobacco. She reports that she does not drink alcohol or use drugs. family history includes Allergies in her daughter; Cancer in her daughter and mother. Allergies  Allergen Reactions  . Fioricet [Butalbital-Apap-Caffeine] Other (See Comments)    confusion  . Meperidine Hcl    Current Outpatient Medications on File Prior to Visit  Medication Sig Dispense Refill  . alendronate (FOSAMAX) 70 MG tablet Take 1 tablet (70 mg total) by mouth every 7 (seven) days. Take with a full glass of water on an empty stomach. 12 tablet 3  . aspirin 81 MG tablet Take 81 mg by mouth daily.      . butalbital-acetaminophen-caffeine (FIORICET) 50-325-40 MG per tablet 1 tab by mouth per day as needed for migraine 20 tablet 0  . citalopram (CELEXA) 20 MG tablet TAKE ONE TABLET BY MOUTH DAILY 90 tablet 2  . diazepam (VALIUM) 10 MG tablet Take 1 tablet (10 mg total) by mouth 2 (two) times daily as needed. 60 tablet 1  . levothyroxine (SYNTHROID, LEVOTHROID) 100 MCG tablet TAKE 1 TABLET (100 MCG TOTAL) BY MOUTH DAILY. 90 tablet 3  . lovastatin (MEVACOR) 40 MG tablet TAKE ONE TABLET BY MOUTH DAILY 90 tablet 1  . omeprazole (PRILOSEC) 20 MG capsule Take 1 capsule (20 mg total) by mouth daily. 90 capsule 3   No current facility-administered medications on file prior to visit.    Review of Systems Constitutional: Negative for other unusual diaphoresis, sweats, appetite or weight changes HENT: Negative for other worsening hearing loss, ear pain, facial swelling, mouth sores or neck  stiffness.   Eyes: Negative for other worsening pain, redness or other visual disturbance.  Respiratory: Negative for other stridor or swelling Cardiovascular: Negative for other palpitations or other chest pain  Gastrointestinal: Negative for worsening diarrhea or loose stools, blood in stool, distention or other pain Genitourinary: Negative for hematuria, flank pain or other change in urine volume.  Musculoskeletal: Negative for  myalgias or other joint swelling.  Skin: Negative for other color change, or other wound or worsening drainage.  Neurological: Negative for other syncope or numbness. Hematological: Negative for other adenopathy or swelling Psychiatric/Behavioral: Negative for hallucinations, other worsening agitation, SI, self-injury, or new decreased concentration All other system neg per pt    Objective:   Physical Exam BP 128/86   Pulse 87   Temp 97.7 F (36.5 C) (Oral)   Ht 5\' 7"  (1.702 m)   Wt 206 lb (93.4 kg)   SpO2 98%   BMI 32.26 kg/m  VS noted,  Constitutional: Pt is oriented to person, place, and time. Appears well-developed and well-nourished, in no significant distress and comfortable Head: Normocephalic and atraumatic  Eyes: Conjunctivae and EOM are normal. Pupils are equal, round, and reactive to light Right Ear: External ear normal without discharge Left Ear: External ear normal without discharge Nose: Nose without discharge or deformity Mouth/Throat: Oropharynx is without other ulcerations and moist  Neck: Normal range of motion. Neck supple. No JVD present. No tracheal deviation present or significant neck LA or mass Cardiovascular: Normal rate, regular rhythm, normal heart sounds and intact distal pulses.   Pulmonary/Chest: WOB normal and breath sounds without rales or wheezing  Abdominal: Soft. Bowel sounds are normal. NT. No HSM  Musculoskeletal: Normal range of motion. Exhibits no edema Lymphadenopathy: Has no other cervical adenopathy.  Neurological: Pt is alert and oriented to person, place, and time. Pt has normal reflexes. No cranial nerve deficit except low vision bilat,. Motor grossly intact, Gait intact Skin: Skin is warm and dry. No rash noted or new ulcerations Psychiatric:  Has normal mood and affect. Behavior is normal without agitation No other exam findings       Assessment & Plan:

## 2017-11-22 NOTE — Assessment & Plan Note (Addendum)
Mild to mod, c/w bronchitis vs pna, declines cxr,  for antibx course, cough med prn,  to f/u any worsening symptoms or concerns  In addition to the time spent performing CPE, I spent an additional 15 minutes face to face,in which greater than 50% of this time was spent in counseling and coordination of care for patient's illness as documented, including the differential dx, treatment, further evaluation and other management of cough and DM

## 2017-11-22 NOTE — Assessment & Plan Note (Signed)
Lab Results  Component Value Date   HGBA1C 5.9 11/22/2017  stable overall by history and exam, recent data reviewed with pt, and pt to continue medical treatment as before,  to f/u any worsening symptoms or concerns

## 2017-11-26 DIAGNOSIS — H31009 Unspecified chorioretinal scars, unspecified eye: Secondary | ICD-10-CM | POA: Diagnosis not present

## 2017-11-26 DIAGNOSIS — H2013 Chronic iridocyclitis, bilateral: Secondary | ICD-10-CM | POA: Diagnosis not present

## 2017-11-26 DIAGNOSIS — H353232 Exudative age-related macular degeneration, bilateral, with inactive choroidal neovascularization: Secondary | ICD-10-CM | POA: Diagnosis not present

## 2017-11-26 DIAGNOSIS — H353134 Nonexudative age-related macular degeneration, bilateral, advanced atrophic with subfoveal involvement: Secondary | ICD-10-CM | POA: Diagnosis not present

## 2017-11-26 DIAGNOSIS — D869 Sarcoidosis, unspecified: Secondary | ICD-10-CM | POA: Diagnosis not present

## 2017-12-25 ENCOUNTER — Other Ambulatory Visit: Payer: Self-pay | Admitting: Internal Medicine

## 2018-03-23 ENCOUNTER — Other Ambulatory Visit: Payer: Self-pay | Admitting: Internal Medicine

## 2018-05-16 ENCOUNTER — Ambulatory Visit (INDEPENDENT_AMBULATORY_CARE_PROVIDER_SITE_OTHER): Payer: Medicare Other | Admitting: Internal Medicine

## 2018-05-16 ENCOUNTER — Encounter: Payer: Self-pay | Admitting: Internal Medicine

## 2018-05-16 ENCOUNTER — Other Ambulatory Visit (INDEPENDENT_AMBULATORY_CARE_PROVIDER_SITE_OTHER): Payer: Medicare Other

## 2018-05-16 VITALS — BP 126/82 | HR 80 | Temp 97.8°F | Ht 67.0 in | Wt 213.0 lb

## 2018-05-16 DIAGNOSIS — E119 Type 2 diabetes mellitus without complications: Secondary | ICD-10-CM | POA: Diagnosis not present

## 2018-05-16 DIAGNOSIS — Z23 Encounter for immunization: Secondary | ICD-10-CM | POA: Diagnosis not present

## 2018-05-16 DIAGNOSIS — N183 Chronic kidney disease, stage 3 unspecified: Secondary | ICD-10-CM

## 2018-05-16 DIAGNOSIS — H9193 Unspecified hearing loss, bilateral: Secondary | ICD-10-CM

## 2018-05-16 DIAGNOSIS — E785 Hyperlipidemia, unspecified: Secondary | ICD-10-CM

## 2018-05-16 DIAGNOSIS — E038 Other specified hypothyroidism: Secondary | ICD-10-CM

## 2018-05-16 LAB — LIPID PANEL
Cholesterol: 168 mg/dL (ref 0–200)
HDL: 34.3 mg/dL — ABNORMAL LOW (ref >39.00)
NonHDL: 133.31
Total CHOL/HDL Ratio: 5
Triglycerides: 265 mg/dL — ABNORMAL HIGH (ref 0.0–149.0)
VLDL: 53 mg/dL — AB (ref 0.0–40.0)

## 2018-05-16 LAB — BASIC METABOLIC PANEL
BUN: 22 mg/dL (ref 6–23)
CO2: 29 mEq/L (ref 19–32)
Calcium: 9.6 mg/dL (ref 8.4–10.5)
Chloride: 102 mEq/L (ref 96–112)
Creatinine, Ser: 1.04 mg/dL (ref 0.40–1.20)
GFR: 53.62 mL/min — AB (ref >60.00)
Glucose, Bld: 98 mg/dL (ref 70–99)
POTASSIUM: 4.5 meq/L (ref 3.5–5.1)
Sodium: 139 mEq/L (ref 135–145)

## 2018-05-16 LAB — HEPATIC FUNCTION PANEL
ALBUMIN: 4.1 g/dL (ref 3.5–5.2)
ALT: 14 U/L (ref 0–35)
AST: 14 U/L (ref 0–37)
Alkaline Phosphatase: 69 U/L (ref 39–117)
Bilirubin, Direct: 0.1 mg/dL (ref 0.0–0.3)
Total Bilirubin: 0.5 mg/dL (ref 0.2–1.2)
Total Protein: 8 g/dL (ref 6.0–8.3)

## 2018-05-16 LAB — TSH: TSH: 1.46 u[IU]/mL (ref 0.35–4.50)

## 2018-05-16 LAB — HEMOGLOBIN A1C: Hgb A1c MFr Bld: 5.8 % (ref 4.6–6.5)

## 2018-05-16 LAB — LDL CHOLESTEROL, DIRECT: Direct LDL: 100 mg/dL

## 2018-05-16 NOTE — Progress Notes (Signed)
Subjective:    Patient ID: Danielle Newman, female    DOB: 07-Jun-1932, 84 y.o.   MRN: [id number]  HPI  Here to f/u; overall doing ok,  Pt denies chest pain, increasing sob or doe, wheezing, orthopnea, PND, increased LE swelling, palpitations, dizziness or syncope.  Pt denies new neurological symptoms such as new headache, or facial or extremity weakness or numbness.  Pt denies polydipsia, polyuria, or low sugar episode.  Pt states overall good compliance with meds, mostly trying to follow appropriate diet, with wt overall stable,  but little exercise however.  Denies hyper or hypo thyroid symptoms such as voice, skin or hair change.  Does have bilat hearing loss worse in the past 6 mo, family complains about TV too loud every day now.  Has recurrent wax issues.   Pt denies fever, wt loss, night sweats, loss of appetite, or other constitutional symptoms No other new complaints Past Medical History:  Diagnosis Date  . ALLERGIC RHINITIS 02/11/2008  . ANXIETY 02/11/2008  . 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 CORONARY ARTERY DISEASE 02/11/2008  . DEPRESSION 02/11/2008  . DM (diabetes mellitus) (HCC) 11/21/2011  . DYSPNEA 09/11/2010  . GERD 02/11/2008  . HYPERLIPIDEMIA 02/11/2008  . HYPOTHYROIDISM 02/11/2008  . Impaired glucose tolerance 11/20/2011  . LUNG CANCER, HX OF 02/11/2008  . MACULAR DEGENERATION 03/29/2009  . OSTEOPENIA 02/11/2008  . Sarcoidosis 02/11/2008  . Sight impaired 04/05/2011   Past Surgical History:  Procedure Laterality Date  . s/p arm surgury  05/29/1994  . s/p left upper lobectomy      reports that she quit smoking about 23 years ago. Her smoking use included cigarettes. She smoked 2.00 packs per day. She has never used smokeless tobacco. She reports that she does not drink alcohol or use drugs. family history includes Allergies in her daughter; Cancer in her daughter and mother. Allergies  Allergen Reactions  . Fioricet [Butalbital-Apap-Caffeine] Other (See  Comments)    confusion  . Meperidine Hcl    Current Outpatient Medications on File Prior to Visit  Medication Sig Dispense Refill  . alendronate (FOSAMAX) 70 MG tablet Take 1 tablet (70 mg total) by mouth every 7 (seven) days. Take with a full glass of water on an empty stomach. 12 tablet 3  . aspirin 81 MG tablet Take 81 mg by mouth daily.      Marland Kitchen azithromycin (ZITHROMAX Z-PAK) 250 MG tablet 2 tab by mouth day 1, then 1 per day 6 tablet 1  . butalbital-acetaminophen-caffeine (FIORICET) 50-325-40 MG per tablet 1 tab by mouth per day as needed for migraine 20 tablet 0  . citalopram (CELEXA) 20 MG tablet TAKE ONE TABLET BY MOUTH DAILY 90 tablet 1  . diazepam (VALIUM) 10 MG tablet Take 1 tablet (10 mg total) by mouth 2 (two) times daily as needed. 60 tablet 1  . levothyroxine (SYNTHROID, LEVOTHROID) 100 MCG tablet TAKE 1 TABLET (100 MCG TOTAL) BY MOUTH DAILY. 90 tablet 2  . lovastatin (MEVACOR) 40 MG tablet TAKE ONE TABLET BY MOUTH DAILY 90 tablet 2  . omeprazole (PRILOSEC) 20 MG capsule Take 1 capsule (20 mg total) by mouth daily. 90 capsule 3   No current facility-administered medications on file prior to visit.    Review of Systems  Constitutional: Negative for other unusual diaphoresis or sweats HENT: Negative for ear discharge or swelling Eyes: Negative for other worsening visual disturbances Respiratory: Negative for stridor or other swelling  Gastrointestinal: Negative for worsening  distension or other blood Genitourinary: Negative for retention or other urinary change Musculoskeletal: Negative for other MSK pain or swelling Skin: Negative for color change or other new lesions Neurological: Negative for worsening tremors and other numbness  Psychiatric/Behavioral: Negative for worsening agitation or other fatigue All other system neg per pt    Objective:   Physical Exam BP 126/82   Pulse 80   Temp 97.8 F (36.6 C) (Oral)   Ht 5\' 7"  (1.702 m)   Wt 213 lb (96.6 kg)   SpO2 94%    BMI 33.36 kg/m  VS noted,  Constitutional: Pt appears in NAD HENT: Head: NCAT.  Right Ear: External ear normal.  Left Ear: External ear normal.  Eyes: . Pupils are equal, round, and reactive to light. Conjunctivae and EOM are normal Nose: without d/c or deformity Neck: Neck supple. Gross normal ROM Cardiovascular: Normal rate and regular rhythm.   Pulmonary/Chest: Effort normal and breath sounds without rales or wheezing.  Abd:  Soft, NT, ND, + BS, no organomegaly Neurological: Pt is alert. At baseline orientation, motor grossly intact Skin: Skin is warm. No rashes, other new lesions, no LE edema Psychiatric: Pt behavior is normal without agitation  No other exam findings Lab Results  Component Value Date   WBC 12.1 (H) 11/22/2017   HGB 14.3 11/22/2017   HCT 42.4 11/22/2017   PLT 346.0 11/22/2017   GLUCOSE 98 05/16/2018   CHOL 168 05/16/2018   TRIG 265.0 (H) 05/16/2018   HDL 34.30 (L) 05/16/2018   LDLDIRECT 100.0 05/16/2018   LDLCALC 59 11/22/2017   ALT 14 05/16/2018   AST 14 05/16/2018   NA 139 05/16/2018   K 4.5 05/16/2018   CL 102 05/16/2018   CREATININE 1.04 05/16/2018   BUN 22 05/16/2018   CO2 29 05/16/2018   TSH 1.46 05/16/2018   HGBA1C 5.8 05/16/2018   MICROALBUR 0.4 11/22/2017       Assessment & Plan:

## 2018-05-16 NOTE — Patient Instructions (Signed)
You had the flu shot today  Your ears were irrigated of wax today  You will be contacted regarding the referral for: audiology  OK to wear the compression stocking on the right leg during the day only (20-30 mmHg strength, knee high only)  Please continue all other medications as before, and refills have been done if requested.  Please have the pharmacy call with any other refills you may need.  Please continue your efforts at being more active, low cholesterol diet, and weight control.  Please keep your appointments with your specialists as you may have planned  Please go to the LAB in the Basement (turn left off the elevator) for the tests to be done today  You will be contacted by phone if any changes need to be made immediately.  Otherwise, you will receive a letter about your results with an explanation, but please check with MyChart first.  Please remember to sign up for MyChart if you have not done so, as this will be important to you in the future with finding out test results, communicating by private email, and scheduling acute appointments online when needed.  Please return in 6 months, or sooner if needed

## 2018-05-17 DIAGNOSIS — H9193 Unspecified hearing loss, bilateral: Secondary | ICD-10-CM | POA: Insufficient documentation

## 2018-05-17 NOTE — Assessment & Plan Note (Signed)
stable overall by history and exam, recent data reviewed with pt, and pt to continue medical treatment as before,  to f/u any worsening symptoms or concerns  

## 2018-05-17 NOTE — Assessment & Plan Note (Signed)
Improved with wax irrigation but still significant, for referral audiology

## 2018-09-30 ENCOUNTER — Other Ambulatory Visit: Payer: Self-pay | Admitting: Internal Medicine

## 2018-11-14 ENCOUNTER — Ambulatory Visit: Payer: Medicare Other | Admitting: Internal Medicine

## 2018-12-24 ENCOUNTER — Other Ambulatory Visit: Payer: Self-pay | Admitting: Internal Medicine

## 2019-01-27 ENCOUNTER — Encounter: Payer: Self-pay | Admitting: Internal Medicine

## 2019-01-27 ENCOUNTER — Ambulatory Visit (INDEPENDENT_AMBULATORY_CARE_PROVIDER_SITE_OTHER): Payer: Medicare Other | Admitting: Internal Medicine

## 2019-01-27 DIAGNOSIS — E538 Deficiency of other specified B group vitamins: Secondary | ICD-10-CM

## 2019-01-27 DIAGNOSIS — E559 Vitamin D deficiency, unspecified: Secondary | ICD-10-CM

## 2019-01-27 DIAGNOSIS — M79661 Pain in right lower leg: Secondary | ICD-10-CM | POA: Diagnosis not present

## 2019-01-27 DIAGNOSIS — E611 Iron deficiency: Secondary | ICD-10-CM | POA: Diagnosis not present

## 2019-01-27 DIAGNOSIS — M7989 Other specified soft tissue disorders: Secondary | ICD-10-CM

## 2019-01-27 DIAGNOSIS — E119 Type 2 diabetes mellitus without complications: Secondary | ICD-10-CM | POA: Diagnosis not present

## 2019-01-27 DIAGNOSIS — E038 Other specified hypothyroidism: Secondary | ICD-10-CM

## 2019-01-27 DIAGNOSIS — Z0001 Encounter for general adult medical examination with abnormal findings: Secondary | ICD-10-CM

## 2019-01-27 NOTE — Assessment & Plan Note (Signed)
stable overall by history and exam, recent data reviewed with pt, and pt to continue medical treatment as before,  to f/u any worsening symptoms or concerns  

## 2019-01-27 NOTE — Progress Notes (Signed)
Patient ID: Danielle Newman, female   DOB: 1931/12/11, 85 y.o.   MRN: [id number]  Virtual Visit via Video Note  I connected with Danielle Newman on 01/27/19 at  7:40 PM EDT by a video enabled telemedicine application and verified that I am speaking with the correct person using two identifiers.  Location: Patient: at home Provider: at office   I discussed the limitations of evaluation and management by telemedicine and the availability of in person appointments. The patient expressed understanding and agreed to proceed.  History of Present Illness: Here for wellness and f/u, overall doing ok;  Pt denies wheezing, orthopnea, PND, palpitations, dizziness or syncope.  Pt denies neurological change such as new headache, facial or extremity weakness.  Pt denies polydipsia, polyuria, or low sugar symptoms. Pt states overall good compliance with treatment and medications, good tolerability, and has been trying to follow appropriate diet.  Pt denies worsening depressive symptoms, suicidal ideation or panic. No fever, night sweats, wt loss, loss of appetite, or other constitutional symptoms.  Pt states good ability with ADL's, has low fall risk, home safety reviewed and adequate, no other significant changes in hearing or vision, and not active with exercise. Denies hyper or hypo thyroid symptoms such as voice, skin or hair change. Also here with c/o 1 wk onset right leg swelling and pain mostly to the post knee and calf and medial upper calf soreness as well; sudden onset, moderate, constant, worse to sit up with legs down, worse pain to walk (though mild to mod overall), nothing else makes better or worse.  No fever, trauma, though does recall a possible twisting of the knee with turning over in bed the morning that the all the symptoms seemed to start. Has rather large right foot and ankle swelling in addition to the leg to above the knee, but only chronic trace on the left foot only.  Denies CP, SOB, fever,  chills.  Declines ED for now   Past Medical History:  Diagnosis Date  . ALLERGIC RHINITIS 02/11/2008  . ANXIETY 02/11/2008  . 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 CORONARY ARTERY DISEASE 02/11/2008  . DEPRESSION 02/11/2008  . DM (diabetes mellitus) ([facility]) 11/21/2011  . DYSPNEA 09/11/2010  . GERD 02/11/2008  . HYPERLIPIDEMIA 02/11/2008  . HYPOTHYROIDISM 02/11/2008  . Impaired glucose tolerance 11/20/2011  . LUNG CANCER, HX OF 02/11/2008  . MACULAR DEGENERATION 03/29/2009  . OSTEOPENIA 02/11/2008  . Sarcoidosis 02/11/2008  . Sight impaired 04/05/2011   Past Surgical History:  Procedure Laterality Date  . s/p arm surgury  05/29/1994  . s/p left upper lobectomy      reports that she quit smoking about 24 years ago. Her smoking use included cigarettes. She smoked 2.00 packs per day. She has never used smokeless tobacco. She reports that she does not drink alcohol or use drugs. family history includes Allergies in her daughter; Cancer in her daughter and mother. Allergies  Allergen Reactions  . Fioricet [Butalbital-Apap-Caffeine] Other (See Comments)    confusion  . Meperidine Hcl    Current Outpatient Medications on File Prior to Visit  Medication Sig Dispense Refill  . alendronate (FOSAMAX) 70 MG tablet Take 1 tablet (70 mg total) by mouth every 7 (seven) days. Take with a full glass of water on an empty stomach. 12 tablet 3  . aspirin 81 MG tablet Take 81 mg by mouth daily.      . butalbital-acetaminophen-caffeine (FIORICET) 50-325-40 MG per tablet 1 tab  by mouth per day as needed for migraine 20 tablet 0  . citalopram (CELEXA) 20 MG tablet TAKE ONE TABLET BY MOUTH DAILY 90 tablet 1  . diazepam (VALIUM) 10 MG tablet Take 1 tablet (10 mg total) by mouth 2 (two) times daily as needed. 60 tablet 1  . levothyroxine (SYNTHROID) 100 MCG tablet TAKE 1 TABLET (100 MCG TOTAL) BY MOUTH DAILY. 90 tablet 1  . lovastatin (MEVACOR) 40 MG tablet TAKE ONE TABLET BY MOUTH DAILY 90 tablet 1  .  omeprazole (PRILOSEC) 20 MG capsule Take 1 capsule (20 mg total) by mouth daily. 90 capsule 3   No current facility-administered medications on file prior to visit.    Observations/Objective: Alert, NAD, appropriate mood and affect, resps normal, cn 2-12 intact, moves all 4s, no visible rash but has 2-3+ edema to RLE worst at the foot but extending to above the knee, without appreciable skin change Lab Results  Component Value Date   WBC 12.1 (H) 11/22/2017   HGB 14.3 11/22/2017   HCT 42.4 11/22/2017   PLT 346.0 11/22/2017   GLUCOSE 98 05/16/2018   CHOL 168 05/16/2018   TRIG 265.0 (H) 05/16/2018   HDL 34.30 (L) 05/16/2018   LDLDIRECT 100.0 05/16/2018   LDLCALC 59 11/22/2017   ALT 14 05/16/2018   AST 14 05/16/2018   NA 139 05/16/2018   K 4.5 05/16/2018   CL 102 05/16/2018   CREATININE 1.04 05/16/2018   BUN 22 05/16/2018   CO2 29 05/16/2018   TSH 1.46 05/16/2018   HGBA1C 5.8 05/16/2018   MICROALBUR 0.4 11/22/2017   Assessment and Plan: See notes  Follow Up Instructions: See notes   I discussed the assessment and treatment plan with the patient. The patient was provided an opportunity to ask questions and all were answered. The patient agreed with the plan and demonstrated an understanding of the instructions.   The patient was advised to call back or seek an in-person evaluation if the symptoms worsen or if the condition fails to improve as anticipated.   Oliver BarreJames Juelz Whittenberg, MD

## 2019-01-27 NOTE — Assessment & Plan Note (Signed)

## 2019-01-27 NOTE — Assessment & Plan Note (Addendum)
Differential includes DVT acute vs ruptured bakers cyst; for venous doppler u/s asap, and consider sport med referral if neg for DVT  In addition to the time spent performing CPE, I spent an additional 25 minutes face to face,in which greater than 50% of this time was spent in counseling and coordination of care for patient's acute illness as documented, including the differential dx, treatment, further evaluation and other management of RLE pain and swelling, DM and hypothyroidism

## 2019-01-27 NOTE — Patient Instructions (Signed)
Please continue all other medications as before, and refills have been done if requested.  Please have the pharmacy call with any other refills you may need.  Please continue your efforts at being more active, low cholesterol diet, and weight control.  You are otherwise up to date with prevention measures today.  Please keep your appointments with your specialists as you may have planned  You will be contacted regarding the referral for: Venous doppler u/s asap  If this is negative for DVT, please consider follow up appt with Dr Smith/sports medicine to look closer at the knee  Please go to the LAB in the Basement (turn left off the elevator) for the tests to be done tomorrow  You will be contacted by phone if any changes need to be made immediately.  Otherwise, you will receive a letter about your results with an explanation, but please check with MyChart first.  Please remember to sign up for MyChart if you have not done so, as this will be important to you in the future with finding out test results, communicating by private email, and scheduling acute appointments online when needed.  Please return in 6 months, or sooner if needed, with Lab testing done 3-5 days before

## 2019-01-28 ENCOUNTER — Other Ambulatory Visit: Payer: Self-pay | Admitting: Internal Medicine

## 2019-01-28 ENCOUNTER — Ambulatory Visit (HOSPITAL_COMMUNITY)
Admission: RE | Admit: 2019-01-28 | Discharge: 2019-01-28 | Disposition: A | Discharge status: Home / Self Care | Payer: Medicare Other | Source: Ambulatory Visit | Attending: Internal Medicine | Admitting: Internal Medicine

## 2019-01-28 ENCOUNTER — Other Ambulatory Visit: Payer: Self-pay

## 2019-01-28 ENCOUNTER — Encounter: Payer: Self-pay | Admitting: Internal Medicine

## 2019-01-28 ENCOUNTER — Telehealth: Payer: Self-pay

## 2019-01-28 ENCOUNTER — Telehealth (HOSPITAL_COMMUNITY): Payer: Self-pay | Admitting: *Deleted

## 2019-01-28 ENCOUNTER — Telehealth (HOSPITAL_COMMUNITY): Payer: Self-pay | Admitting: Rehabilitation

## 2019-01-28 ENCOUNTER — Other Ambulatory Visit (INDEPENDENT_AMBULATORY_CARE_PROVIDER_SITE_OTHER): Payer: Medicare Other

## 2019-01-28 DIAGNOSIS — Z0001 Encounter for general adult medical examination with abnormal findings: Secondary | ICD-10-CM | POA: Diagnosis not present

## 2019-01-28 DIAGNOSIS — E119 Type 2 diabetes mellitus without complications: Secondary | ICD-10-CM | POA: Diagnosis not present

## 2019-01-28 DIAGNOSIS — M25561 Pain in right knee: Secondary | ICD-10-CM

## 2019-01-28 DIAGNOSIS — E611 Iron deficiency: Secondary | ICD-10-CM | POA: Diagnosis not present

## 2019-01-28 DIAGNOSIS — M79661 Pain in right lower leg: Secondary | ICD-10-CM | POA: Diagnosis not present

## 2019-01-28 DIAGNOSIS — M7989 Other specified soft tissue disorders: Secondary | ICD-10-CM | POA: Diagnosis not present

## 2019-01-28 DIAGNOSIS — E538 Deficiency of other specified B group vitamins: Secondary | ICD-10-CM

## 2019-01-28 DIAGNOSIS — E559 Vitamin D deficiency, unspecified: Secondary | ICD-10-CM

## 2019-01-28 LAB — BASIC METABOLIC PANEL
BUN: 22 mg/dL (ref 6–23)
CO2: 28 mEq/L (ref 19–32)
Calcium: 9.5 mg/dL (ref 8.4–10.5)
Chloride: 102 mEq/L (ref 96–112)
Creatinine, Ser: 1.14 mg/dL (ref 0.40–1.20)
GFR: 45.3 mL/min — ABNORMAL LOW (ref >60.00)
Glucose, Bld: 93 mg/dL (ref 70–99)
Potassium: 4.8 mEq/L (ref 3.5–5.1)
Sodium: 140 mEq/L (ref 135–145)

## 2019-01-28 LAB — URINALYSIS, ROUTINE W REFLEX MICROSCOPIC
Bilirubin Urine: NEGATIVE
Ketones, ur: NEGATIVE
Nitrite: POSITIVE — AB
Specific Gravity, Urine: 1.02 (ref 1.000–1.030)
Total Protein, Urine: NEGATIVE
Urine Glucose: NEGATIVE
Urobilinogen, UA: 0.2 (ref 0.0–1.0)
pH: 5.5 (ref 5.0–8.0)

## 2019-01-28 LAB — CBC WITH DIFFERENTIAL/PLATELET
Basophils Absolute: 0.1 10*3/uL (ref 0.0–0.1)
Basophils Relative: 1.2 % (ref 0.0–3.0)
Eosinophils Absolute: 0.3 10*3/uL (ref 0.0–0.7)
Eosinophils Relative: 2.9 % (ref 0.0–5.0)
HCT: 42.6 % (ref 36.0–46.0)
Hemoglobin: 14.4 g/dL (ref 12.0–15.0)
Lymphocytes Relative: 18.5 % (ref 12.0–46.0)
Lymphs Abs: 1.8 10*3/uL (ref 0.7–4.0)
MCHC: 33.9 g/dL (ref 30.0–36.0)
MCV: 88.8 fl (ref 78.0–100.0)
Monocytes Absolute: 0.8 10*3/uL (ref 0.1–1.0)
Monocytes Relative: 8.4 % (ref 3.0–12.0)
Neutro Abs: 6.7 10*3/uL (ref 1.4–7.7)
Neutrophils Relative %: 69 % (ref 43.0–77.0)
Platelets: 283 10*3/uL (ref 150.0–400.0)
RBC: 4.79 Mil/uL (ref 3.87–5.11)
RDW: 13.8 % (ref 11.5–15.5)
WBC: 9.7 10*3/uL (ref 4.0–10.5)

## 2019-01-28 LAB — LIPID PANEL
Cholesterol: 153 mg/dL (ref 0–200)
HDL: 42 mg/dL (ref >39.00)
LDL Cholesterol: 80 mg/dL (ref 0–99)
NonHDL: 110.74
Total CHOL/HDL Ratio: 4
Triglycerides: 152 mg/dL — ABNORMAL HIGH (ref 0.0–149.0)
VLDL: 30.4 mg/dL (ref 0.0–40.0)

## 2019-01-28 LAB — IBC PANEL
Iron: 58 ug/dL (ref 42–145)
Saturation Ratios: 17.5 % — ABNORMAL LOW (ref 20.0–50.0)
Transferrin: 237 mg/dL (ref 212.0–360.0)

## 2019-01-28 LAB — HEPATIC FUNCTION PANEL
ALT: 13 U/L (ref 0–35)
AST: 15 U/L (ref 0–37)
Albumin: 4.2 g/dL (ref 3.5–5.2)
Alkaline Phosphatase: 73 U/L (ref 39–117)
Bilirubin, Direct: 0.1 mg/dL (ref 0.0–0.3)
Total Bilirubin: 0.5 mg/dL (ref 0.2–1.2)
Total Protein: 8 g/dL (ref 6.0–8.3)

## 2019-01-28 LAB — VITAMIN B12: Vitamin B-12: 344 pg/mL (ref 211–911)

## 2019-01-28 LAB — TSH: TSH: 1.65 u[IU]/mL (ref 0.35–4.50)

## 2019-01-28 LAB — MICROALBUMIN / CREATININE URINE RATIO
Creatinine,U: 76.3 mg/dL
Microalb Creat Ratio: 4.8 mg/g (ref 0.0–30.0)
Microalb, Ur: 3.6 mg/dL — ABNORMAL HIGH (ref 0.0–1.9)

## 2019-01-28 LAB — VITAMIN D 25 HYDROXY (VIT D DEFICIENCY, FRACTURES): VITD: 18.63 ng/mL — ABNORMAL LOW (ref 30.00–100.00)

## 2019-01-28 LAB — HEMOGLOBIN A1C: Hgb A1c MFr Bld: 5.9 % (ref 4.6–6.5)

## 2019-01-28 MED ORDER — VITAMIN D (ERGOCALCIFEROL) 1.25 MG (50000 UNIT) PO CAPS: 50000.0000 [IU] | ORAL_CAPSULE | ORAL | 0 refills | Status: DC

## 2019-01-28 NOTE — Telephone Encounter (Signed)
Attempted to call prelim report to triage nurse. On-hold for >15 minutes.

## 2019-01-28 NOTE — Telephone Encounter (Signed)
Done erx 

## 2019-01-28 NOTE — Telephone Encounter (Signed)

## 2019-01-28 NOTE — Telephone Encounter (Signed)
Called pt, LVM.   CRM created.  

## 2019-01-28 NOTE — Telephone Encounter (Signed)
-----   Message from Biagio Borg, MD sent at 01/28/2019 11:42 AM EDT ----- shirron to contact pt -   U/s test is neg for DVT  We need to refer to Sports Medicine for the knee.  She should hear soon.  Thanks

## 2019-01-29 ENCOUNTER — Telehealth: Payer: Self-pay | Admitting: Internal Medicine

## 2019-01-29 NOTE — Telephone Encounter (Signed)
Patient is requesting a call back.  States pharmacy informed her to pick up Vit B3.   Gave pt lab results per Dr.John.

## 2019-02-02 NOTE — Telephone Encounter (Signed)
Called pt, LVM.   

## 2019-02-10 ENCOUNTER — Encounter: Payer: Self-pay | Admitting: Internal Medicine

## 2019-03-05 ENCOUNTER — Ambulatory Visit: Payer: Medicare Other | Admitting: Family Medicine

## 2019-03-05 NOTE — Progress Notes (Deleted)
Tawana ScaleZach Zell Doucette D.O. [facility] [address] [place], [place] [address] Phone: [phone] Subjective:    I'm seeing this patient by the request  of: Corwin LevinsJohn, James W, MD   CC: right knee pain   ION:[id number]:Subjective  Danielle Newman is a 85 y.o. 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; since 2008   CORONARY ARTERY DISEASE 02/11/2008   DEPRESSION 02/11/2008   DM (diabetes mellitus) (HCC) 11/21/2011   DYSPNEA 09/11/2010   GERD 02/11/2008   HYPERLIPIDEMIA 02/11/2008   HYPOTHYROIDISM 02/11/2008   Impaired glucose tolerance 11/20/2011   LUNG CANCER, HX OF 02/11/2008   MACULAR DEGENERATION 03/29/2009   OSTEOPENIA 02/11/2008   Sarcoidosis 02/11/2008   Sight impaired 04/05/2011   Past Surgical History:  Procedure Laterality Date   s/p arm surgury  05/29/1994   s/p left upper lobectomy     Social History   Socioeconomic History   Marital status: Widowed    Spouse name: Not on file   Number of children: Not on file   Years of education: Not on file   Highest education level: Not on file  Occupational History   Occupation: retired Engineer, manufacturing systemshair dresser   Social Needs   Financial resource strain: Not on file   Food insecurity    Worry: Not on file    Inability: Not on file   Transportation needs    Medical: Not on file    Non-medical: Not on file  Tobacco Use   Smoking status: Former Smoker    Packs/day: 2.00    Types: Cigarettes    Quit date: 05/29/1994    Years since quitting: 24.7   Smokeless tobacco: Never Used  Substance and Sexual Activity   Alcohol use: No   Drug use: No   Sexual activity: Not on file  Lifestyle   Physical activity    Days per week: Not on file    Minutes per session: Not on file   Stress: Not on file  Relationships   Social connections    Talks on phone: Not on file    Gets  together: Not on file    Attends religious service: Not on file    Active member of club or organization: Not on file    Attends meetings of clubs or organizations: Not on file    Relationship status: Not on file  Other Topics Concern   Not on file  Social History Narrative   Not on file   Allergies  Allergen Reactions   Fioricet [Butalbital-Apap-Caffeine] Other (See Comments)    confusion   Meperidine Hcl    Family History  Problem Relation Age of Onset   Cancer Mother        thyroid cancer   Allergies Daughter    Cancer Daughter        breast cancer    Current Outpatient Medications (Endocrine & Metabolic):    alendronate (FOSAMAX) 70 MG tablet, Take 1 tablet (70 mg total) by mouth every 7 (seven) days. Take with a full glass of water on an empty stomach.   levothyroxine (SYNTHROID) 100 MCG tablet, TAKE 1 TABLET (100 MCG TOTAL) BY MOUTH DAILY.  Current Outpatient Medications (Cardiovascular):    lovastatin (MEVACOR) 40 MG tablet, TAKE ONE TABLET BY MOUTH DAILY   Current Outpatient Medications (Analgesics):    aspirin  81 MG tablet, Take 81 mg by mouth daily.     butalbital-acetaminophen-caffeine (FIORICET) 50-325-40 MG per tablet, 1 tab by mouth per day as needed for migraine   Current Outpatient Medications (Other):    citalopram (CELEXA) 20 MG tablet, TAKE ONE TABLET BY MOUTH DAILY   diazepam (VALIUM) 10 MG tablet, Take 1 tablet (10 mg total) by mouth 2 (two) times daily as needed.   omeprazole (PRILOSEC) 20 MG capsule, Take 1 capsule (20 mg total) by mouth daily.   Vitamin D, Ergocalciferol, (DRISDOL) 1.25 MG (50000 UT) CAPS capsule, Take 1 capsule (50,000 Units total) by mouth every 7 (seven) days.    Past medical history, social, surgical and family history all reviewed in electronic medical record.  No pertanent information unless stated regarding to the chief complaint.   Review of Systems:  No headache, visual changes, nausea, vomiting,  diarrhea, constipation, dizziness, abdominal pain, skin rash, fevers, chills, night sweats, weight loss, swollen lymph nodes, body aches, joint swelling, muscle aches, chest pain, shortness of breath, mood changes.   Objective  There were no vitals taken for this visit. Systems examined below as of    General: No apparent distress alert and oriented x3 mood and affect normal, dressed appropriately.  HEENT: Pupils equal, extraocular movements intact  Respiratory: Patient's speak in full sentences and does not appear short of breath  Cardiovascular: No lower extremity edema, non tender, no erythema  Skin: Warm dry intact with no signs of infection or rash on extremities or on axial skeleton.  Abdomen: Soft nontender  Neuro: Cranial nerves II through XII are intact, neurovascularly intact in all extremities with 2+ DTRs and 2+ pulses.  Lymph: No lymphadenopathy of posterior or anterior cervical chain or axillae bilaterally.  Gait normal with good balance and coordination.  MSK:  Non tender with full range of motion and good stability and symmetric strength and tone of shoulders, elbows, wrist, hip, and ankles bilaterally.  Knee: valgus deformity noted. Large thigh to calf ratio.  Tender to palpation over medial and PF joint line.  ROM full in flexion and extension and lower leg rotation. instability with valgus force.  painful patellar compression. Patellar glide with moderate crepitus. Patellar and quadriceps tendons unremarkable. Hamstring and quadriceps strength is normal. Contralateral knee shows   Impression and Recommendations:     This case required medical decision making of moderate complexity. The above documentation has been reviewed and is accurate and complete Danielle Pulley, DO       Note: This dictation was prepared with Dragon dictation along with smaller phrase technology. Any transcriptional errors that result from this process are unintentional.

## 2019-03-26 ENCOUNTER — Ambulatory Visit: Payer: Medicare Other | Admitting: Family Medicine

## 2019-03-26 DIAGNOSIS — Z0289 Encounter for other administrative examinations: Secondary | ICD-10-CM

## 2019-04-02 ENCOUNTER — Other Ambulatory Visit: Payer: Self-pay | Admitting: Internal Medicine

## 2019-06-18 ENCOUNTER — Ambulatory Visit (INDEPENDENT_AMBULATORY_CARE_PROVIDER_SITE_OTHER): Payer: Medicare Other

## 2019-06-18 ENCOUNTER — Other Ambulatory Visit: Payer: Self-pay

## 2019-06-18 DIAGNOSIS — Z23 Encounter for immunization: Secondary | ICD-10-CM

## 2019-06-22 ENCOUNTER — Other Ambulatory Visit: Payer: Self-pay | Admitting: Internal Medicine

## 2019-06-30 ENCOUNTER — Telehealth: Payer: Self-pay | Admitting: *Deleted

## 2019-06-30 DIAGNOSIS — Z20822 Contact with and (suspected) exposure to covid-19: Secondary | ICD-10-CM

## 2019-06-30 DIAGNOSIS — Z20828 Contact with and (suspected) exposure to other viral communicable diseases: Secondary | ICD-10-CM

## 2019-06-30 NOTE — Telephone Encounter (Signed)
Tried calling pt/daughter there was no answer LMOM RTC...Danielle Newman

## 2019-06-30 NOTE — Telephone Encounter (Signed)
Ok this order is done  [facility]

## 2019-06-30 NOTE — Telephone Encounter (Signed)
Copied from [place] [id number]. 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 Patients daughter would like to speak with nurse in regards to taking patient to testing site today.

## 2019-07-01 ENCOUNTER — Telehealth: Payer: Self-pay

## 2019-07-01 ENCOUNTER — Telehealth: Payer: Self-pay | Admitting: *Deleted

## 2019-07-01 NOTE — Telephone Encounter (Signed)
A user error has taken place: {see previous msg../lmb

## 2019-07-01 NOTE — Telephone Encounter (Signed)
I spoke to pt's daughter in regards to this earlier this morning.

## 2019-07-01 NOTE — Telephone Encounter (Signed)
Pt's daughter has been informed of [employer] and hours.   Copied from [place] [id number]. 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 Patients daughter would like to speak with nurse in regards to taking patient to testing site today.

## 2019-07-03 ENCOUNTER — Ambulatory Visit: Payer: Medicare Other | Admitting: Internal Medicine

## 2019-07-11 ENCOUNTER — Other Ambulatory Visit: Payer: Self-pay | Admitting: Internal Medicine

## 2019-08-03 ENCOUNTER — Telehealth: Payer: Self-pay

## 2019-08-03 NOTE — Telephone Encounter (Signed)
Copied from CRM [id number]. Topic: General - Other >> Aug 03, 2019  2:06 PM Daphine Deutscher D wrote: Reason for CRM: pt has really bad constipation.  She had Covid around the end of Nov and had diarrhea.  When she can pass a stool it is very hard and has a little red blood on it.  CB#  [phone]   F/u   Virtual visit appt on 08/04/19 with Ria Clock

## 2019-08-04 ENCOUNTER — Ambulatory Visit (INDEPENDENT_AMBULATORY_CARE_PROVIDER_SITE_OTHER): Payer: Medicare Other | Admitting: Family

## 2019-08-04 DIAGNOSIS — K5909 Other constipation: Secondary | ICD-10-CM

## 2019-08-04 NOTE — Progress Notes (Signed)
Danielle Newman is a 86 y.o. female with the following history as recorded in [employer]:  Patient Active Problem List   Diagnosis Date Noted  . Pain and swelling of right lower leg 01/27/2019  . Bilateral hearing loss 05/17/2018  . CKD (chronic kidney disease) stage 3, GFR 30-59 ml/min 05/16/2018  . Cough 11/22/2017  . Acute hearing loss, right 05/17/2017  . Constipation 05/17/2017  . DM (diabetes mellitus) (HCC) 11/21/2011  . 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 Gait disorder 11/20/2011  . Leg weakness, bilateral 11/20/2011  . Migraine, unspecified, without mention of intractable migraine without mention of status migrainosus 04/05/2011  . Hearing loss, right 04/05/2011  . Sight impaired 04/05/2011  . Encounter for well adult exam with abnormal findings 04/01/2011  . Macular degeneration (senile) of retina 03/29/2009  . Sarcoidosis 02/11/2008  . Hypothyroidism 02/11/2008  . Hyperlipidemia 02/11/2008  . ANXIETY 02/11/2008  . Depression 02/11/2008  . CORONARY ARTERY DISEASE 02/11/2008  . ALLERGIC RHINITIS 02/11/2008  . GERD 02/11/2008  . OSTEOPENIA 02/11/2008  . LUNG CANCER, HX OF 02/11/2008    Current Outpatient Medications  Medication Sig Dispense Refill  . alendronate (FOSAMAX) 70 MG tablet Take 1 tablet (70 mg total) by mouth every 7 (seven) days. Take with a full glass of water on an empty stomach. 12 tablet 3  . aspirin 81 MG tablet Take 81 mg by mouth daily.      . butalbital-acetaminophen-caffeine (FIORICET) 50-325-40 MG per tablet 1 tab by mouth per day as needed for migraine 20 tablet 0  . citalopram (CELEXA) 20 MG tablet TAKE ONE TABLET BY MOUTH DAILY 90 tablet 1  . diazepam (VALIUM) 10 MG tablet Take 1 tablet (10 mg total) by mouth 2 (two) times daily as needed. 60 tablet 1  . levothyroxine (SYNTHROID) 100 MCG tablet TAKE ONE TABLET BY MOUTH DAILY 90 tablet 0  . lovastatin (MEVACOR) 40 MG tablet TAKE ONE TABLET BY MOUTH DAILY 90 tablet 0  . omeprazole (PRILOSEC) 20 MG  capsule Take 1 capsule (20 mg total) by mouth daily. 90 capsule 3  . Vitamin D, Ergocalciferol, (DRISDOL) 1.25 MG (50000 UT) CAPS capsule Take 1 capsule (50,000 Units total) by mouth every 7 (seven) days. 12 capsule 0   No current facility-administered medications for this visit.    Allergies: Fioricet [butalbital-apap-caffeine] and Meperidine hcl  Past Medical History:  Diagnosis Date  . ALLERGIC RHINITIS 02/11/2008  . ANXIETY 02/11/2008  . 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 CORONARY ARTERY DISEASE 02/11/2008  . DEPRESSION 02/11/2008  . DM (diabetes mellitus) (HCC) 11/21/2011  . DYSPNEA 09/11/2010  . GERD 02/11/2008  . HYPERLIPIDEMIA 02/11/2008  . HYPOTHYROIDISM 02/11/2008  . Impaired glucose tolerance 11/20/2011  . LUNG CANCER, HX OF 02/11/2008  . MACULAR DEGENERATION 03/29/2009  . OSTEOPENIA 02/11/2008  . Sarcoidosis 02/11/2008  . Sight impaired 04/05/2011    Past Surgical History:  Procedure Laterality Date  . s/p arm surgury  05/29/1994  . s/p left upper lobectomy      Family History  Problem Relation Age of Onset  . Cancer Mother        thyroid cancer  . Allergies Daughter   . Cancer Daughter        breast cancer    Social History   Tobacco Use  . Smoking status: Former Smoker    Packs/day: 2.00    Types: Cigarettes    Quit date: 05/29/1994    Years since quitting: 25.2  .  Smokeless tobacco: Never Used  Substance Use Topics  . Alcohol use: No    Subjective:    I connected with [name] on 08/04/19 at 11:00 AM EST by telephone call and verified that I am speaking with the correct person using two identifiers. Provider in office/ patient is at home; provider and patient are only 2 people on telephone call.     I discussed the limitations of evaluation and management by telemedicine and the availability of in person appointments. The patient expressed understanding and agreed to proceed.  Patient has been having increased problems with  constipation x 3 weeks. Notes that she was diagnosed with COVID at the end of November and had extensive diarrhea with COVID; since recovering from the virus, patient has had increased problems with constipation- having to use laxatives/ did see some episodes of bright red blood; notes that today, she actually has been able to have a normal bowel movement and did not see any further blood. Has started using Miralax again and wanted to make sure that it safe to do that.    Objective:  There were no vitals filed for this visit.  Lungs: Respirations unlabored;  Neurologic: Alert and oriented; speech intact;    1. Chronic constipation     Plan:  Per patient, symptoms are improving; she will continue to use Miralax daily and encouraged to continue fiber and increased water; she can also use OTC stool softener as needed. Follow up as needed otherwise.  Time spent 20 minutes  No follow-ups on file.  No orders of the defined types were placed in this encounter.   Requested Prescriptions    No prescriptions requested or ordered in this encounter

## 2019-09-25 ENCOUNTER — Other Ambulatory Visit: Payer: Self-pay | Admitting: Internal Medicine

## 2019-10-08 ENCOUNTER — Other Ambulatory Visit: Payer: Self-pay | Admitting: Internal Medicine

## 2019-10-09 ENCOUNTER — Encounter: Payer: Self-pay | Admitting: Internal Medicine

## 2019-10-09 ENCOUNTER — Other Ambulatory Visit: Payer: Self-pay

## 2019-10-09 ENCOUNTER — Ambulatory Visit (INDEPENDENT_AMBULATORY_CARE_PROVIDER_SITE_OTHER): Payer: Medicare Other | Admitting: Internal Medicine

## 2019-10-09 VITALS — BP 118/60 | HR 82 | Temp 98.0°F | Ht 67.0 in | Wt 196.8 lb

## 2019-10-09 DIAGNOSIS — E559 Vitamin D deficiency, unspecified: Secondary | ICD-10-CM | POA: Diagnosis not present

## 2019-10-09 DIAGNOSIS — M25561 Pain in right knee: Secondary | ICD-10-CM

## 2019-10-09 DIAGNOSIS — N183 Chronic kidney disease, stage 3 unspecified: Secondary | ICD-10-CM

## 2019-10-09 DIAGNOSIS — H9193 Unspecified hearing loss, bilateral: Secondary | ICD-10-CM | POA: Diagnosis not present

## 2019-10-09 DIAGNOSIS — E119 Type 2 diabetes mellitus without complications: Secondary | ICD-10-CM | POA: Diagnosis not present

## 2019-10-09 DIAGNOSIS — M25511 Pain in right shoulder: Secondary | ICD-10-CM

## 2019-10-09 DIAGNOSIS — E785 Hyperlipidemia, unspecified: Secondary | ICD-10-CM | POA: Diagnosis not present

## 2019-10-09 NOTE — Progress Notes (Signed)
Patient consent obtained. Irrigation with water and peroxide performed. Full view of bilateral tympanic membranes after procedure.  Patient tolerated procedure well.   

## 2019-10-09 NOTE — Patient Instructions (Signed)
Your ears were irrigated of wax today  Please continue all other medications as before, and refills have been done if requested.  Please have the pharmacy call with any other refills you may need.  Please continue your efforts at being more active, low cholesterol diet, and weight control.  You are otherwise up to date with prevention measures today.  Please keep your appointments with your specialists as you may have planned  You will be contacted regarding the referral for: Sports Medicine  Please go to the LAB at the blood drawing area for the tests to be done  You will be contacted by phone if any changes need to be made immediately.  Otherwise, you will receive a letter about your results with an explanation, but please check with MyChart first.  Please remember to sign up for MyChart if you have not done so, as this will be important to you in the future with finding out test results, communicating by private email, and scheduling acute appointments online when needed.  Please make an Appointment to return in 6 months, or sooner if needed

## 2019-10-09 NOTE — Progress Notes (Signed)
Subjective:    Patient ID: Danielle Newman, female    DOB: 03-30-1932, 86 y.o.   MRN: [id number]  HPI  Here to f/u; overall doing ok,  Pt denies chest pain, increasing sob or doe, wheezing, orthopnea, PND, increased LE swelling, palpitations, dizziness or syncope.  Pt denies new neurological symptoms such as new headache, or facial or extremity weakness or numbness.  Pt denies polydipsia, polyuria, or low sugar episode.  Pt states overall good compliance with meds, mostly trying to follow appropriate diet, and does have worsening 1 wk bilateral hearing loss, without pain, fever, d/c or tinnitus. Also with worsening acute on chronic right shoulder and knee pain but no falls or injury.  Past Medical History:  Diagnosis Date  . ALLERGIC RHINITIS 02/11/2008  . ANXIETY 02/11/2008  . 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 CORONARY ARTERY DISEASE 02/11/2008  . DEPRESSION 02/11/2008  . DM (diabetes mellitus) (HCC) 11/21/2011  . DYSPNEA 09/11/2010  . GERD 02/11/2008  . HYPERLIPIDEMIA 02/11/2008  . HYPOTHYROIDISM 02/11/2008  . Impaired glucose tolerance 11/20/2011  . LUNG CANCER, HX OF 02/11/2008  . MACULAR DEGENERATION 03/29/2009  . OSTEOPENIA 02/11/2008  . Sarcoidosis 02/11/2008  . Sight impaired 04/05/2011   Past Surgical History:  Procedure Laterality Date  . s/p arm surgury  05/29/1994  . s/p left upper lobectomy      reports that she quit smoking about 25 years ago. Her smoking use included cigarettes. She smoked 2.00 packs per day. She has never used smokeless tobacco. She reports that she does not drink alcohol or use drugs. family history includes Allergies in her daughter; Cancer in her daughter and mother. Allergies  Allergen Reactions  . Fioricet [Butalbital-Apap-Caffeine] Other (See Comments)    confusion  . Meperidine Hcl    Current Outpatient Medications on File Prior to Visit  Medication Sig Dispense Refill  . alendronate (FOSAMAX) 70 MG tablet Take 1 tablet (70 mg  total) by mouth every 7 (seven) days. Take with a full glass of water on an empty stomach. 12 tablet 3  . aspirin 81 MG tablet Take 81 mg by mouth daily.      . citalopram (CELEXA) 20 MG tablet TAKE ONE TABLET BY MOUTH DAILY 90 tablet 1  . diazepam (VALIUM) 10 MG tablet Take 1 tablet (10 mg total) by mouth 2 (two) times daily as needed. 60 tablet 1  . levothyroxine (SYNTHROID) 100 MCG tablet TAKE ONE TABLET BY MOUTH DAILY 90 tablet 1  . lovastatin (MEVACOR) 40 MG tablet TAKE ONE TABLET BY MOUTH DAILY 90 tablet 1  . omeprazole (PRILOSEC) 20 MG capsule Take 1 capsule (20 mg total) by mouth daily. 90 capsule 3  . butalbital-acetaminophen-caffeine (FIORICET) 50-325-40 MG per tablet 1 tab by mouth per day as needed for migraine (Patient not taking: Reported on 10/09/2019) 20 tablet 0  . Vitamin D, Ergocalciferol, (DRISDOL) 1.25 MG (50000 UT) CAPS capsule Take 1 capsule (50,000 Units total) by mouth every 7 (seven) days. (Patient not taking: Reported on 10/09/2019) 12 capsule 0   No current facility-administered medications on file prior to visit.   Review of Systems All otherwise neg per pt     Objective:   Physical Exam BP 118/60   Pulse 82   Temp 98 F (36.7 C)   Ht 5\' 7"  (1.702 m)   Wt 196 lb 12.8 oz (89.3 kg)   SpO2 99%   BMI 30.82 kg/m  VS noted,  Constitutional: Pt appears  in NAD HENT: Head: NCAT.  Right Ear: External ear normal.  Left Ear: External ear normal.  Eyes: . Pupils are equal, round, and reactive to light. Conjunctivae and EOM are normal Nose: without d/c or deformity Neck: Neck supple. Gross normal ROM Cardiovascular: Normal rate and regular rhythm.   Pulmonary/Chest: Effort normal and breath sounds without rales or wheezing.  Abd:  Soft, NT, ND, + BS, no organomegaly Neurological: Pt is alert. At baseline orientation, motor grossly intact Skin: Skin is warm. No rashes, other new lesions, no LE edema Psychiatric: Pt behavior is normal without agitation  All  otherwise neg per pt  Lab Results  Component Value Date   WBC 9.7 01/28/2019   HGB 14.4 01/28/2019   HCT 42.6 01/28/2019   PLT 283.0 01/28/2019   GLUCOSE 103 (H) 10/09/2019   CHOL 158 10/09/2019   TRIG 222 (H) 10/09/2019   HDL 37 (L) 10/09/2019   LDLDIRECT 100.0 05/16/2018   LDLCALC 90 10/09/2019   ALT 11 10/09/2019   AST 14 10/09/2019   NA 139 10/09/2019   K 4.3 10/09/2019   CL 102 10/09/2019   CREATININE 0.94 (H) 10/09/2019   BUN 20 10/09/2019   CO2 25 10/09/2019   TSH 1.65 01/28/2019   HGBA1C 5.6 10/09/2019   MICROALBUR 3.6 (H) 01/28/2019      Assessment & Plan:

## 2019-10-10 LAB — HEPATIC FUNCTION PANEL
AG Ratio: 1.1 (calc) (ref 1.0–2.5)
ALT: 11 U/L (ref 6–29)
AST: 14 U/L (ref 10–35)
Albumin: 3.9 g/dL (ref 3.6–5.1)
Alkaline phosphatase (APISO): 76 U/L (ref 37–153)
Bilirubin, Direct: 0.1 mg/dL (ref 0.0–0.2)
Globulin: 3.5 g/dL (calc) (ref 1.9–3.7)
Indirect Bilirubin: 0.3 mg/dL (calc) (ref 0.2–1.2)
Total Bilirubin: 0.4 mg/dL (ref 0.2–1.2)
Total Protein: 7.4 g/dL (ref 6.1–8.1)

## 2019-10-10 LAB — BASIC METABOLIC PANEL
BUN/Creatinine Ratio: 21 (calc) (ref 6–22)
BUN: 20 mg/dL (ref 7–25)
CO2: 25 mmol/L (ref 20–32)
Calcium: 9.5 mg/dL (ref 8.6–10.4)
Chloride: 102 mmol/L (ref 98–110)
Creat: 0.94 mg/dL — ABNORMAL HIGH (ref 0.60–0.88)
Glucose, Bld: 103 mg/dL — ABNORMAL HIGH (ref 65–99)
Potassium: 4.3 mmol/L (ref 3.5–5.3)
Sodium: 139 mmol/L (ref 135–146)

## 2019-10-10 LAB — LIPID PANEL
Cholesterol: 158 mg/dL (ref <200)
HDL: 37 mg/dL — ABNORMAL LOW (ref >=50)
LDL Cholesterol (Calc): 90 mg/dL (calc)
Non-HDL Cholesterol (Calc): 121 mg/dL (calc) (ref <130)
Total CHOL/HDL Ratio: 4.3 (calc) (ref <5.0)
Triglycerides: 222 mg/dL — ABNORMAL HIGH (ref <150)

## 2019-10-10 LAB — HEMOGLOBIN A1C
Hgb A1c MFr Bld: 5.6 % of total Hgb (ref <5.7)
Mean Plasma Glucose: 114 (calc)
eAG (mmol/L): 6.3 (calc)

## 2019-10-10 LAB — VITAMIN D 25 HYDROXY (VIT D DEFICIENCY, FRACTURES): Vit D, 25-Hydroxy: 27 ng/mL — ABNORMAL LOW (ref 30–100)

## 2019-10-11 ENCOUNTER — Encounter: Payer: Self-pay | Admitting: Internal Medicine

## 2019-10-11 DIAGNOSIS — M25561 Pain in right knee: Secondary | ICD-10-CM | POA: Insufficient documentation

## 2019-10-11 DIAGNOSIS — M25511 Pain in right shoulder: Secondary | ICD-10-CM | POA: Insufficient documentation

## 2019-10-11 NOTE — Assessment & Plan Note (Signed)
For oral replacement 

## 2019-10-11 NOTE — Assessment & Plan Note (Signed)
Etiology unclear, for sport med referral

## 2019-10-11 NOTE — Assessment & Plan Note (Signed)
stable overall by history and exam, recent data reviewed with pt, and pt to continue medical treatment as before,  to f/u any worsening symptoms or concerns  

## 2019-10-11 NOTE — Assessment & Plan Note (Signed)
Likely OA - for sport med referral

## 2019-10-11 NOTE — Assessment & Plan Note (Addendum)
Improved s/p irrigation bilateral wax impactions  I spent 41 minutes in preparing to see the patient by review of recent labs, imaging and procedures, obtaining and reviewing separately obtained history, communicating with the patient and family or caregiver, ordering medications, tests or procedures, and documenting clinical information in the EHR including the differential Dx, treatment, and any further evaluation and other management of bilateral hearing loss, DM, vit d deficiency, right shoulder and knee pain, HLD, CKD

## 2019-10-28 ENCOUNTER — Encounter: Payer: Self-pay | Admitting: Internal Medicine

## 2019-10-31 ENCOUNTER — Ambulatory Visit: Payer: Medicare Other

## 2019-12-28 ENCOUNTER — Other Ambulatory Visit: Payer: Self-pay | Admitting: Internal Medicine

## 2019-12-28 NOTE — Telephone Encounter (Signed)
Done  1 mo only  Please ask pt to make ROV for further refills

## 2020-03-25 ENCOUNTER — Ambulatory Visit: Payer: Medicare Other | Admitting: Internal Medicine

## 2020-03-25 ENCOUNTER — Other Ambulatory Visit: Payer: Self-pay | Admitting: Internal Medicine

## 2020-04-01 ENCOUNTER — Other Ambulatory Visit: Payer: Self-pay | Admitting: Internal Medicine

## 2020-04-01 NOTE — Telephone Encounter (Signed)
Please refill as per office routine med refill policy (all routine meds refilled for 3 mo or monthly per pt preference up to one year from last visit, then month to month grace period for 3 mo, then further med refills will have to be denied)  

## 2020-05-20 ENCOUNTER — Other Ambulatory Visit: Payer: Self-pay

## 2020-05-20 ENCOUNTER — Ambulatory Visit (INDEPENDENT_AMBULATORY_CARE_PROVIDER_SITE_OTHER): Payer: Medicare Other | Admitting: Internal Medicine

## 2020-05-20 ENCOUNTER — Encounter: Payer: Self-pay | Admitting: Internal Medicine

## 2020-05-20 VITALS — BP 140/86 | HR 89 | Temp 98.1°F | Ht 67.0 in | Wt 188.5 lb

## 2020-05-20 DIAGNOSIS — E1165 Type 2 diabetes mellitus with hyperglycemia: Secondary | ICD-10-CM

## 2020-05-20 DIAGNOSIS — H9193 Unspecified hearing loss, bilateral: Secondary | ICD-10-CM

## 2020-05-20 DIAGNOSIS — Z23 Encounter for immunization: Secondary | ICD-10-CM | POA: Diagnosis not present

## 2020-05-20 DIAGNOSIS — H579 Unspecified disorder of eye and adnexa: Secondary | ICD-10-CM | POA: Insufficient documentation

## 2020-05-20 DIAGNOSIS — N183 Chronic kidney disease, stage 3 unspecified: Secondary | ICD-10-CM | POA: Diagnosis not present

## 2020-05-20 DIAGNOSIS — E785 Hyperlipidemia, unspecified: Secondary | ICD-10-CM

## 2020-05-20 NOTE — Patient Instructions (Addendum)
You had the flu shot today  Please continue all other medications as before, and refills have been done if requested.  Please have the pharmacy call with any other refills you may need.  Please continue your efforts at being more active, low cholesterol diet, and weight control.  You are otherwise up to date with prevention measures today.  Please keep your appointments with your specialists as you may have planned   Please return about 9 am Monday for the ears to be irrigated of wax, and lab testing   You will be contacted by phone if any changes need to be made immediately.  Otherwise, you will receive a letter about your results with an explanation, but please check with MyChart first.  Please remember to sign up for MyChart if you have not done so, as this will be important to you in the future with finding out test results, communicating by private email, and scheduling acute appointments online when needed.  Please make an Appointment to return in 6 months, or sooner if needed

## 2020-05-20 NOTE — Progress Notes (Signed)
Subjective:    Patient ID: Danielle Newman, female    DOB: March 29, 1932, 86 y.o.   MRN: [id number]  HPI  Here to f/u; overall doing ok,  Pt denies chest pain, increasing sob or doe, wheezing, orthopnea, PND, increased LE swelling, palpitations, dizziness or syncope.  Pt denies new neurological symptoms such as new headache, or facial or extremity weakness or numbness.  Pt denies polydipsia, polyuria, or low sugar episode.  Pt states overall good compliance with meds, mostly trying to follow appropriate diet, with wt overall stable,  but little exercise however.  S/p covid infection nov 2020, then 2-3 mo GI upset and diarrhea, no hospn.  S/p vaccination 90 days after.  Due for flu shot.  Also has bilateral hearing loss without HA, pain, fever or d/c, needs irrigations both sides but wants to come back next Monday for this.  Also has several days of a right eye foreign body sensation without other pain, swelling, fever, d/c or vision change.   Past Medical History:  Diagnosis Date  . ALLERGIC RHINITIS 02/11/2008  . ANXIETY 02/11/2008  . 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 CORONARY ARTERY DISEASE 02/11/2008  . DEPRESSION 02/11/2008  . DM (diabetes mellitus) (HCC) 11/21/2011  . DYSPNEA 09/11/2010  . GERD 02/11/2008  . HYPERLIPIDEMIA 02/11/2008  . HYPOTHYROIDISM 02/11/2008  . Impaired glucose tolerance 11/20/2011  . LUNG CANCER, HX OF 02/11/2008  . MACULAR DEGENERATION 03/29/2009  . OSTEOPENIA 02/11/2008  . Sarcoidosis 02/11/2008  . Sight impaired 04/05/2011   Past Surgical History:  Procedure Laterality Date  . s/p arm surgury  05/29/1994  . s/p left upper lobectomy      reports that she quit smoking about 25 years ago. Her smoking use included cigarettes. She smoked 2.00 packs per day. She has never used smokeless tobacco. She reports that she does not drink alcohol and does not use drugs. family history includes Allergies in her daughter; Cancer in her daughter and mother. Allergies    Allergen Reactions  . Fioricet [Butalbital-Apap-Caffeine] Other (See Comments)    confusion  . Meperidine Hcl    Current Outpatient Medications on File Prior to Visit  Medication Sig Dispense Refill  . alendronate (FOSAMAX) 70 MG tablet Take 1 tablet (70 mg total) by mouth every 7 (seven) days. Take with a full glass of water on an empty stomach. 12 tablet 3  . aspirin 81 MG tablet Take 81 mg by mouth daily.      . citalopram (CELEXA) 20 MG tablet TAKE ONE TABLET BY MOUTH DAILY 30 tablet 0  . diazepam (VALIUM) 10 MG tablet Take 1 tablet (10 mg total) by mouth 2 (two) times daily as needed. 60 tablet 1  . levothyroxine (SYNTHROID) 100 MCG tablet TAKE ONE TABLET BY MOUTH DAILY 90 tablet 1  . lovastatin (MEVACOR) 40 MG tablet TAKE ONE TABLET BY MOUTH DAILY 90 tablet 1  . omeprazole (PRILOSEC) 20 MG capsule Take 1 capsule (20 mg total) by mouth daily. 90 capsule 3   No current facility-administered medications on file prior to visit.   Review of Systems All otherwise neg per pt    Objective:   Physical Exam BP 140/86 (BP Location: Left Arm, Patient Position: Sitting, Cuff Size: Large)   Pulse 89   Temp 98.1 F (36.7 C) (Oral)   Ht 5\' 7"  (1.702 m)   Wt 188 lb 8 oz (85.5 kg)   SpO2 92%   BMI 29.52 kg/m   VS  noted,  Constitutional: Pt appears in NAD HENT: Head: NCAT.  Right Ear: External ear normal.  Left Ear: External ear normal.  Has bilateral wax impactions Eyes: . Pupils are equal, round, and reactive to light. Conjunctivae and EOM are normal.  No foregin body noted to right conjunctiva.  Nose: without d/c or deformity Neck: Neck supple. Gross normal ROM Cardiovascular: Normal rate and regular rhythm.   Pulmonary/Chest: Effort normal and breath sounds without rales or wheezing.  Abd:  Soft, NT, ND, + BS, no organomegaly Neurological: Pt is alert. At baseline orientation, motor grossly intact Skin: Skin is warm. No rashes, other new lesions, no LE edema Psychiatric: Pt  behavior is normal without agitation  All otherwise neg per pt Lab Results  Component Value Date   WBC 9.7 01/28/2019   HGB 14.4 01/28/2019   HCT 42.6 01/28/2019   PLT 283.0 01/28/2019   GLUCOSE 103 (H) 10/09/2019   CHOL 158 10/09/2019   TRIG 222 (H) 10/09/2019   HDL 37 (L) 10/09/2019   LDLDIRECT 100.0 05/16/2018   LDLCALC 90 10/09/2019   ALT 11 10/09/2019   AST 14 10/09/2019   NA 139 10/09/2019   K 4.3 10/09/2019   CL 102 10/09/2019   CREATININE 0.94 (H) 10/09/2019   BUN 20 10/09/2019   CO2 25 10/09/2019   TSH 1.65 01/28/2019   HGBA1C 5.6 10/09/2019   MICROALBUR 3.6 (H) 01/28/2019       Assessment & Plan:

## 2020-05-21 ENCOUNTER — Encounter: Payer: Self-pay | Admitting: Internal Medicine

## 2020-05-21 NOTE — Assessment & Plan Note (Signed)
stable overall by history and exam, recent data reviewed with pt, and pt to continue medical treatment as before,  to f/u any worsening symptoms or concerns  

## 2020-05-21 NOTE — Assessment & Plan Note (Signed)
Pt to return Monday for wax impaction irrigations

## 2020-05-21 NOTE — Assessment & Plan Note (Addendum)
Ok for referral optho, exam benign  I spent 31 minutes in preparing to see the patient by review of recent labs, imaging and procedures, obtaining and reviewing separately obtained history, communicating with the patient and family or caregiver, ordering medications, tests or procedures, and documenting clinical information in the EHR including the differential Dx, treatment, and any further evaluation and other management of foreign body right eye, ckd, dm, hld, bilateral hearing loss,

## 2020-06-07 DIAGNOSIS — H16141 Punctate keratitis, right eye: Secondary | ICD-10-CM | POA: Diagnosis not present

## 2020-06-07 LAB — HM DIABETES EYE EXAM

## 2020-06-20 DIAGNOSIS — H16141 Punctate keratitis, right eye: Secondary | ICD-10-CM | POA: Diagnosis not present

## 2020-06-27 ENCOUNTER — Encounter: Payer: Self-pay | Admitting: Internal Medicine

## 2020-07-11 ENCOUNTER — Other Ambulatory Visit: Payer: Self-pay | Admitting: Internal Medicine

## 2020-07-18 ENCOUNTER — Encounter (INDEPENDENT_AMBULATORY_CARE_PROVIDER_SITE_OTHER): Payer: Self-pay

## 2020-07-18 DIAGNOSIS — H16141 Punctate keratitis, right eye: Secondary | ICD-10-CM | POA: Diagnosis not present

## 2020-09-22 ENCOUNTER — Other Ambulatory Visit: Payer: Self-pay | Admitting: Internal Medicine

## 2020-09-22 NOTE — Telephone Encounter (Signed)
Please refill as per office routine med refill policy (all routine meds refilled for 3 mo or monthly per pt preference up to one year from last visit, then month to month grace period for 3 mo, then further med refills will have to be denied)  

## 2020-09-26 ENCOUNTER — Telehealth: Payer: Self-pay | Admitting: Internal Medicine

## 2020-09-26 NOTE — Telephone Encounter (Signed)
Patient woke up and has a lump in her neck.  No pain or discomfort  [facility] has advised for patient to see PCP within 24 hours. Patient understood

## 2020-09-27 ENCOUNTER — Encounter: Payer: Self-pay | Admitting: Internal Medicine

## 2020-09-27 ENCOUNTER — Ambulatory Visit (INDEPENDENT_AMBULATORY_CARE_PROVIDER_SITE_OTHER): Payer: Medicare Other | Admitting: Internal Medicine

## 2020-09-27 ENCOUNTER — Other Ambulatory Visit: Payer: Self-pay

## 2020-09-27 VITALS — BP 136/80 | HR 78 | Ht 67.0 in | Wt 187.0 lb

## 2020-09-27 DIAGNOSIS — R220 Localized swelling, mass and lump, head: Secondary | ICD-10-CM

## 2020-09-27 DIAGNOSIS — E559 Vitamin D deficiency, unspecified: Secondary | ICD-10-CM | POA: Diagnosis not present

## 2020-09-27 DIAGNOSIS — E1165 Type 2 diabetes mellitus with hyperglycemia: Secondary | ICD-10-CM

## 2020-09-27 DIAGNOSIS — N183 Chronic kidney disease, stage 3 unspecified: Secondary | ICD-10-CM | POA: Diagnosis not present

## 2020-09-27 DIAGNOSIS — Z0001 Encounter for general adult medical examination with abnormal findings: Secondary | ICD-10-CM

## 2020-09-27 DIAGNOSIS — E538 Deficiency of other specified B group vitamins: Secondary | ICD-10-CM | POA: Diagnosis not present

## 2020-09-27 DIAGNOSIS — E7800 Pure hypercholesterolemia, unspecified: Secondary | ICD-10-CM | POA: Diagnosis not present

## 2020-09-27 DIAGNOSIS — E039 Hypothyroidism, unspecified: Secondary | ICD-10-CM | POA: Diagnosis not present

## 2020-09-27 DIAGNOSIS — H9191 Unspecified hearing loss, right ear: Secondary | ICD-10-CM | POA: Diagnosis not present

## 2020-09-27 DIAGNOSIS — H6121 Impacted cerumen, right ear: Secondary | ICD-10-CM | POA: Diagnosis not present

## 2020-09-27 MED ORDER — DOXYCYCLINE HYCLATE 100 MG PO TABS: 100.0000 mg | ORAL_TABLET | Freq: Two times a day (BID) | ORAL | 0 refills | Status: DC

## 2020-09-27 NOTE — Patient Instructions (Signed)
Please take all new medication as prescribed - the antibiotic  Please continue all other medications as before, and refills have been done if requested.  Please have the pharmacy call with any other refills you may need.  Please continue your efforts at being more active, low cholesterol diet, and weight control.  You are otherwise up to date with prevention measures today.  Please keep your appointments with your specialists as you may have planned  You will be contacted regarding the referral for: ENT  Please go to the LAB at the blood drawing area for the tests to be done - at the [facility] lab site  You will be contacted by phone if any changes need to be made immediately.  Otherwise, you will receive a letter about your results with an explanation, but please check with MyChart first.  Please remember to sign up for MyChart if you have not done so, as this will be important to you in the future with finding out test results, communicating by private email, and scheduling acute appointments online when needed.  Please make an Appointment to return in 6 months, or sooner if needed

## 2020-09-27 NOTE — Progress Notes (Signed)
Patient ID: Danielle Newman, female   DOB: 03-06-1932, 87 y.o.   MRN: [id number]         Chief Complaint:: wellness exam and lump to right neck, decreased hearing right ear, and bilateral knee pain       HPI:  Danielle Newman is a 87 y.o. female here for wellness exam; declines Tdap, ow up to date with immunizations and preventive referrals.                        Also c/o new onset 2 wks firm rather large lump to the right submandibular area with mild discomfort but no fever, chills, ST, cough, or difficulty with swallow, breathing.  just sits there, nothing seems to make better or worse.  No other new lumps she is aware to the head and neck or otherwise.  Has ongoing right hearing loss she is sujectively aware is worsening in the past year as well without pain, HA, tinnitus, vertigo or other sinus symptoms worsening.   Also has persistent now worsening bilateral knee pain, mod, intermitent, 3-6 mo, assoc with swelling at times, but no giveaways or falls but has to hold on to things sometimes.  Has cane.  Never did get to see sport med after last visit for the knees.   Wt Readings from Last 3 Encounters:  09/27/20 187 lb (84.8 kg)  05/20/20 188 lb 8 oz (85.5 kg)  10/09/19 196 lb 12.8 oz (89.3 kg)   BP Readings from Last 3 Encounters:  09/27/20 136/80  05/20/20 140/86  10/09/19 118/60   Immunization History  Administered Date(s) Administered  . Fluad Quad(high Dose 65+) 06/18/2019, 05/20/2020  . Influenza Whole 09/11/2010, 04/05/2011  . Influenza, High Dose Seasonal PF 05/17/2017, 05/16/2018  . Influenza, Seasonal, Injecte, Preservative Fre 08/14/2012  . Influenza,inj,Quad PF,6+ Mos 04/23/2013, 05/12/2014, 05/31/2015  . PFIZER(Purple Top)SARS-COV-2 Vaccination 11/23/2019, 12/14/2019  . Pneumococcal Conjugate-13 04/23/2013  . Pneumococcal Polysaccharide-23 01/01/2006, 09/11/2010  . Td 09/11/2010  . Zoster 01/01/2006   Health Maintenance Due  Topic Date Due  . URINE MICROALBUMIN   01/28/2020  . HEMOGLOBIN A1C  04/10/2020      Past Medical History:  Diagnosis Date  . ALLERGIC RHINITIS 02/11/2008  . ANXIETY 02/11/2008  . 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 CORONARY ARTERY DISEASE 02/11/2008  . DEPRESSION 02/11/2008  . DM (diabetes mellitus) (HCC) 11/21/2011  . DYSPNEA 09/11/2010  . GERD 02/11/2008  . HYPERLIPIDEMIA 02/11/2008  . HYPOTHYROIDISM 02/11/2008  . Impaired glucose tolerance 11/20/2011  . LUNG CANCER, HX OF 02/11/2008  . MACULAR DEGENERATION 03/29/2009  . OSTEOPENIA 02/11/2008  . Sarcoidosis 02/11/2008  . Sight impaired 04/05/2011   Past Surgical History:  Procedure Laterality Date  . s/p arm surgury  05/29/1994  . s/p left upper lobectomy      reports that she quit smoking about 26 years ago. Her smoking use included cigarettes. She smoked 2.00 packs per day. She has never used smokeless tobacco. She reports that she does not drink alcohol and does not use drugs. family history includes Allergies in her daughter; Cancer in her daughter and mother. Allergies  Allergen Reactions  . Fioricet [Butalbital-Apap-Caffeine] Other (See Comments)    confusion  . Meperidine Hcl    Current Outpatient Medications on File Prior to Visit  Medication Sig Dispense Refill  . alendronate (FOSAMAX) 70 MG tablet Take 1 tablet (70 mg total) by mouth every 7 (seven) days. Take with a  full glass of water on an empty stomach. 12 tablet 3  . aspirin 81 MG tablet Take 81 mg by mouth daily.    . citalopram (CELEXA) 20 MG tablet TAKE ONE TABLET BY MOUTH DAILY 90 tablet 2  . diazepam (VALIUM) 10 MG tablet Take 1 tablet (10 mg total) by mouth 2 (two) times daily as needed. 60 tablet 1  . levothyroxine (SYNTHROID) 100 MCG tablet TAKE ONE TABLET BY MOUTH DAILY 90 tablet 1  . lovastatin (MEVACOR) 40 MG tablet TAKE ONE TABLET BY MOUTH DAILY 90 tablet 1  . omeprazole (PRILOSEC) 20 MG capsule Take 1 capsule (20 mg total) by mouth daily. 90 capsule 3  . neomycin-polymyxin  b-dexamethasone (MAXITROL) 3.5-10000-0.1 OINT     . timolol (TIMOPTIC) 0.5 % ophthalmic solution SMARTSIG:In Eye(s)     No current facility-administered medications on file prior to visit.        ROS:  All others reviewed and negative.  Objective        PE:  BP 136/80   Pulse 78   Ht 5\' 7"  (1.702 m)   Wt 187 lb (84.8 kg)   SpO2 98%   BMI 29.29 kg/m                 Constitutional: Pt appears in NAD               HENT: Head: NCAT.                Right Ear: External ear normal.                 Left Ear: External ear normal.                Eyes: . Pupils are equal, round, and reactive to light. Conjunctivae and EOM are normal               Nose: without d/c or deformity               Neck: Neck supple. Gross normal ROM, has large 2 cm or more right submandibular firm tender mass rather fixed it seems to me without other associated LA to head and neck               Cardiovascular: Normal rate and regular rhythm.                 Pulmonary/Chest: Effort normal and breath sounds without rales or wheezing.                Abd:  Soft, NT, ND, + BS, no organomegaly               biateral knees with mod to severe degenerative changes and small effusions               Neurological: Pt is alert. At baseline orientation, motor grossly intact               Skin: Skin is warm. No rashes, no other new lesions, LE edema - none               Psychiatric: Pt behavior is normal without agitation   Micro: none  Cardiac tracings I have personally interpreted today:  none  Pertinent Radiological findings (summarize): none   Lab Results  Component Value Date   WBC 9.7 01/28/2019   HGB 14.4 01/28/2019   HCT 42.6 01/28/2019   PLT 283.0 01/28/2019   GLUCOSE 103 (H) 10/09/2019  CHOL 158 10/09/2019   TRIG 222 (H) 10/09/2019   HDL 37 (L) 10/09/2019   LDLDIRECT 100.0 05/16/2018   LDLCALC 90 10/09/2019   ALT 11 10/09/2019   AST 14 10/09/2019   NA 139 10/09/2019   K 4.3 10/09/2019   CL 102  10/09/2019   CREATININE 0.94 (H) 10/09/2019   BUN 20 10/09/2019   CO2 25 10/09/2019   TSH 1.65 01/28/2019   HGBA1C 5.6 10/09/2019   MICROALBUR 3.6 (H) 01/28/2019   Assessment/Plan:  JAYLIE NEAVES is a 87 y.o. White or Caucasian [1] female with  has a past medical history of ALLERGIC RHINITIS (02/11/2008), ANXIETY (02/11/2008), Blind right eye (11/20/2011), CORONARY ARTERY DISEASE (02/11/2008), DEPRESSION (02/11/2008), DM (diabetes mellitus) (HCC) (11/21/2011), DYSPNEA (09/11/2010), GERD (02/11/2008), HYPERLIPIDEMIA (02/11/2008), HYPOTHYROIDISM (02/11/2008), Impaired glucose tolerance (11/20/2011), LUNG CANCER, HX OF (02/11/2008), MACULAR DEGENERATION (03/29/2009), OSTEOPENIA (02/11/2008), Sarcoidosis (02/11/2008), and Sight impaired (04/05/2011).  Encounter for well adult exam with abnormal findings Age and sex appropriate education and counseling updated with regular exercise and diet Referrals for preventative services - none needed Immunizations addressed - none needed- declines tdap Smoking counseling  - none needed Evidence for depression or other mood disorder - none significant Most recent labs reviewed. I have personally reviewed and have noted: 1) the patient's medical and social history 2) The patient's current medications and supplements 3) The patient's height, weight, and BMI have been recorded in the chart   Acute hearing loss, right Acute on chronic , exam benign, Worsening recently, for ENT referral  CKD (chronic kidney disease) stage 3, GFR 30-59 ml/min Lab Results  Component Value Date   CREATININE 0.94 (H) 10/09/2019   Stable overall, cont to avoid nephrotoxins  DM (diabetes mellitus) Lab Results  Component Value Date   HGBA1C 5.6 10/09/2019   Stable, pt to continue current medical treatment- diet and wt control  Current Outpatient Medications (Endocrine & Metabolic):  .  alendronate (FOSAMAX) 70 MG tablet, Take 1 tablet (70 mg total) by mouth every 7 (seven) days. Take  with a full glass of water on an empty stomach. .  levothyroxine (SYNTHROID) 100 MCG tablet, TAKE ONE TABLET BY MOUTH DAILY  Current Outpatient Medications (Cardiovascular):  .  lovastatin (MEVACOR) 40 MG tablet, TAKE ONE TABLET BY MOUTH DAILY   Current Outpatient Medications (Analgesics):  .  aspirin 81 MG tablet, Take 81 mg by mouth daily.   Current Outpatient Medications (Other):  .  citalopram (CELEXA) 20 MG tablet, TAKE ONE TABLET BY MOUTH DAILY .  diazepam (VALIUM) 10 MG tablet, Take 1 tablet (10 mg total) by mouth 2 (two) times daily as needed. .  doxycycline (VIBRA-TABS) 100 MG tablet, Take 1 tablet (100 mg total) by mouth 2 (two) times daily. Marland Kitchen  omeprazole (PRILOSEC) 20 MG capsule, Take 1 capsule (20 mg total) by mouth daily. Marland Kitchen  neomycin-polymyxin b-dexamethasone (MAXITROL) 3.5-10000-0.1 OINT,  .  timolol (TIMOPTIC) 0.5 % ophthalmic solution, SMARTSIG:In Eye(s)   Hyperlipidemia Lab Results  Component Value Date   LDLCALC 90 10/09/2019   Stable, pt to continue current statin lovastatin   Mass of right submandibular region Etiology unclear, for trial antbx, refer ENT  Vitamin D deficiency Last vitamin D Lab Results  Component Value Date   VD25OH 27 (L) 10/09/2019   Low, start oral replacement   Hypothyroidism Lab Results  Component Value Date   TSH 1.65 01/28/2019   Stable, pt to continue levothyroxine   Followup: Return in about 6 months (around 03/30/2021).  Oliver Barre,  MD 10/03/2020 9:31 AM [facility]

## 2020-09-28 ENCOUNTER — Other Ambulatory Visit: Payer: Self-pay

## 2020-09-30 ENCOUNTER — Ambulatory Visit: Payer: Medicare Other | Admitting: Internal Medicine

## 2020-10-03 ENCOUNTER — Encounter: Payer: Self-pay | Admitting: Internal Medicine

## 2020-10-03 NOTE — Assessment & Plan Note (Signed)
Lab Results  Component Value Date   LDLCALC 90 10/09/2019   Stable, pt to continue current statin lovastatin

## 2020-10-03 NOTE — Assessment & Plan Note (Signed)
Lab Results  Component Value Date   CREATININE 0.94 (H) 10/09/2019   Stable overall, cont to avoid nephrotoxins

## 2020-10-03 NOTE — Assessment & Plan Note (Signed)
Last vitamin D Lab Results  Component Value Date   VD25OH 27 (L) 10/09/2019   Low, start oral replacement

## 2020-10-03 NOTE — Assessment & Plan Note (Signed)
Age and sex appropriate education and counseling updated with regular exercise and diet Referrals for preventative services - none needed Immunizations addressed - none needed - declines tdap Smoking counseling  - none needed Evidence for depression or other mood disorder - none significant Most recent labs reviewed. I have personally reviewed and have noted: 1) the patient's medical and social history 2) The patient's current medications and supplements 3) The patient's height, weight, and BMI have been recorded in the chart  

## 2020-10-03 NOTE — Assessment & Plan Note (Signed)
Lab Results  Component Value Date   TSH 1.65 01/28/2019   Stable, pt to continue levothyroxine

## 2020-10-03 NOTE — Assessment & Plan Note (Signed)
Etiology unclear, for trial antbx, refer ENT

## 2020-10-03 NOTE — Assessment & Plan Note (Addendum)
Acute on chronic , exam benign, Worsening recently, for ENT referral

## 2020-10-03 NOTE — Assessment & Plan Note (Signed)
Lab Results  Component Value Date   HGBA1C 5.6 10/09/2019   Stable, pt to continue current medical treatment- diet and wt control  Current Outpatient Medications (Endocrine & Metabolic):  .  alendronate (FOSAMAX) 70 MG tablet, Take 1 tablet (70 mg total) by mouth every 7 (seven) days. Take with a full glass of water on an empty stomach. .  levothyroxine (SYNTHROID) 100 MCG tablet, TAKE ONE TABLET BY MOUTH DAILY  Current Outpatient Medications (Cardiovascular):  .  lovastatin (MEVACOR) 40 MG tablet, TAKE ONE TABLET BY MOUTH DAILY   Current Outpatient Medications (Analgesics):  .  aspirin 81 MG tablet, Take 81 mg by mouth daily.   Current Outpatient Medications (Other):  .  citalopram (CELEXA) 20 MG tablet, TAKE ONE TABLET BY MOUTH DAILY .  diazepam (VALIUM) 10 MG tablet, Take 1 tablet (10 mg total) by mouth 2 (two) times daily as needed. .  doxycycline (VIBRA-TABS) 100 MG tablet, Take 1 tablet (100 mg total) by mouth 2 (two) times daily. Marland Kitchen  omeprazole (PRILOSEC) 20 MG capsule, Take 1 capsule (20 mg total) by mouth daily. Marland Kitchen  neomycin-polymyxin b-dexamethasone (MAXITROL) 3.5-10000-0.1 OINT,  .  timolol (TIMOPTIC) 0.5 % ophthalmic solution, SMARTSIG:In Eye(s)

## 2020-10-12 ENCOUNTER — Other Ambulatory Visit: Payer: Self-pay | Admitting: Internal Medicine

## 2020-10-31 ENCOUNTER — Other Ambulatory Visit (INDEPENDENT_AMBULATORY_CARE_PROVIDER_SITE_OTHER): Payer: Medicare Other

## 2020-10-31 DIAGNOSIS — E1165 Type 2 diabetes mellitus with hyperglycemia: Secondary | ICD-10-CM | POA: Diagnosis not present

## 2020-10-31 DIAGNOSIS — N183 Chronic kidney disease, stage 3 unspecified: Secondary | ICD-10-CM | POA: Diagnosis not present

## 2020-10-31 DIAGNOSIS — E538 Deficiency of other specified B group vitamins: Secondary | ICD-10-CM

## 2020-10-31 DIAGNOSIS — H6121 Impacted cerumen, right ear: Secondary | ICD-10-CM | POA: Diagnosis not present

## 2020-10-31 LAB — CBC WITH DIFFERENTIAL/PLATELET
Basophils Absolute: 0.1 10*3/uL (ref 0.0–0.1)
Basophils Relative: 0.9 % (ref 0.0–3.0)
Eosinophils Absolute: 0.3 10*3/uL (ref 0.0–0.7)
Eosinophils Relative: 2.8 % (ref 0.0–5.0)
HCT: 40.9 % (ref 36.0–46.0)
Hemoglobin: 13.8 g/dL (ref 12.0–15.0)
Lymphocytes Relative: 23.1 % (ref 12.0–46.0)
Lymphs Abs: 2.2 10*3/uL (ref 0.7–4.0)
MCHC: 33.8 g/dL (ref 30.0–36.0)
MCV: 88.3 fl (ref 78.0–100.0)
Monocytes Absolute: 0.7 10*3/uL (ref 0.1–1.0)
Monocytes Relative: 6.9 % (ref 3.0–12.0)
Neutro Abs: 6.3 10*3/uL (ref 1.4–7.7)
Neutrophils Relative %: 66.3 % (ref 43.0–77.0)
Platelets: 274 10*3/uL (ref 150.0–400.0)
RBC: 4.63 Mil/uL (ref 3.87–5.11)
RDW: 14.2 % (ref 11.5–15.5)
WBC: 9.5 10*3/uL (ref 4.0–10.5)

## 2020-10-31 LAB — HEPATIC FUNCTION PANEL
ALT: 12 U/L (ref 0–35)
AST: 14 U/L (ref 0–37)
Albumin: 4.1 g/dL (ref 3.5–5.2)
Alkaline Phosphatase: 74 U/L (ref 39–117)
Bilirubin, Direct: 0.1 mg/dL (ref 0.0–0.3)
Total Bilirubin: 0.5 mg/dL (ref 0.2–1.2)
Total Protein: 7.8 g/dL (ref 6.0–8.3)

## 2020-10-31 LAB — PHOSPHORUS: Phosphorus: 3.4 mg/dL (ref 2.3–4.6)

## 2020-10-31 LAB — HEMOGLOBIN A1C: Hgb A1c MFr Bld: 5.6 % (ref 4.6–6.5)

## 2020-10-31 LAB — LIPID PANEL
Cholesterol: 148 mg/dL (ref 0–200)
HDL: 36.2 mg/dL — ABNORMAL LOW (ref >39.00)
LDL Cholesterol: 73 mg/dL (ref 0–99)
NonHDL: 111.59
Total CHOL/HDL Ratio: 4
Triglycerides: 192 mg/dL — ABNORMAL HIGH (ref 0.0–149.0)
VLDL: 38.4 mg/dL (ref 0.0–40.0)

## 2020-10-31 LAB — BASIC METABOLIC PANEL
BUN: 18 mg/dL (ref 6–23)
CO2: 28 mEq/L (ref 19–32)
Calcium: 9.3 mg/dL (ref 8.4–10.5)
Chloride: 103 mEq/L (ref 96–112)
Creatinine, Ser: 0.88 mg/dL (ref 0.40–1.20)
GFR: 59.38 mL/min — ABNORMAL LOW (ref >60.00)
Glucose, Bld: 86 mg/dL (ref 70–99)
Potassium: 4.4 mEq/L (ref 3.5–5.1)
Sodium: 139 mEq/L (ref 135–145)

## 2020-10-31 LAB — VITAMIN D 25 HYDROXY (VIT D DEFICIENCY, FRACTURES): VITD: 32.88 ng/mL (ref 30.00–100.00)

## 2020-10-31 LAB — VITAMIN B12: Vitamin B-12: 280 pg/mL (ref 211–911)

## 2020-10-31 LAB — TSH: TSH: 1.17 u[IU]/mL (ref 0.35–4.50)

## 2020-11-02 ENCOUNTER — Encounter: Payer: Self-pay | Admitting: Internal Medicine

## 2020-11-03 LAB — PTH, INTACT AND CALCIUM
Calcium: 9.1 mg/dL (ref 8.6–10.4)
PTH: 69 pg/mL (ref 16–77)

## 2020-11-23 ENCOUNTER — Other Ambulatory Visit (INDEPENDENT_AMBULATORY_CARE_PROVIDER_SITE_OTHER): Payer: Self-pay | Admitting: Ophthalmology

## 2020-12-05 ENCOUNTER — Encounter (INDEPENDENT_AMBULATORY_CARE_PROVIDER_SITE_OTHER): Payer: Medicare Other | Admitting: Ophthalmology

## 2021-01-17 ENCOUNTER — Telehealth: Payer: Self-pay | Admitting: Internal Medicine

## 2021-01-17 NOTE — Telephone Encounter (Signed)
[facility] FYI:  ---Caller states that her area where gallbladder is swollen. Feeling nauseated. Pain is achy. Recently constipated. Pain is constant. Rates pain 4/10.  Advised to go to ED now

## 2021-01-24 DIAGNOSIS — Z20822 Contact with and (suspected) exposure to covid-19: Secondary | ICD-10-CM | POA: Diagnosis not present

## 2021-02-13 ENCOUNTER — Encounter (INDEPENDENT_AMBULATORY_CARE_PROVIDER_SITE_OTHER): Payer: Medicare Other | Admitting: Ophthalmology

## 2021-02-13 ENCOUNTER — Other Ambulatory Visit: Payer: Self-pay

## 2021-02-13 ENCOUNTER — Encounter: Payer: Self-pay | Admitting: Internal Medicine

## 2021-02-13 ENCOUNTER — Ambulatory Visit (INDEPENDENT_AMBULATORY_CARE_PROVIDER_SITE_OTHER): Payer: Medicare Other | Admitting: Internal Medicine

## 2021-02-13 VITALS — BP 122/76 | HR 83 | Temp 98.1°F | Ht 67.0 in | Wt 182.0 lb

## 2021-02-13 DIAGNOSIS — R634 Abnormal weight loss: Secondary | ICD-10-CM | POA: Diagnosis not present

## 2021-02-13 DIAGNOSIS — K2900 Acute gastritis without bleeding: Secondary | ICD-10-CM

## 2021-02-13 DIAGNOSIS — K2970 Gastritis, unspecified, without bleeding: Secondary | ICD-10-CM | POA: Insufficient documentation

## 2021-02-13 DIAGNOSIS — E1165 Type 2 diabetes mellitus with hyperglycemia: Secondary | ICD-10-CM

## 2021-02-13 DIAGNOSIS — R1310 Dysphagia, unspecified: Secondary | ICD-10-CM

## 2021-02-13 LAB — MICROALBUMIN / CREATININE URINE RATIO
Creatinine,U: 88 mg/dL
Microalb Creat Ratio: 0.8 mg/g (ref 0.0–30.0)
Microalb, Ur: <0.7 mg/dL (ref 0.0–1.9)

## 2021-02-13 LAB — URINALYSIS, ROUTINE W REFLEX MICROSCOPIC
Bilirubin Urine: NEGATIVE
Hgb urine dipstick: NEGATIVE
Ketones, ur: NEGATIVE
Leukocytes,Ua: NEGATIVE
Nitrite: NEGATIVE
Specific Gravity, Urine: 1.025 (ref 1.000–1.030)
Total Protein, Urine: NEGATIVE
Urine Glucose: NEGATIVE
Urobilinogen, UA: 1 (ref 0.0–1.0)
pH: 6 (ref 5.0–8.0)

## 2021-02-13 MED ORDER — PANTOPRAZOLE SODIUM 40 MG PO TBEC: 40.0000 mg | DELAYED_RELEASE_TABLET | Freq: Two times a day (BID) | ORAL | 3 refills | Status: DC

## 2021-02-13 NOTE — Assessment & Plan Note (Signed)
Oropharyngeal to large chunk solids only, continue small solids and liquids, declines speech eval or GI for now

## 2021-02-13 NOTE — Assessment & Plan Note (Signed)
Lab Results  °Component Value Date  ° HGBA1C 5.6 10/31/2020  ° °Stable, pt to continue current medical treatment  - diet ° °

## 2021-02-13 NOTE — Assessment & Plan Note (Signed)
Mild overall without red flags associated such as related wt loss or vomiting or blood or fever; ok for change prilosec 20 to protonix 40 bid (for at least one wk, then change to 1 qd if improved), will hold on labs and imaging for now, pt to f/u any worsening s/s

## 2021-02-13 NOTE — Assessment & Plan Note (Signed)
6-7 lbs recent, unclear reason, follow closely,  to f/u any worsening symptoms or concerns

## 2021-02-13 NOTE — Progress Notes (Signed)
Patient ID: Danielle Newman, female   DOB: 03/09/1932, 87 y.o.   MRN: [id number]        Chief Complaint: follow up epigastric pain and fat fold discomfort       HPI:  Danielle Newman is a 87 y.o. female here with c/o epigastric gnawing pain and mild discomort associated with intermittent nausea and intermittent mild worsening reflux, but Denies worsening vomiting, bowel change or blood.  Does have very mild dysphagia in the neck area with trying to swallow large solids but no problem with small solids and liquids.  Has lost several lbs recently but does not think related to this issue.  Does not want GI referral for now.  Pt denies chest pain, increased sob or doe, wheezing, orthopnea, PND, increased LE swelling, palpitations, dizziness or syncope.   Pt denies polydipsia, polyuria, or new focal neuro s/s.  Does also have some discomfort to a mid abd fat fold that is different from the above discomfort, worse to bend forward, better to not do that.  Does not want surgury for loose skin or fat fold surgury.        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 Her smoking use included cigarettes. She smoked an average of 2 packs per day. She has never used smokeless tobacco. She reports that she does not drink alcohol and does not use drugs. family history includes Allergies in her daughter; Cancer in her daughter and mother. Allergies  Allergen Reactions   Fioricet [Butalbital-Apap-Caffeine] Other (See Comments)    confusion   Meperidine Hcl    Current Outpatient Medications on File Prior to Visit  Medication Sig Dispense Refill   aspirin 81 MG tablet Take 81 mg by mouth daily.     citalopram (CELEXA) 20 MG tablet TAKE ONE TABLET BY MOUTH DAILY 90 tablet 2   diazepam (VALIUM) 10 MG tablet Take 1 tablet (10 mg total) by mouth 2 (two) times daily as needed. 60 tablet 1   doxycycline (VIBRA-TABS) 100 MG tablet Take 1 tablet (100 mg total) by mouth 2 (two) times daily. 20 tablet 0   levothyroxine (SYNTHROID) 100 MCG tablet TAKE ONE TABLET BY MOUTH DAILY 90 tablet 1   lovastatin (MEVACOR) 40 MG tablet TAKE ONE TABLET BY MOUTH DAILY 90 tablet 1   neomycin-polymyxin b-dexamethasone (MAXITROL) 3.5-10000-0.1 OINT      timolol (TIMOPTIC) 0.5 % ophthalmic solution INSTILL 1 DROP IN EACH EYE TWO TIMES A DAY 15 mL 12   timolol (TIMOPTIC)  0.5 % ophthalmic solution Apply to eye.     No current facility-administered medications on file prior to visit.        ROS:  All others reviewed and negative.  Objective        PE:  BP 122/76 (BP Location: Right Arm, Patient Position: Sitting, Cuff Size: Normal)   Pulse 83   Temp 98.1 F (36.7 C) (Oral)   Ht 5\' 7"  (1.702 m)   Wt 182 lb (82.6 kg)   SpO2 95%   BMI 28.51 kg/m                 Constitutional: Pt appears in NAD               HENT: Head: NCAT.                Right Ear: External ear normal.                 Left Ear: External ear normal.                Eyes: . Pupils are equal, round, and reactive to light. Conjunctivae and EOM are normal               Nose: without d/c or deformity                Neck: Neck supple. Gross normal ROM               Cardiovascular: Normal rate and regular rhythm.                 Pulmonary/Chest: Effort normal and breath sounds without rales or wheezing.                Abd:  Soft, very mild epigastric tender, ND, + BS, no organomegaly, no guarding or rebound               Neurological: Pt is alert. At baseline orientation, motor grossly intact               Skin: Skin is warm. No rashes, no other new lesions, LE edema - none               Psychiatric: Pt behavior is normal without agitation   Micro: none  Cardiac tracings I have personally interpreted today:  none  Pertinent Radiological findings (summarize): none   Lab Results  Component Value Date   WBC 9.5 10/31/2020   HGB 13.8 10/31/2020   HCT 40.9 10/31/2020   PLT 274.0 10/31/2020   GLUCOSE 86 10/31/2020   CHOL 148 10/31/2020   TRIG 192.0 (H) 10/31/2020   HDL 36.20 (L) 10/31/2020   LDLDIRECT 100.0 05/16/2018   LDLCALC 73 10/31/2020   ALT 12 10/31/2020   AST 14 10/31/2020   NA 139 10/31/2020   K 4.4 10/31/2020   CL 103 10/31/2020   CREATININE 0.88 10/31/2020   BUN 18 10/31/2020   CO2 28 10/31/2020   TSH 1.17 10/31/2020   HGBA1C 5.6 10/31/2020   MICROALBUR <0.7 02/13/2021   Assessment/Plan:  Danielle Newman is a 87 y.o. White or Caucasian [1] female with  has a past medical history of ALLERGIC RHINITIS (02/11/2008), ANXIETY (02/11/2008), Blind right eye (11/20/2011), CORONARY ARTERY DISEASE (02/11/2008), DEPRESSION (02/11/2008), DM (diabetes mellitus) (HCC) (11/21/2011), DYSPNEA (09/11/2010), GERD (02/11/2008), HYPERLIPIDEMIA (02/11/2008), HYPOTHYROIDISM (02/11/2008), Impaired glucose tolerance (11/20/2011), LUNG CANCER, HX OF (02/11/2008), MACULAR DEGENERATION (03/29/2009), OSTEOPENIA (02/11/2008), Sarcoidosis (02/11/2008), and Sight impaired (04/05/2011).  Gastritis  Mild overall without red flags associated such as related wt loss or vomiting or blood or fever; ok for change prilosec 20 to  protonix 40 bid (for at least one wk, then change to 1 qd if improved), will hold on labs and imaging for now, pt to f/u any worsening s/s  DM (diabetes mellitus) Lab Results  Component Value Date   HGBA1C 5.6 10/31/2020   Stable, pt to continue current medical treatment  - diet   Weight loss 6-7 lbs recent, unclear reason, follow closely,  to f/u any worsening symptoms or concerns  Dysphagia Oropharyngeal to large chunk solids only, continue small solids and liquids, declines speech eval or GI for now  Followup: Return in about 3 months (around 05/16/2021).  Oliver Barre, MD 02/13/2021 7:59 PM [facility] - [facility]

## 2021-02-13 NOTE — Patient Instructions (Signed)
Ok to change the prilosec to protonix 40 mg -  please take this twice per day for one week, then try once per day after that  Please continue all other medications as before, and refills have been done if requested.  Please have the pharmacy call with any other refills you may need.  Please keep your appointments with your specialists as you may have planned  Please make an Appointment to return in 3 months, or sooner if needed

## 2021-03-13 ENCOUNTER — Ambulatory Visit (INDEPENDENT_AMBULATORY_CARE_PROVIDER_SITE_OTHER): Payer: Medicare Other | Admitting: Ophthalmology

## 2021-03-13 ENCOUNTER — Encounter (INDEPENDENT_AMBULATORY_CARE_PROVIDER_SITE_OTHER): Payer: Self-pay | Admitting: Ophthalmology

## 2021-03-13 ENCOUNTER — Other Ambulatory Visit: Payer: Self-pay

## 2021-03-13 DIAGNOSIS — H47291 Other optic atrophy, right eye: Secondary | ICD-10-CM | POA: Diagnosis not present

## 2021-03-13 DIAGNOSIS — H353134 Nonexudative age-related macular degeneration, bilateral, advanced atrophic with subfoveal involvement: Secondary | ICD-10-CM | POA: Diagnosis not present

## 2021-03-13 DIAGNOSIS — H3093 Unspecified chorioretinal inflammation, bilateral: Secondary | ICD-10-CM | POA: Diagnosis not present

## 2021-03-13 NOTE — Assessment & Plan Note (Signed)
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

## 2021-03-13 NOTE — Progress Notes (Signed)
03/13/2021     CHIEF COMPLAINT Patient presents for Retina Evaluation   HISTORY OF PRESENT ILLNESS: Danielle Newman is a 87 y.o. female who presents to the clinic today for:   HPI     Retina Evaluation           Laterality: both eyes   Onset: 3 years ago   Duration: 3 years   Associated Symptoms: Flashes and Floaters   Context: distance vision   Treatments tried: eye drops, laser and injection   Response to treatment: mild improvement         Comments   LOV 3 yrs ago due to covid and having to r/s appt's. F/u from Dr. Alben Spittle office 2021. Patient states vision is stable and unchanged. Pt states she has noticed flashes of light, longterm and stable. Pt states she notices a floater in her left eye. Pt. uses timolol bid ou.       Last edited by Nelva Nay, COA on 03/13/2021  3:35 PM.      Referring physician: Corwin Levins, MD [address] [place],  [place] [address]  HISTORICAL INFORMATION:   Selected notes from the MEDICAL RECORD NUMBER    Lab Results  Component Value Date   HGBA1C 5.6 10/31/2020     CURRENT MEDICATIONS: Current Outpatient Medications (Ophthalmic Drugs)  Medication Sig   neomycin-polymyxin b-dexamethasone (MAXITROL) 3.5-10000-0.1 OINT    timolol (TIMOPTIC) 0.5 % ophthalmic solution INSTILL 1 DROP IN [facility] EYE TWO TIMES A DAY   timolol (TIMOPTIC) 0.5 % ophthalmic solution Apply to eye.   No current facility-administered medications for this visit. (Ophthalmic Drugs)   Current Outpatient Medications (Other)  Medication Sig   aspirin 81 MG tablet Take 81 mg by mouth daily.   citalopram (CELEXA) 20 MG tablet TAKE ONE TABLET BY MOUTH DAILY   diazepam (VALIUM) 10 MG tablet Take 1 tablet (10 mg total) by mouth 2 (two) times daily as needed.   doxycycline (VIBRA-TABS) 100 MG tablet Take 1 tablet (100 mg total) by mouth 2 (two) times daily.   levothyroxine (SYNTHROID) 100 MCG tablet TAKE ONE TABLET BY MOUTH DAILY   lovastatin (MEVACOR)  40 MG tablet TAKE ONE TABLET BY MOUTH DAILY   pantoprazole (PROTONIX) 40 MG tablet Take 1 tablet (40 mg total) by mouth 2 (two) times daily.   No current facility-administered medications for this visit. 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; since 2008   CORONARY ARTERY DISEASE 02/11/2008   DEPRESSION 02/11/2008   DM (diabetes mellitus) (HCC) 11/21/2011   DYSPNEA 09/11/2010   GERD 02/11/2008   HYPERLIPIDEMIA 02/11/2008   HYPOTHYROIDISM 02/11/2008   Impaired glucose tolerance 11/20/2011   LUNG CANCER, HX OF 02/11/2008   MACULAR DEGENERATION 03/29/2009   OSTEOPENIA 02/11/2008   Sarcoidosis 02/11/2008   Sight impaired 04/05/2011   Past Surgical History:  Procedure Laterality Date   s/p arm surgury  05/29/1994   s/p left upper lobectomy      FAMILY HISTORY Family History  Problem Relation Age of Onset   Cancer Mother        thyroid cancer   Allergies Daughter    Cancer Daughter  breast cancer    SOCIAL HISTORY Social History   Tobacco Use   Smoking status: Former    Packs/day: 2.00    Types: Cigarettes    Quit date: 05/29/1994    Years since quitting: 26.8   Smokeless tobacco: Never  Substance Use Topics   Alcohol use: No   Drug use: No         OPHTHALMIC EXAM:  Base Eye Exam     Visual Acuity (ETDRS)       Right Left   Dist [place] NLP CF at 3'   Dist ph [place]  20/400         Tonometry (Tonopen, 3:48 PM)       Right Left   Pressure 13 11         Pupils       Dark Light Shape React APD   Right   Irregular Minimal Trace   Left 5 4 Round Brisk None         Visual Fields (Counting fingers)       Left Right    Full    Restrictions  Total superior temporal,  inferior temporal, superior nasal, inferior nasal deficiencies         Extraocular Movement       Right Left    Full Full         Neuro/Psych     Oriented x3: Yes   Mood/Affect: Normal         Dilation     Both eyes: 1.0% Mydriacyl, 2.5% Phenylephrine @ 3:48 PM           Slit Lamp and Fundus Exam     External Exam       Right Left   External Normal Normal         Slit Lamp Exam       Right Left   Lids/Lashes Normal Normal   Conjunctiva/Sclera White and quiet White and quiet   Cornea Clear Clear   Anterior Chamber Deep and quiet Deep and quiet   Iris Round and reactive Round and reactive   Lens Clear Clear   Anterior Vitreous Normal Normal         Fundus Exam       Right Left   Posterior Vitreous Posterior vitreous detachment Posterior vitreous detachment   Disc 4+ Optic disc atrophy, 4+ Pallor Peripapillary atrophy   C/D Ratio 1.0 1.0   Macula Retinal pigment epithelial atrophy - geographic, with peripapillary atrophy and disciform scarring and atrophy in the macular approximately 15 disc areas in size Retinal pigment epithelial atrophy - geographic with confluent geographic atrophy approximately 18-22 disc areas in size in the macula.   Vessels Normal Normal   Periphery Normal Normal            IMAGING AND PROCEDURES  Imaging and Procedures for 03/13/21  OCT, Retina - OU - Both Eyes       Right Eye Quality was good. Scan locations included subfoveal. Central Foveal Thickness: 338. Progression has been stable. Findings include outer retinal atrophy, no IRF, no SRF.   Left Eye Quality was good. Scan locations included subfoveal. Central Foveal Thickness: 409. Progression has been stable. Findings include outer retinal atrophy, abnormal foveal contour, no IRF, no SRF.   Notes PVD OU     Color Fundus Photography Optos - OU - Both Eyes       Right Eye Progression has been stable. Disc findings include increased cup to disc  ratio,  pallor. Macula : retinal pigment epithelium abnormalities, geographic atrophy. Vessels : normal observations.   Left Eye Progression has been stable. Macula : geographic atrophy, retinal pigment epithelium abnormalities.   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 Advanced nonexudative age-related macular degeneration of both eyes with subfoveal involvement  H35.3134 OCT, Retina - OU - Both Eyes    Color Fundus Photography Optos - OU - Both Eyes    2. 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 Post sarcoid disease  H47.291       1.  No signs of active disease OU.  No inflammatory activity.  Macular condition limits acuity OS.  Advanced optic nerve atrophy nonperfusion right eye accounts for acuity stable now over many years  2.  3.  Ophthalmic Meds Ordered this visit:  No orders of the defined types were placed in this encounter.      Return in about 2 years (around 03/14/2023) for DILATE OU, OCT, COLOR FP.  There are no Patient Instructions on file for this visit.   Explained the diagnoses, plan, and follow up with the patient and they expressed understanding.  Patient expressed understanding of the importance of proper follow up care.   Alford Highland Rital Cavey M.D. Diseases & Surgery of the Retina and Vitreous [facility] 03/13/21     Abbreviations: M myopia (nearsighted); A astigmatism; H hyperopia (farsighted); P presbyopia; Mrx spectacle prescription;  CTL contact lenses; OD right eye; OS left eye; OU both eyes  XT exotropia; ET esotropia; PEK punctate epithelial keratitis; PEE punctate epithelial erosions; DES dry eye syndrome; MGD meibomian gland  dysfunction; ATs artificial tears; PFAT's preservative free artificial tears; NSC nuclear sclerotic cataract; PSC posterior subcapsular cataract; ERM epi-retinal membrane; PVD posterior vitreous detachment; RD retinal detachment; DM diabetes mellitus; DR diabetic retinopathy; NPDR non-proliferative diabetic retinopathy; PDR proliferative diabetic retinopathy; CSME clinically significant macular edema; DME diabetic macular edema; dbh dot blot hemorrhages; CWS cotton wool spot; POAG primary open angle glaucoma; C/D cup-to-disc ratio; HVF humphrey visual field; GVF goldmann visual field; OCT optical coherence tomography; IOP intraocular pressure; BRVO Branch retinal vein occlusion; CRVO central retinal vein occlusion; CRAO central retinal artery occlusion; BRAO branch retinal artery occlusion; RT retinal tear; SB scleral buckle; PPV pars plana vitrectomy; VH Vitreous hemorrhage; PRP panretinal laser photocoagulation; IVK intravitreal kenalog; VMT vitreomacular traction; MH Macular hole;  NVD neovascularization of the disc; NVE neovascularization elsewhere; AREDS age related eye disease study; ARMD age related macular degeneration; POAG primary open angle glaucoma; EBMD epithelial/anterior basement membrane dystrophy; ACIOL anterior chamber intraocular lens; IOL intraocular lens; PCIOL posterior chamber intraocular lens; Phaco/IOL phacoemulsification with intraocular lens placement; PRK photorefractive keratectomy; LASIK laser assisted in situ keratomileusis; HTN hypertension; DM diabetes mellitus; COPD chronic obstructive pulmonary disease

## 2021-03-13 NOTE — Assessment & Plan Note (Signed)
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

## 2021-03-15 ENCOUNTER — Other Ambulatory Visit: Payer: Self-pay | Admitting: Internal Medicine

## 2021-03-15 NOTE — Telephone Encounter (Signed)
Please refill as per office routine med refill policy (all routine meds refilled for 3 mo or monthly per pt preference up to one year from last visit, then month to month grace period for 3 mo, then further med refills will have to be denied)  

## 2021-03-21 ENCOUNTER — Other Ambulatory Visit: Payer: Self-pay

## 2021-03-21 MED ORDER — LOVASTATIN 40 MG PO TABS: 40.0000 mg | ORAL_TABLET | Freq: Every day | ORAL | 1 refills | Status: DC

## 2021-03-27 ENCOUNTER — Telehealth: Payer: Self-pay | Admitting: Internal Medicine

## 2021-03-27 NOTE — Telephone Encounter (Signed)
Tried calling patient to schedule Medicare Annual Wellness Visit but phone continues to be busy.  No hx of AWV eligible as of 07/30/09  Please schedule at anytime with [facility] if patient calls the office back.    40 Minutes appointment   Any questions, please call me at [phone]

## 2021-05-19 ENCOUNTER — Encounter: Payer: Self-pay | Admitting: Internal Medicine

## 2021-05-19 ENCOUNTER — Ambulatory Visit (INDEPENDENT_AMBULATORY_CARE_PROVIDER_SITE_OTHER): Payer: Medicare Other | Admitting: Internal Medicine

## 2021-05-19 ENCOUNTER — Other Ambulatory Visit: Payer: Self-pay

## 2021-05-19 VITALS — BP 116/70 | HR 73 | Ht 67.0 in | Wt 177.0 lb

## 2021-05-19 DIAGNOSIS — E559 Vitamin D deficiency, unspecified: Secondary | ICD-10-CM

## 2021-05-19 DIAGNOSIS — K2900 Acute gastritis without bleeding: Secondary | ICD-10-CM

## 2021-05-19 DIAGNOSIS — Z23 Encounter for immunization: Secondary | ICD-10-CM

## 2021-05-19 DIAGNOSIS — E1165 Type 2 diabetes mellitus with hyperglycemia: Secondary | ICD-10-CM | POA: Diagnosis not present

## 2021-05-19 DIAGNOSIS — K219 Gastro-esophageal reflux disease without esophagitis: Secondary | ICD-10-CM

## 2021-05-19 DIAGNOSIS — N183 Chronic kidney disease, stage 3 unspecified: Secondary | ICD-10-CM | POA: Diagnosis not present

## 2021-05-19 NOTE — Patient Instructions (Signed)

## 2021-05-19 NOTE — Progress Notes (Signed)
Patient ID: Danielle Newman, female   DOB: May 12, 1932, 87 y.o.   MRN: [id number]        Chief Complaint: follow up gastrtitis, low vit d, ckd 3, gerd, dm       HPI:  Danielle Newman is a 87 y.o. female here overall doing ok with daughter, Denies worsening reflux, abd pain, dysphagia, n/v, bowel change or blood.  Pt denies chest pain, increased sob or doe, wheezing, orthopnea, PND, increased LE swelling, palpitations, dizziness or syncope.   Pt denies polydipsia, polyuria, or new focal neuro s/s.   Pt denies fever, night sweats, loss of appetite, or other constitutional symptoms    peak wt in past has been 238, now to 177 gradually.  Last fall > 2 yrs ago.  Taking vit d but not sure how much.  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 Her smoking use included cigarettes. She smoked an average of 2 packs per day. She has never used smokeless tobacco. She reports that she does not drink alcohol and does not use drugs. family history includes Allergies in her daughter; Cancer in her daughter and mother. Allergies  Allergen  Reactions   Fioricet [Butalbital-Apap-Caffeine] Other (See Comments)    confusion   Meperidine Hcl    Current Outpatient Medications on File Prior to Visit  Medication Sig Dispense Refill   aspirin 81 MG tablet Take 81 mg by mouth daily.     citalopram (CELEXA) 20 MG tablet TAKE ONE TABLET BY MOUTH DAILY 90 tablet 2   diazepam (VALIUM) 10 MG tablet Take 1 tablet (10 mg total) by mouth 2 (two) times daily as needed. 60 tablet 1   doxycycline (VIBRA-TABS) 100 MG tablet Take 1 tablet (100 mg total) by mouth 2 (two) times daily. 20 tablet 0   levothyroxine (SYNTHROID) 100 MCG tablet TAKE ONE TABLET BY MOUTH DAILY 90 tablet 1   lovastatin (MEVACOR) 40 MG tablet Take 1 tablet (40 mg total) by mouth daily. 90 tablet 1   neomycin-polymyxin b-dexamethasone (MAXITROL) 3.5-10000-0.1 OINT      pantoprazole (PROTONIX) 40 MG tablet Take 1 tablet (40 mg total) by mouth 2 (two) times daily. 180 tablet 3   timolol (TIMOPTIC) 0.5 % ophthalmic solution INSTILL 1 DROP IN EACH EYE TWO TIMES A DAY 15 mL 12   timolol (TIMOPTIC) 0.5 % ophthalmic solution Apply to eye.     No current facility-administered medications on file prior to visit.  ROS:  All others reviewed and negative.  Objective        PE:  BP 116/70 (BP Location: Right Arm, Patient Position: Sitting, Cuff Size: Large)   Pulse 73   Ht 5\' 7"  (1.702 m)   Wt 177 lb (80.3 kg)   SpO2 97%   BMI 27.72 kg/m                 Constitutional: Pt appears in NAD               HENT: Head: NCAT.                Right Ear: External ear normal.                 Left Ear: External ear normal.                Eyes: . Pupils are equal, round, and reactive to light. Conjunctivae and EOM are normal               Nose: without d/c or deformity               Neck: Neck supple. Gross normal ROM               Cardiovascular: Normal rate and regular rhythm.                 Pulmonary/Chest: Effort normal and breath sounds without rales or wheezing.                 Abd:  Soft, NT, ND, + BS, no organomegaly               Neurological: Pt is alert. At baseline orientation, motor grossly intact               Skin: Skin is warm. No rashes, no other new lesions, LE edema - none               Psychiatric: Pt behavior is normal without agitation   Micro: none  Cardiac tracings I have personally interpreted today:  none  Pertinent Radiological findings (summarize): none   Lab Results  Component Value Date   WBC 9.5 10/31/2020   HGB 13.8 10/31/2020   HCT 40.9 10/31/2020   PLT 274.0 10/31/2020   GLUCOSE 109 (H) 05/19/2021   CHOL 148 10/31/2020   TRIG 192.0 (H) 10/31/2020   HDL 36.20 (L) 10/31/2020   LDLDIRECT 100.0 05/16/2018   LDLCALC 73 10/31/2020   ALT 12 10/31/2020   AST 14 10/31/2020   NA 145 05/19/2021   K 5.7 (H) 05/19/2021   CL 106 05/19/2021   CREATININE 1.25 (H) 05/19/2021   BUN 20 05/19/2021   CO2 25 05/19/2021   TSH 1.17 10/31/2020   HGBA1C 5.6 10/31/2020   MICROALBUR <0.7 02/13/2021   Assessment/Plan:  Danielle Newman is a 87 y.o. White or Caucasian [1] female with  has a past medical history of ALLERGIC RHINITIS (02/11/2008), ANXIETY (02/11/2008), Blind right eye (11/20/2011), CORONARY ARTERY DISEASE (02/11/2008), DEPRESSION (02/11/2008), DM (diabetes mellitus) (HCC) (11/21/2011), DYSPNEA (09/11/2010), GERD (02/11/2008), HYPERLIPIDEMIA (02/11/2008), HYPOTHYROIDISM (02/11/2008), Impaired glucose tolerance (11/20/2011), LUNG CANCER, HX OF (02/11/2008), MACULAR DEGENERATION (03/29/2009), OSTEOPENIA (02/11/2008), Sarcoidosis (02/11/2008), and Sight impaired (04/05/2011).  GERD Stable overal, well controlled, to continue PPI  DM (diabetes mellitus) Lab Results  Component Value Date   HGBA1C 5.6 10/31/2020   Stable, pt to continue current medical treatment  - diet   CKD (chronic kidney  disease) stage 3, GFR 30-59 ml/min Lab Results  Component Value Date   CREATININE 1.25 (H) 05/19/2021   Stable overall, cont to avoid  nephrotoxins   Vitamin D deficiency Last vitamin D Lab Results  Component Value Date   VD25OH 32.88 10/31/2020   Remains borderline low, to double oral replacement   Gastritis Resolved per pt,  to f/u any worsening symptoms or concerns  Followup: Return in about 6 months (around 11/17/2021).  Oliver Barre, MD 05/20/2021 4:18 PM [facility]

## 2021-05-20 ENCOUNTER — Encounter: Payer: Self-pay | Admitting: Internal Medicine

## 2021-05-20 LAB — BASIC METABOLIC PANEL
BUN/Creatinine Ratio: 16 (calc) (ref 6–22)
BUN: 20 mg/dL (ref 7–25)
CO2: 25 mmol/L (ref 20–32)
Calcium: 10.3 mg/dL (ref 8.6–10.4)
Chloride: 106 mmol/L (ref 98–110)
Creat: 1.25 mg/dL — ABNORMAL HIGH (ref 0.60–0.95)
Glucose, Bld: 109 mg/dL — ABNORMAL HIGH (ref 65–99)
Potassium: 5.7 mmol/L — ABNORMAL HIGH (ref 3.5–5.3)
Sodium: 145 mmol/L (ref 135–146)

## 2021-05-20 NOTE — Assessment & Plan Note (Signed)
Lab Results  °Component Value Date  ° HGBA1C 5.6 10/31/2020  ° °Stable, pt to continue current medical treatment  - diet ° °

## 2021-05-20 NOTE — Assessment & Plan Note (Signed)
Lab Results  Component Value Date   CREATININE 1.25 (H) 05/19/2021   Stable overall, cont to avoid nephrotoxins

## 2021-05-20 NOTE — Assessment & Plan Note (Signed)
Stable overal, well controlled, to continue PPI

## 2021-05-20 NOTE — Assessment & Plan Note (Signed)
Last vitamin D Lab Results  Component Value Date   VD25OH 32.88 10/31/2020   Remains borderline low, to double oral replacement

## 2021-05-20 NOTE — Assessment & Plan Note (Signed)
Resolved per pt,  to f/u any worsening symptoms or concerns

## 2021-05-21 ENCOUNTER — Encounter: Payer: Self-pay | Admitting: Internal Medicine

## 2021-06-12 DIAGNOSIS — H9113 Presbycusis, bilateral: Secondary | ICD-10-CM | POA: Diagnosis not present

## 2021-06-12 DIAGNOSIS — H938X3 Other specified disorders of ear, bilateral: Secondary | ICD-10-CM | POA: Diagnosis not present

## 2021-09-11 ENCOUNTER — Other Ambulatory Visit: Payer: Self-pay | Admitting: Internal Medicine

## 2021-09-11 NOTE — Telephone Encounter (Signed)
Please refill as per office routine med refill policy (all routine meds to be refilled for 3 mo or monthly (per pt preference) up to one year from last visit, then month to month grace period for 3 mo, then further med refills will have to be denied) ? ?

## 2021-09-22 ENCOUNTER — Ambulatory Visit: Payer: Medicare Other | Admitting: Internal Medicine

## 2021-10-10 ENCOUNTER — Other Ambulatory Visit: Payer: Self-pay | Admitting: Internal Medicine

## 2021-10-10 NOTE — Telephone Encounter (Signed)
Please refill as per office routine med refill policy (all routine meds to be refilled for 3 mo or monthly (per pt preference) up to one year from last visit, then month to month grace period for 3 mo, then further med refills will have to be denied) ? ?

## 2021-10-18 ENCOUNTER — Telehealth: Payer: Self-pay | Admitting: Internal Medicine

## 2021-10-18 NOTE — Telephone Encounter (Signed)
1.Medication Requested:levothyroxine (SYNTHROID) 100 MCG tablet ? ?2. Pharmacy (Name, Street, [facility]): [facility] [id number] - [place], [place] - [address] ? ?3. On Med List: Y ? ?4. Last Visit with PCP: 05-19-2021 ? ?5. Next visit date with PCP: 10-27-2021 ? ? ?

## 2021-10-19 MED ORDER — LEVOTHYROXINE SODIUM 100 MCG PO TABS: 100.0000 ug | ORAL_TABLET | Freq: Every day | ORAL | 0 refills | Status: DC

## 2021-10-27 ENCOUNTER — Ambulatory Visit: Payer: Medicare Other | Admitting: Internal Medicine

## 2021-10-27 DIAGNOSIS — H6123 Impacted cerumen, bilateral: Secondary | ICD-10-CM | POA: Insufficient documentation

## 2021-11-11 ENCOUNTER — Other Ambulatory Visit: Payer: Self-pay | Admitting: Internal Medicine

## 2021-11-11 NOTE — Telephone Encounter (Signed)
Please refill as per office routine med refill policy (all routine meds to be refilled for 3 mo or monthly (per pt preference) up to one year from last visit, then month to month grace period for 3 mo, then further med refills will have to be denied) ? ?

## 2021-11-15 ENCOUNTER — Other Ambulatory Visit: Payer: Self-pay | Admitting: Internal Medicine

## 2021-11-15 NOTE — Telephone Encounter (Signed)
Please refill as per office routine med refill policy (all routine meds to be refilled for 3 mo or monthly (per pt preference) up to one year from last visit, then month to month grace period for 3 mo, then further med refills will have to be denied) ? ?

## 2021-11-24 ENCOUNTER — Ambulatory Visit: Payer: Medicare Other | Admitting: Internal Medicine

## 2021-11-27 ENCOUNTER — Ambulatory Visit: Payer: Medicare Other | Admitting: Internal Medicine

## 2021-12-08 ENCOUNTER — Ambulatory Visit (INDEPENDENT_AMBULATORY_CARE_PROVIDER_SITE_OTHER): Payer: Medicare Other | Admitting: Internal Medicine

## 2021-12-08 ENCOUNTER — Encounter: Payer: Self-pay | Admitting: Internal Medicine

## 2021-12-08 VITALS — BP 132/78 | HR 71 | Temp 98.1°F | Ht 67.0 in | Wt 173.0 lb

## 2021-12-08 DIAGNOSIS — E538 Deficiency of other specified B group vitamins: Secondary | ICD-10-CM | POA: Diagnosis not present

## 2021-12-08 DIAGNOSIS — E559 Vitamin D deficiency, unspecified: Secondary | ICD-10-CM

## 2021-12-08 DIAGNOSIS — R634 Abnormal weight loss: Secondary | ICD-10-CM | POA: Diagnosis not present

## 2021-12-08 DIAGNOSIS — E1165 Type 2 diabetes mellitus with hyperglycemia: Secondary | ICD-10-CM | POA: Diagnosis not present

## 2021-12-08 DIAGNOSIS — G8929 Other chronic pain: Secondary | ICD-10-CM

## 2021-12-08 DIAGNOSIS — Z0001 Encounter for general adult medical examination with abnormal findings: Secondary | ICD-10-CM | POA: Diagnosis not present

## 2021-12-08 DIAGNOSIS — J309 Allergic rhinitis, unspecified: Secondary | ICD-10-CM

## 2021-12-08 DIAGNOSIS — M545 Low back pain, unspecified: Secondary | ICD-10-CM | POA: Diagnosis not present

## 2021-12-08 LAB — BASIC METABOLIC PANEL
BUN: 21 mg/dL (ref 6–23)
CO2: 33 mEq/L — ABNORMAL HIGH (ref 19–32)
Calcium: 10 mg/dL (ref 8.4–10.5)
Chloride: 102 mEq/L (ref 96–112)
Creatinine, Ser: 1.05 mg/dL (ref 0.40–1.20)
GFR: 47.67 mL/min — ABNORMAL LOW (ref >60.00)
Glucose, Bld: 106 mg/dL — ABNORMAL HIGH (ref 70–99)
Potassium: 5.1 mEq/L (ref 3.5–5.1)
Sodium: 140 mEq/L (ref 135–145)

## 2021-12-08 LAB — TSH: TSH: 0.78 u[IU]/mL (ref 0.35–5.50)

## 2021-12-08 LAB — LIPID PANEL
Cholesterol: 167 mg/dL (ref 0–200)
HDL: 37.6 mg/dL — ABNORMAL LOW (ref >39.00)
NonHDL: 129.28
Total CHOL/HDL Ratio: 4
Triglycerides: 252 mg/dL — ABNORMAL HIGH (ref 0.0–149.0)
VLDL: 50.4 mg/dL — ABNORMAL HIGH (ref 0.0–40.0)

## 2021-12-08 LAB — HEPATIC FUNCTION PANEL
ALT: 12 U/L (ref 0–35)
AST: 14 U/L (ref 0–37)
Albumin: 4.1 g/dL (ref 3.5–5.2)
Alkaline Phosphatase: 64 U/L (ref 39–117)
Bilirubin, Direct: 0 mg/dL (ref 0.0–0.3)
Total Bilirubin: 0.4 mg/dL (ref 0.2–1.2)
Total Protein: 7.9 g/dL (ref 6.0–8.3)

## 2021-12-08 LAB — CBC WITH DIFFERENTIAL/PLATELET
Basophils Absolute: 0.1 10*3/uL (ref 0.0–0.1)
Basophils Relative: 1.2 % (ref 0.0–3.0)
Eosinophils Absolute: 0.3 10*3/uL (ref 0.0–0.7)
Eosinophils Relative: 3.4 % (ref 0.0–5.0)
HCT: 38.3 % (ref 36.0–46.0)
Hemoglobin: 12.9 g/dL (ref 12.0–15.0)
Lymphocytes Relative: 25 % (ref 12.0–46.0)
Lymphs Abs: 2.4 10*3/uL (ref 0.7–4.0)
MCHC: 33.6 g/dL (ref 30.0–36.0)
MCV: 87 fl (ref 78.0–100.0)
Monocytes Absolute: 0.7 10*3/uL (ref 0.1–1.0)
Monocytes Relative: 7.8 % (ref 3.0–12.0)
Neutro Abs: 5.9 10*3/uL (ref 1.4–7.7)
Neutrophils Relative %: 62.6 % (ref 43.0–77.0)
Platelets: 318 10*3/uL (ref 150.0–400.0)
RBC: 4.4 Mil/uL (ref 3.87–5.11)
RDW: 14.6 % (ref 11.5–15.5)
WBC: 9.4 10*3/uL (ref 4.0–10.5)

## 2021-12-08 LAB — URINALYSIS, ROUTINE W REFLEX MICROSCOPIC
Bilirubin Urine: NEGATIVE
Hgb urine dipstick: NEGATIVE
Ketones, ur: NEGATIVE
Leukocytes,Ua: NEGATIVE
Nitrite: NEGATIVE
RBC / HPF: NONE SEEN (ref 0–?)
Specific Gravity, Urine: 1.01 (ref 1.000–1.030)
Total Protein, Urine: NEGATIVE
Urine Glucose: NEGATIVE
Urobilinogen, UA: 0.2 (ref 0.0–1.0)
WBC, UA: NONE SEEN (ref 0–?)
pH: 6 (ref 5.0–8.0)

## 2021-12-08 LAB — MICROALBUMIN / CREATININE URINE RATIO
Creatinine,U: 16.4 mg/dL
Microalb Creat Ratio: 4.3 mg/g (ref 0.0–30.0)
Microalb, Ur: <0.7 mg/dL (ref 0.0–1.9)

## 2021-12-08 LAB — HEMOGLOBIN A1C: Hgb A1c MFr Bld: 5.6 % (ref 4.6–6.5)

## 2021-12-08 LAB — VITAMIN B12: Vitamin B-12: 317 pg/mL (ref 211–911)

## 2021-12-08 LAB — LDL CHOLESTEROL, DIRECT: Direct LDL: 95 mg/dL

## 2021-12-08 LAB — VITAMIN D 25 HYDROXY (VIT D DEFICIENCY, FRACTURES): VITD: 56.97 ng/mL (ref 30.00–100.00)

## 2021-12-08 MED ORDER — CYCLOBENZAPRINE HCL 5 MG PO TABS: 5.0000 mg | ORAL_TABLET | Freq: Three times a day (TID) | ORAL | 2 refills | Status: DC | PRN

## 2021-12-08 NOTE — Progress Notes (Signed)
Patient ID: Danielle Newman, female   DOB: 1931/12/12, 88 y.o.   MRN: [id number] ? ? ? ?     Chief Complaint:: wellness exam and Follow-up (Patient c/o having coughing and post nasal drip constantly ) ? , right lower back pain, wt loss ? ?     HPI:  Danielle AverConstance L Newman is a 88 y.o. female here for wellness exam; declines eye exam for now - will f/u with appt on her own, declines shingrix, tdap, o/w up to date ?         ?              Also has some wt loss recently due less sweets recently, just has less appetite for that now.  Pt c/o to have mild to mod right LBP without bowel or bladder change, fever, wt loss,  worsening LE pain/numbness/weakness, gait change or falls, but pain worse to bend or try to stand up, better to lie down.  Asks for muscle relaxer trial as this has helped in the past.  Pt denies chest pain, increased sob or doe, wheezing, orthopnea, PND, increased LE swelling, palpitations, dizziness or syncope.   Pt denies polydipsia, polyuria, or new focal neuro s/s.    Pt denies fever, wt loss, night sweats, loss of appetite, or other constitutional symptoms  Does have several wks ongoing nasal allergy symptoms with clearish congestion, itch and sneezing, without fever, pain, ST, cough, swelling or wheezing.   ?  ?Wt Readings from Last 3 Encounters:  ?12/08/21 173 lb (78.5 kg)  ?05/19/21 177 lb (80.3 kg)  ?02/13/21 182 lb (82.6 kg)  ? ?BP Readings from Last 3 Encounters:  ?12/08/21 132/78  ?05/19/21 116/70  ?02/13/21 122/76  ? ?Immunization History  ?Administered Date(s) Administered  ? Fluad Quad(high Dose 65+) 06/18/2019, 05/20/2020, 05/19/2021  ? Influenza Whole 09/11/2010, 04/05/2011  ? Influenza, High Dose Seasonal PF 05/17/2017, 05/16/2018  ? Influenza, Seasonal, Injecte, Preservative Fre 08/14/2012  ? Influenza,inj,Quad PF,6+ Mos 04/23/2013, 05/12/2014, 05/31/2015  ? PFIZER(Purple Top)SARS-COV-2 Vaccination 11/23/2019, 12/14/2019  ? Pneumococcal Conjugate-13 04/23/2013  ? Pneumococcal  Polysaccharide-23 01/01/2006, 09/11/2010  ? Td 09/11/2010  ? Zoster, Live 01/01/2006  ? ?There are no preventive care reminders to display for this patient. ? ?  ? ?Past Medical History:  ?Diagnosis Date  ? ALLERGIC RHINITIS 02/11/2008  ? ANXIETY 02/11/2008  ? 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 CORONARY ARTERY DISEASE 02/11/2008  ? DEPRESSION 02/11/2008  ? DM (diabetes mellitus) (HCC) 11/21/2011  ? DYSPNEA 09/11/2010  ? GERD 02/11/2008  ? HYPERLIPIDEMIA 02/11/2008  ? HYPOTHYROIDISM 02/11/2008  ? Impaired glucose tolerance 11/20/2011  ? LUNG CANCER, HX OF 02/11/2008  ? MACULAR DEGENERATION 03/29/2009  ? OSTEOPENIA 02/11/2008  ? Sarcoidosis 02/11/2008  ? Sight impaired 04/05/2011  ? ?Past Surgical History:  ?Procedure Laterality Date  ? s/p arm surgury  05/29/1994  ? s/p left upper lobectomy    ? ? reports that she quit smoking about 27 years ago. Her smoking use included cigarettes. She smoked an average of 2 packs per day. She has never used smokeless tobacco. She reports that she does not drink alcohol and does not use drugs. ?family history includes Allergies in her daughter; Cancer in her daughter and mother. ?Allergies  ?Allergen Reactions  ? Fioricet [Butalbital-Apap-Caffeine] Other (See Comments)  ?  confusion  ? Meperidine Hcl   ? ?Current Outpatient Medications on File Prior to Visit  ?Medication Sig Dispense Refill  ? aspirin 81  MG tablet Take 81 mg by mouth daily.    ? citalopram (CELEXA) 20 MG tablet TAKE ONE TABLET BY MOUTH DAILY 90 tablet 2  ? diazepam (VALIUM) 10 MG tablet Take 1 tablet (10 mg total) by mouth 2 (two) times daily as needed. 60 tablet 1  ? levothyroxine (SYNTHROID) 100 MCG tablet TAKE ONE TABLET BY MOUTH DAILY 30 tablet 0  ? lovastatin (MEVACOR) 40 MG tablet TAKE ONE TABLET BY MOUTH DAILY 30 tablet 0  ? neomycin-polymyxin b-dexamethasone (MAXITROL) 3.5-10000-0.1 OINT     ? pantoprazole (PROTONIX) 40 MG tablet Take 1 tablet (40 mg total) by mouth 2 (two) times daily. 180 tablet 3  ?  timolol (TIMOPTIC) 0.5 % ophthalmic solution INSTILL 1 DROP IN EACH EYE TWO TIMES A DAY 15 mL 12  ? ?No current facility-administered medications on file prior to visit.  ? ?     ROS:  All others reviewed and negative. ? ?Objective  ? ?     PE:  BP 132/78 (BP Location: Left Arm, Patient Position: Sitting, Cuff Size: Large)   Pulse 71   Temp 98.1 ?F (36.7 ?C) (Oral)   Ht 5\' 7"  (1.702 m)   Wt 173 lb (78.5 kg)   SpO2 95%   BMI 27.10 kg/m?  ? ?              Constitutional: Pt appears in NAD ?              HENT: Head: NCAT.  ?              Right Ear: External ear normal.   ?              Left Ear: External ear normal.  ?              Eyes: . Pupils are equal, round, and reactive to light. Conjunctivae and EOM are normal ?              Nose: without d/c or deformity ?              Neck: Neck supple. Gross normal ROM ?              Cardiovascular: Normal rate and regular rhythm.   ?              Pulmonary/Chest: Effort normal and breath sounds without rales or wheezing.  ?              Abd:  Soft, NT, ND, + BS, no organomegaly; spine nontender in the midline ?              Neurological: Pt is alert. At baseline orientation, motor grossly intact ?              Skin: Skin is warm. No rashes, no other new lesions, LE edema - none ?              Psychiatric: Pt behavior is normal without agitation  ? ?Micro: none ? ?Cardiac tracings I have personally interpreted today:  none ? ?Pertinent Radiological findings (summarize): none  ? ?Lab Results  ?Component Value Date  ? WBC 9.4 12/08/2021  ? HGB 12.9 12/08/2021  ? HCT 38.3 12/08/2021  ? PLT 318.0 12/08/2021  ? GLUCOSE 106 (H) 12/08/2021  ? CHOL 167 12/08/2021  ? TRIG 252.0 (H) 12/08/2021  ? HDL 37.60 (L) 12/08/2021  ? LDLDIRECT 95.0 12/08/2021  ? LDLCALC 73 10/31/2020  ? ALT 12  12/08/2021  ? AST 14 12/08/2021  ? NA 140 12/08/2021  ? K 5.1 12/08/2021  ? CL 102 12/08/2021  ? CREATININE 1.05 12/08/2021  ? BUN 21 12/08/2021  ? CO2 33 (H) 12/08/2021  ? TSH 0.78 12/08/2021  ?  HGBA1C 5.6 12/08/2021  ? MICROALBUR <0.7 12/08/2021  ? ?Assessment/Plan:  ?Danielle Newman is a 88 y.o. White or Caucasian [1] female with  has a past medical history of ALLERGIC RHINITIS (02/11/2008), ANXIETY (02/11/2008), Blind right eye (11/20/2011), CORONARY ARTERY DISEASE (02/11/2008), DEPRESSION (02/11/2008), DM (diabetes mellitus) (HCC) (11/21/2011), DYSPNEA (09/11/2010), GERD (02/11/2008), HYPERLIPIDEMIA (02/11/2008), HYPOTHYROIDISM (02/11/2008), Impaired glucose tolerance (11/20/2011), LUNG CANCER, HX OF (02/11/2008), MACULAR DEGENERATION (03/29/2009), OSTEOPENIA (02/11/2008), Sarcoidosis (02/11/2008), and Sight impaired (04/05/2011). ? ?Encounter for well adult exam with abnormal findings ?Age and sex appropriate education and counseling updated with regular exercise and diet ?Referrals for preventative services - pt to call for eye exam ?Immunizations addressed - none needed ?Smoking counseling  - none needed ?Evidence for depression or other mood disorder - none significant ?Most recent labs reviewed. ?I have personally reviewed and have noted: ?1) the patient's medical and social history ?2) The patient's current medications and supplements ?3) The patient's height, weight, and BMI have been recorded in the chart ? ? ?Allergic rhinitis ?Mild to mod seasonal flare, for otc allegra and nasacort asd,  to f/u any worsening symptoms or concerns ? ?DM (diabetes mellitus) ?Lab Results  ?Component Value Date  ? HGBA1C 5.6 12/08/2021  ? ?Stable, pt to continue current medical treatment  - diet ? ? ?Vitamin D deficiency ?Last vitamin D ?Lab Results  ?Component Value Date  ? VD25OH 56.97 12/08/2021  ? ?Stable, cont oral replacement ? ? ?Weight loss ?Mild recent, suspect geriatric decline, will continue to follow ? ?Low back pain ?Mild to mod acute, c/w msk strain, for muscle relaxer prn, also refer sport med for further consdieration per daughter reqeust ? ?Followup: Return in about 6 months (around 06/10/2022). ? ?Oliver Barre, MD  12/10/2021 3:19 PM ?[facility] ?[facility] ?Internal Medicine ?

## 2021-12-08 NOTE — Patient Instructions (Signed)
Please take all new medication as prescribed  - the muscle relaxer as needed ? ?Please continue all other medications as before, and refills have been done if requested. ? ?Please have the pharmacy call with any other refills you may need. ? ?Please continue your efforts at being more active, low cholesterol diet ? ?You are otherwise up to date with prevention measures today. ? ?You are given the Handicapped Parking application signed today ? ?Please keep your appointments with your specialists as you may have planned ? ?You will be contacted regarding the referral for: Sports Medicine ? ?Please go to the LAB at the blood drawing area for the tests to be done ? ?You will be contacted by phone if any changes need to be made immediately.  Otherwise, you will receive a letter about your results with an explanation, but please check with MyChart first. ? ?Please remember to sign up for MyChart if you have not done so, as this will be important to you in the future with finding out test results, communicating by private email, and scheduling acute appointments online when needed. ? ?Please make an Appointment to return in 6 months, or sooner if needed ?

## 2021-12-10 ENCOUNTER — Encounter: Payer: Self-pay | Admitting: Internal Medicine

## 2021-12-10 DIAGNOSIS — M545 Low back pain, unspecified: Secondary | ICD-10-CM | POA: Insufficient documentation

## 2021-12-10 NOTE — Assessment & Plan Note (Signed)
Mild to mod seasonal flare, for otc allegra and nasacort asd,  to f/u any worsening symptoms or concerns ?

## 2021-12-10 NOTE — Assessment & Plan Note (Signed)
Age and sex appropriate education and counseling updated with regular exercise and diet Referrals for preventative services - pt to call for eye exam Immunizations addressed - none needed Smoking counseling  - none needed Evidence for depression or other mood disorder - none significant Most recent labs reviewed. I have personally reviewed and have noted: 1) the patient's medical and social history 2) The patient's current medications and supplements 3) The patient's height, weight, and BMI have been recorded in the chart  

## 2021-12-10 NOTE — Assessment & Plan Note (Signed)
Mild to mod acute, c/w msk strain, for muscle relaxer prn, also refer sport med for further consdieration per daughter reqeust ?

## 2021-12-10 NOTE — Assessment & Plan Note (Signed)
Mild recent, suspect geriatric decline, will continue to follow ?

## 2021-12-10 NOTE — Assessment & Plan Note (Signed)
Last vitamin D Lab Results  Component Value Date   VD25OH 56.97 12/08/2021   Stable, cont oral replacement  

## 2021-12-10 NOTE — Assessment & Plan Note (Signed)
Lab Results  ?Component Value Date  ? HGBA1C 5.6 12/08/2021  ? ?Stable, pt to continue current medical treatment  - diet ? ?

## 2021-12-14 ENCOUNTER — Ambulatory Visit: Payer: Medicare Other | Admitting: Sports Medicine

## 2021-12-15 ENCOUNTER — Other Ambulatory Visit: Payer: Self-pay | Admitting: Internal Medicine

## 2021-12-15 NOTE — Telephone Encounter (Signed)
Please refill as per office routine med refill policy (all routine meds to be refilled for 3 mo or monthly (per pt preference) up to one year from last visit, then month to month grace period for 3 mo, then further med refills will have to be denied) ? ?

## 2021-12-19 ENCOUNTER — Ambulatory Visit: Payer: Medicare Other | Admitting: Podiatry

## 2021-12-19 DIAGNOSIS — M79675 Pain in left toe(s): Secondary | ICD-10-CM | POA: Diagnosis not present

## 2021-12-19 DIAGNOSIS — M79674 Pain in right toe(s): Secondary | ICD-10-CM | POA: Diagnosis not present

## 2021-12-19 DIAGNOSIS — L84 Corns and callosities: Secondary | ICD-10-CM | POA: Diagnosis not present

## 2021-12-19 DIAGNOSIS — B351 Tinea unguium: Secondary | ICD-10-CM | POA: Diagnosis not present

## 2021-12-27 NOTE — Progress Notes (Signed)
Subjective:   Patient ID: Danielle Newman, female   DOB: 88 y.o.   MRN: [id number]   HPI 88 year old female presents the office today for concerns of thick, elongated toes that she not able to resolve as well as for calluses.  Denies any open lesions.  No swelling or redness that she reports.  She does describe some nerve pain.   Review of Systems  All other systems reviewed and are negative.  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; since 2008   CORONARY ARTERY DISEASE 02/11/2008   DEPRESSION 02/11/2008   DM (diabetes mellitus) (HCC) 11/21/2011   DYSPNEA 09/11/2010   GERD 02/11/2008   HYPERLIPIDEMIA 02/11/2008   HYPOTHYROIDISM 02/11/2008   Impaired glucose tolerance 11/20/2011   LUNG CANCER, HX OF 02/11/2008   MACULAR DEGENERATION 03/29/2009   OSTEOPENIA 02/11/2008   Sarcoidosis 02/11/2008   Sight impaired 04/05/2011    Past Surgical History:  Procedure Laterality Date   s/p arm surgury  05/29/1994   s/p left upper lobectomy       Current Outpatient Medications:    aspirin 81 MG tablet, Take 81 mg by mouth daily., Disp: , Rfl:    citalopram (CELEXA) 20 MG tablet, TAKE ONE TABLET BY MOUTH DAILY, Disp: 90 tablet, Rfl: 2   cyclobenzaprine (FLEXERIL) 5 MG tablet, Take 1 tablet (5 mg total) by mouth 3 (three) times daily as needed for muscle spasms., Disp: 40 tablet, Rfl: 2   diazepam (VALIUM) 10 MG tablet, Take 1 tablet (10 mg total) by mouth 2 (two) times daily as needed., Disp: 60 tablet, Rfl: 1   levothyroxine (SYNTHROID) 100 MCG tablet, TAKE ONE TABLET BY MOUTH DAILY, Disp: 90 tablet, Rfl: 3   lovastatin (MEVACOR) 40 MG tablet, TAKE ONE TABLET BY MOUTH DAILY, Disp: 90 tablet, Rfl: 3   neomycin-polymyxin b-dexamethasone (MAXITROL) 3.5-10000-0.1 OINT, , Disp: , Rfl:    pantoprazole (PROTONIX) 40 MG tablet, Take 1 tablet (40 mg total) by mouth 2 (two) times daily., Disp: 180 tablet, Rfl: 3    timolol (TIMOPTIC) 0.5 % ophthalmic solution, INSTILL 1 DROP IN EACH EYE TWO TIMES A DAY, Disp: 15 mL, Rfl: 12  Allergies  Allergen Reactions   Fioricet [Butalbital-Apap-Caffeine] Other (See Comments)    confusion   Meperidine Hcl          Objective:  Physical Exam  General: NAD  Dermatological: Nails are hypertrophic, dystrophic, brittle, discolored, elongated 10. No surrounding redness or drainage. Tenderness nails 1-5 bilaterally.  Hyperkeratotic lesions bilaterally.  No underlying ulceration drainage or any signs of infection noted.  Vascular: Dorsalis Pedis artery and Posterior Tibial artery pedal pulses are palpable bilateral with immedate capillary fill time.  There is no pain with calf compression, swelling, warmth, erythema.   Neruologic: Grossly intact via light touch bilateral.  Negative Tinel sign.  Musculoskeletal: No gross boney pedal deformities bilateral. No pain, crepitus, or limitation noted with foot and ankle range of motion bilateral. Muscular strength 5/5 in all groups tested bilateral.      Assessment:   Symptomatic onychomycosis, hyperkeratotic lesions     Plan:  -Treatment options discussed including all alternatives, risks, and complications -Etiology of symptoms were discussed -Nails debrided 10 without complications or bleeding. -Debrided hyperkeratotic lesions without any complications or bleeding. -Likely having some early neuropathy symptoms as she is describing some nerve pain to her feet.  Ultimately decided to hold off on any  further medications for now but will monitor. -Daily foot inspection -Follow-up in 3 months or sooner if any problems arise. In the meantime, encouraged to call the office with any questions, concerns, change in symptoms.   Ovid Curd, DPM

## 2022-01-01 ENCOUNTER — Ambulatory Visit: Payer: Medicare Other | Admitting: Family Medicine

## 2022-01-04 ENCOUNTER — Ambulatory Visit (INDEPENDENT_AMBULATORY_CARE_PROVIDER_SITE_OTHER): Payer: Medicare Other

## 2022-01-04 ENCOUNTER — Ambulatory Visit: Payer: Medicare Other | Admitting: Family Medicine

## 2022-01-04 VITALS — BP 148/88 | HR 72 | Ht 67.0 in

## 2022-01-04 DIAGNOSIS — M25551 Pain in right hip: Secondary | ICD-10-CM

## 2022-01-04 DIAGNOSIS — M545 Low back pain, unspecified: Secondary | ICD-10-CM

## 2022-01-04 DIAGNOSIS — G8929 Other chronic pain: Secondary | ICD-10-CM

## 2022-01-04 NOTE — Patient Instructions (Addendum)
Thank you for coming in today.   Please get an Xray today before you leave   I've referred you to Physical Therapy.  Let us know if you don't hear from them in one week.   Try using heat  Check back in 6 weeks  TENS UNIT: This is helpful for muscle pain and spasm.   Search and Purchase a TENS 7000 2nd edition at  www.tenspros.com or www.Amazon.com It should be less than $30.     TENS unit instructions: Do not shower or bathe with the unit on Turn the unit off before removing electrodes or batteries If the electrodes lose stickiness add a drop of water to the electrodes after they are disconnected from the unit and place on plastic sheet. If you continued to have difficulty, call the TENS unit company to purchase more electrodes. Do not apply lotion on the skin area prior to use. Make sure the skin is clean and dry as this will help prolong the life of the electrodes. After use, always check skin for unusual red areas, rash or other skin difficulties. If there are any skin problems, does not apply electrodes to the same area. Never remove the electrodes from the unit by pulling the wires. Do not use the TENS unit or electrodes other than as directed. Do not change electrode placement without consultating your therapist or physician. Keep 2 fingers with between each electrode. Wear time ratio is 2:1, on to off times.    For example on for 30 minutes off for 15 minutes and then on for 30 minutes off for 15 minutes

## 2022-01-04 NOTE — Progress Notes (Signed)
I, Philbert Riser, LAT, ATC acting as a scribe for Clementeen Graham, MD.  Subjective:    CC: Low back and R hip pain  HPI: Pt is an 88 y/o female c/o low back and R hip pain that has worsened over the last 2 weeks. Pt locates pain to the R-side of her low back, R buttock, with a knot just superior to the R buttock.   Radiating pain: no LE numbness/tingling: yes- R leg LE weakness: yes Aggravates: forward bending, transitioning to stand Treatments tried: piriformis stretches, motrin, laying supine  Pertinent review of Systems: No fevers or chills  Relevant historical information: Diabetes.  Legally blind.   Objective:    Vitals:   01/04/22 1525  BP: (!) 148/88  Pulse: 72  SpO2: 98%   General: Well Developed, well nourished, and in no acute distress.   MSK: L-spine: Nontender midline. Decreased lumbar motion. Lower extremity strength is intact except foot dorsiflexion bilaterally which is decreased 4/5.  Otherwise lower extremity strength is intact bilaterally. Reflexes are intact. Normal gait. Sensation is intact throughout.  Right hip: Normal. Tender palpation at superior posterior iliac crest Normal hip motion.  Hip abduction strength generally intact.   Lab and Radiology Results  X-ray images of the lumbar spine and right hip obtained today personally and independently interpreted.  Lumbar spine: Diffuse lumbar degenerative changes.  Aortic atherosclerosis present.  No acute fractures or present.  Right hip: Enthesiopathy changes of the greater trochanter. Mild hip DJD.  No acute fractures are present.  Await formal radiology review   Impression and Recommendations:    Assessment and Plan: 88 y.o. female with right low back pain.  This is an acute exacerbation of a chronic issue.  She is a good candidate for trial of physical therapy.  She is legally blind and does not drive.  Will use home health physical therapy at first.  If this does not work we  probably could use outpatient physical therapy in [place] but this would be much more challenging logistically.  Recommend also heating pad and a TENS unit.  Check back in 6 weeks.  PDMP not reviewed this encounter. Orders Placed This Encounter  Procedures   DG Lumbar Spine 2-3 Views    Standing Status:   Future    Number of Occurrences:   1    Standing Expiration Date:   02/03/2022    Order Specific Question:   Reason for Exam (SYMPTOM  OR DIAGNOSIS REQUIRED)    Answer:   low back pain    Order Specific Question:   Preferred imaging location?    Answer:   [facility]   DG HIP UNILAT W OR W/O PELVIS 2-3 VIEWS RIGHT    Standing Status:   Future    Number of Occurrences:   1    Standing Expiration Date:   02/03/2022    Order Specific Question:   Reason for Exam (SYMPTOM  OR DIAGNOSIS REQUIRED)    Answer:   right hip pain    Order Specific Question:   Preferred imaging location?    Answer:   [facility]   Ambulatory referral to Home Health    Referral Priority:   Routine    Referral Type:   Home Health Care    Referral Reason:   Specialty Services Required    Requested Specialty:   Home Health Services    Number of Visits Requested:   1   No orders of the defined types  were placed in this encounter.   Discussed warning signs or symptoms. Please see discharge instructions. Patient expresses understanding.   The above documentation has been reviewed and is accurate and complete Lynne Leader, M.D.

## 2022-01-08 NOTE — Progress Notes (Signed)
Lumbar spine x-ray shows some arthritis changes.

## 2022-01-08 NOTE — Progress Notes (Signed)
Right hip x-ray shows medium arthritis changes.

## 2022-01-11 DIAGNOSIS — F32A Depression, unspecified: Secondary | ICD-10-CM | POA: Diagnosis not present

## 2022-01-11 DIAGNOSIS — M47816 Spondylosis without myelopathy or radiculopathy, lumbar region: Secondary | ICD-10-CM | POA: Diagnosis not present

## 2022-01-11 DIAGNOSIS — M1611 Unilateral primary osteoarthritis, right hip: Secondary | ICD-10-CM | POA: Diagnosis not present

## 2022-01-11 DIAGNOSIS — E1122 Type 2 diabetes mellitus with diabetic chronic kidney disease: Secondary | ICD-10-CM | POA: Diagnosis not present

## 2022-01-11 DIAGNOSIS — N183 Chronic kidney disease, stage 3 unspecified: Secondary | ICD-10-CM | POA: Diagnosis not present

## 2022-01-16 DIAGNOSIS — E1122 Type 2 diabetes mellitus with diabetic chronic kidney disease: Secondary | ICD-10-CM | POA: Diagnosis not present

## 2022-01-16 DIAGNOSIS — F32A Depression, unspecified: Secondary | ICD-10-CM | POA: Diagnosis not present

## 2022-01-16 DIAGNOSIS — M1611 Unilateral primary osteoarthritis, right hip: Secondary | ICD-10-CM | POA: Diagnosis not present

## 2022-01-16 DIAGNOSIS — M47816 Spondylosis without myelopathy or radiculopathy, lumbar region: Secondary | ICD-10-CM | POA: Diagnosis not present

## 2022-01-16 DIAGNOSIS — N183 Chronic kidney disease, stage 3 unspecified: Secondary | ICD-10-CM | POA: Diagnosis not present

## 2022-01-18 DIAGNOSIS — M1611 Unilateral primary osteoarthritis, right hip: Secondary | ICD-10-CM | POA: Diagnosis not present

## 2022-01-18 DIAGNOSIS — N183 Chronic kidney disease, stage 3 unspecified: Secondary | ICD-10-CM | POA: Diagnosis not present

## 2022-01-18 DIAGNOSIS — F32A Depression, unspecified: Secondary | ICD-10-CM | POA: Diagnosis not present

## 2022-01-18 DIAGNOSIS — M47816 Spondylosis without myelopathy or radiculopathy, lumbar region: Secondary | ICD-10-CM | POA: Diagnosis not present

## 2022-01-18 DIAGNOSIS — E1122 Type 2 diabetes mellitus with diabetic chronic kidney disease: Secondary | ICD-10-CM | POA: Diagnosis not present

## 2022-01-22 DIAGNOSIS — M1611 Unilateral primary osteoarthritis, right hip: Secondary | ICD-10-CM | POA: Diagnosis not present

## 2022-01-22 DIAGNOSIS — N183 Chronic kidney disease, stage 3 unspecified: Secondary | ICD-10-CM | POA: Diagnosis not present

## 2022-01-22 DIAGNOSIS — F32A Depression, unspecified: Secondary | ICD-10-CM | POA: Diagnosis not present

## 2022-01-22 DIAGNOSIS — M47816 Spondylosis without myelopathy or radiculopathy, lumbar region: Secondary | ICD-10-CM | POA: Diagnosis not present

## 2022-01-22 DIAGNOSIS — E1122 Type 2 diabetes mellitus with diabetic chronic kidney disease: Secondary | ICD-10-CM | POA: Diagnosis not present

## 2022-01-23 ENCOUNTER — Telehealth: Payer: Self-pay | Admitting: Family Medicine

## 2022-01-23 DIAGNOSIS — M2170 Unequal limb length (acquired), unspecified site: Secondary | ICD-10-CM

## 2022-01-23 NOTE — Telephone Encounter (Signed)
[facility] Called asking for [place] to fax an order to [facility] for custom shoe lifts to help with a leg length discrepancy.  Fax # [phone]

## 2022-01-24 MED ORDER — AMBULATORY NON FORMULARY MEDICATION: 0 refills | Status: AC

## 2022-01-24 NOTE — Telephone Encounter (Signed)
We will fax order today.

## 2022-01-25 DIAGNOSIS — N183 Chronic kidney disease, stage 3 unspecified: Secondary | ICD-10-CM | POA: Diagnosis not present

## 2022-01-25 DIAGNOSIS — F32A Depression, unspecified: Secondary | ICD-10-CM | POA: Diagnosis not present

## 2022-01-25 DIAGNOSIS — M1611 Unilateral primary osteoarthritis, right hip: Secondary | ICD-10-CM | POA: Diagnosis not present

## 2022-01-25 DIAGNOSIS — E1122 Type 2 diabetes mellitus with diabetic chronic kidney disease: Secondary | ICD-10-CM | POA: Diagnosis not present

## 2022-01-25 DIAGNOSIS — M47816 Spondylosis without myelopathy or radiculopathy, lumbar region: Secondary | ICD-10-CM | POA: Diagnosis not present

## 2022-01-29 DIAGNOSIS — F32A Depression, unspecified: Secondary | ICD-10-CM | POA: Diagnosis not present

## 2022-01-29 DIAGNOSIS — M47816 Spondylosis without myelopathy or radiculopathy, lumbar region: Secondary | ICD-10-CM | POA: Diagnosis not present

## 2022-01-29 DIAGNOSIS — N183 Chronic kidney disease, stage 3 unspecified: Secondary | ICD-10-CM | POA: Diagnosis not present

## 2022-01-29 DIAGNOSIS — M1611 Unilateral primary osteoarthritis, right hip: Secondary | ICD-10-CM | POA: Diagnosis not present

## 2022-01-29 DIAGNOSIS — E1122 Type 2 diabetes mellitus with diabetic chronic kidney disease: Secondary | ICD-10-CM | POA: Diagnosis not present

## 2022-02-01 DIAGNOSIS — E1122 Type 2 diabetes mellitus with diabetic chronic kidney disease: Secondary | ICD-10-CM | POA: Diagnosis not present

## 2022-02-01 DIAGNOSIS — N183 Chronic kidney disease, stage 3 unspecified: Secondary | ICD-10-CM | POA: Diagnosis not present

## 2022-02-01 DIAGNOSIS — M47816 Spondylosis without myelopathy or radiculopathy, lumbar region: Secondary | ICD-10-CM | POA: Diagnosis not present

## 2022-02-01 DIAGNOSIS — M1611 Unilateral primary osteoarthritis, right hip: Secondary | ICD-10-CM | POA: Diagnosis not present

## 2022-02-01 DIAGNOSIS — F32A Depression, unspecified: Secondary | ICD-10-CM | POA: Diagnosis not present

## 2022-02-06 DIAGNOSIS — M1611 Unilateral primary osteoarthritis, right hip: Secondary | ICD-10-CM | POA: Diagnosis not present

## 2022-02-06 DIAGNOSIS — M47816 Spondylosis without myelopathy or radiculopathy, lumbar region: Secondary | ICD-10-CM | POA: Diagnosis not present

## 2022-02-06 DIAGNOSIS — E1122 Type 2 diabetes mellitus with diabetic chronic kidney disease: Secondary | ICD-10-CM | POA: Diagnosis not present

## 2022-02-06 DIAGNOSIS — N183 Chronic kidney disease, stage 3 unspecified: Secondary | ICD-10-CM | POA: Diagnosis not present

## 2022-02-06 DIAGNOSIS — F32A Depression, unspecified: Secondary | ICD-10-CM | POA: Diagnosis not present

## 2022-02-12 ENCOUNTER — Ambulatory Visit: Payer: Medicare Other | Admitting: Family Medicine

## 2022-02-12 DIAGNOSIS — M47816 Spondylosis without myelopathy or radiculopathy, lumbar region: Secondary | ICD-10-CM | POA: Diagnosis not present

## 2022-02-12 DIAGNOSIS — E1122 Type 2 diabetes mellitus with diabetic chronic kidney disease: Secondary | ICD-10-CM | POA: Diagnosis not present

## 2022-02-12 DIAGNOSIS — N183 Chronic kidney disease, stage 3 unspecified: Secondary | ICD-10-CM | POA: Diagnosis not present

## 2022-02-12 DIAGNOSIS — F32A Depression, unspecified: Secondary | ICD-10-CM | POA: Diagnosis not present

## 2022-02-12 DIAGNOSIS — M1611 Unilateral primary osteoarthritis, right hip: Secondary | ICD-10-CM | POA: Diagnosis not present

## 2022-02-14 ENCOUNTER — Ambulatory Visit: Payer: Medicare Other | Admitting: Family Medicine

## 2022-02-19 ENCOUNTER — Encounter: Payer: Self-pay | Admitting: Family Medicine

## 2022-02-19 ENCOUNTER — Ambulatory Visit: Payer: Medicare Other | Admitting: Family Medicine

## 2022-02-19 VITALS — BP 140/80 | HR 75 | Ht 67.0 in | Wt 165.2 lb

## 2022-02-19 DIAGNOSIS — M5416 Radiculopathy, lumbar region: Secondary | ICD-10-CM | POA: Diagnosis not present

## 2022-02-19 MED ORDER — LORAZEPAM 0.5 MG PO TABS
ORAL_TABLET | ORAL | 0 refills | Status: DC
Start: 2022-02-19 — End: 2022-06-11

## 2022-02-19 NOTE — Progress Notes (Unsigned)
I, Christoper Fabian, LAT, ATC, am serving as scribe for Dr. Clementeen Graham.  Danielle Newman is a 88 y.o. female who presents to [facility] at [facility] today for f/u R hip and R-side low back pain. Pt was last seen by Dr. Denyse Amass on 01/04/22 and was referred to home health PT and advised to use a heating pad and TENS unit. An order was also placed for custom orthotics to correct her leg length discrepancy. Today, pt reports that she's not doing well.  She has taken a lot of motrin and a muscle relaxer.  She locates her pain to her hip/buttock and has numbness in her R ant thigh and R medial lower leg. She notes that she feels like she has a knot in her R lower back and buttock.  She has completed home health PT and has been doing her HEP and found home health PT to be very helpful.  Dx imaging: 01/04/22 R hip & L-spine XR  Pertinent review of systems: No fevers or chills  Relevant historical information: Migraine history.  Diabetes.   Exam:  BP 140/80 (BP Location: Left Arm, Patient Position: Sitting, Cuff Size: Normal)   Pulse 75   Ht 5\' 7"  (1.702 m)   Wt 165 lb 3.2 oz (74.9 kg)   SpO2 99%   BMI 25.87 kg/m  General: Well Developed, well nourished, and in no acute distress.   MSK: L-spine: Nontender midline.  Tender palpation right lumbar paraspinal musculature. Decreased lumbar motion. Positive right-sided slump test. Reflexes are intact.    Lab and Radiology Results  DG HIP UNILAT W OR W/O PELVIS 2-3 VIEWS RIGHT  Result Date: 01/06/2022 CLINICAL DATA:  Right hip pain EXAM: DG HIP (WITH OR WITHOUT PELVIS) 2-3V RIGHT COMPARISON:  None Available. FINDINGS: Frontal view of the pelvis as well as frontal and frogleg lateral views of the right hip are obtained. There is moderate bilateral symmetrical hip osteoarthritis. No acute fracture, subluxation, or dislocation within either hip. Sacroiliac joints are unremarkable. There is moderate spondylosis at the lumbosacral  junction. IMPRESSION: 1. Moderate symmetrical bilateral hip osteoarthritis. No acute bony abnormality. 2. Lower lumbar spondylosis. Electronically Signed   By: 03/08/2022 M.D.   On: 01/06/2022 13:44   DG Lumbar Spine 2-3 Views  Result Date: 01/05/2022 CLINICAL DATA:  Low back pain EXAM: LUMBAR SPINE - 2-3 VIEW COMPARISON:  None Available. FINDINGS: Lumbar vertebral body height height are preserved without evidence of fracture. Minimal grade 1 anterolisthesis of L4 on L5. Mild intervertebral disc space narrowing throughout the lumbar spine. Facet arthropathy most significant in the lower lumbar spine. Severe calcified atherosclerotic plaques noted in the aorta. IMPRESSION: Degenerative changes with no acute fracture identified. Electronically Signed   By: 03/07/2022 M.D.   On: 01/05/2022 13:14   I, 03/07/2022, personally (independently) visualized and performed the interpretation of the images attached in this note.     Assessment and Plan: 89 y.o. female with right low back pain and right leg pain thought to be exacerbation of chronic low back pain and lumbar radiculopathy likely L5 perhaps L4.  Plan for MRI to further characterize radicular pain and for potential epidural steroid injection planning.  Recheck after MRI to go over the results fully and discuss treatment plan options including epidural steroid injection.  She also mentions that she is interested in trying some osteopathic manipulation which is reasonable.  She can schedule with Dr. 98 or Dr. Katrinka Blazing.    PDMP  reviewed during this encounter. Orders Placed This Encounter  Procedures   MR Lumbar Spine Wo Contrast    Standing Status:   Future    Standing Expiration Date:   02/20/2023    Order Specific Question:   What is the patient's sedation requirement?    Answer:   Oral Sedation    Order Specific Question:   Does the patient have a pacemaker or implanted devices?    Answer:   No    Order Specific Question:    Preferred imaging location?    Answer:   [facility] (table limit-350lbs)   Meds ordered this encounter  Medications   LORazepam (ATIVAN) 0.5 MG tablet    Sig: 1-2 tabs 30 - 60 min prior to MRI. Do not drive with this medicine.    Dispense:  4 tablet    Refill:  0     Discussed warning signs or symptoms. Please see discharge instructions. Patient expresses understanding.   The above documentation has been reviewed and is accurate and complete Clementeen Graham, M.D.

## 2022-02-19 NOTE — Patient Instructions (Addendum)
Good to see you today.  Rt leg is shorter.   Heel left right foot.   You should hear from MRI scheduling within 1 week. If you do not hear please let me know.    Osteopathic manipulation with Jean Rosenthal or Katrinka Blazing   Keep up the good work w/ your home exercises.  Follow-up: after MRI and Manipulation

## 2022-02-23 DIAGNOSIS — E1122 Type 2 diabetes mellitus with diabetic chronic kidney disease: Secondary | ICD-10-CM | POA: Diagnosis not present

## 2022-02-23 DIAGNOSIS — F32A Depression, unspecified: Secondary | ICD-10-CM | POA: Diagnosis not present

## 2022-02-23 DIAGNOSIS — M47816 Spondylosis without myelopathy or radiculopathy, lumbar region: Secondary | ICD-10-CM | POA: Diagnosis not present

## 2022-02-23 DIAGNOSIS — M1611 Unilateral primary osteoarthritis, right hip: Secondary | ICD-10-CM | POA: Diagnosis not present

## 2022-02-23 DIAGNOSIS — N183 Chronic kidney disease, stage 3 unspecified: Secondary | ICD-10-CM | POA: Diagnosis not present

## 2022-02-28 ENCOUNTER — Other Ambulatory Visit (INDEPENDENT_AMBULATORY_CARE_PROVIDER_SITE_OTHER): Payer: Self-pay | Admitting: Ophthalmology

## 2022-03-10 ENCOUNTER — Ambulatory Visit (INDEPENDENT_AMBULATORY_CARE_PROVIDER_SITE_OTHER): Payer: Medicare Other

## 2022-03-10 DIAGNOSIS — M5416 Radiculopathy, lumbar region: Secondary | ICD-10-CM | POA: Diagnosis not present

## 2022-03-10 DIAGNOSIS — M5136 Other intervertebral disc degeneration, lumbar region: Secondary | ICD-10-CM | POA: Diagnosis not present

## 2022-03-13 NOTE — Progress Notes (Signed)
Lumbar spine MRI shows severe spinal stenosis at L4-5.  This is very likely causing your pinched nerve and leg pain and may be causing some of your back pain as well.  You also have some arthritis changes in your back that could be producing the back pain as well.  At this point we should proceed to cortisone shots in your back.  Recommend return to clinic to go over the results in full detail and talk about next steps including injections.

## 2022-03-26 ENCOUNTER — Ambulatory Visit: Payer: Medicare Other | Admitting: Podiatry

## 2022-05-21 ENCOUNTER — Telehealth: Payer: Self-pay | Admitting: Internal Medicine

## 2022-05-21 NOTE — Telephone Encounter (Signed)
Left message for patient to call back to schedule Medicare Annual Wellness Visit   No hx of AWV   Please schedule at anytime with [facility] Advisor if patient calls the office back.     Any questions, please call me at [phone]  

## 2022-06-06 ENCOUNTER — Telehealth: Payer: Self-pay | Admitting: Internal Medicine

## 2022-06-06 NOTE — Telephone Encounter (Signed)
LVM for pt to rtn my call to schedule AWV with [facility] call back # [phone] 

## 2022-06-11 ENCOUNTER — Ambulatory Visit: Payer: Medicare Other | Admitting: Family Medicine

## 2022-06-11 ENCOUNTER — Ambulatory Visit (INDEPENDENT_AMBULATORY_CARE_PROVIDER_SITE_OTHER): Payer: Medicare Other | Admitting: Internal Medicine

## 2022-06-11 ENCOUNTER — Encounter: Payer: Self-pay | Admitting: Internal Medicine

## 2022-06-11 VITALS — BP 106/58 | HR 75 | Ht 67.0 in

## 2022-06-11 VITALS — BP 116/60 | HR 82 | Temp 98.1°F | Ht 67.0 in | Wt 161.5 lb

## 2022-06-11 DIAGNOSIS — E1165 Type 2 diabetes mellitus with hyperglycemia: Secondary | ICD-10-CM | POA: Diagnosis not present

## 2022-06-11 DIAGNOSIS — E7800 Pure hypercholesterolemia, unspecified: Secondary | ICD-10-CM | POA: Diagnosis not present

## 2022-06-11 DIAGNOSIS — Z23 Encounter for immunization: Secondary | ICD-10-CM | POA: Diagnosis not present

## 2022-06-11 DIAGNOSIS — E559 Vitamin D deficiency, unspecified: Secondary | ICD-10-CM | POA: Diagnosis not present

## 2022-06-11 DIAGNOSIS — M5416 Radiculopathy, lumbar region: Secondary | ICD-10-CM

## 2022-06-11 DIAGNOSIS — E039 Hypothyroidism, unspecified: Secondary | ICD-10-CM

## 2022-06-11 NOTE — Assessment & Plan Note (Signed)
Lab Results  Component Value Date   LDLCALC 73 10/31/2020   Uncontrolled, goal ldl < 70, pt to continue current statin lovastatin 40 mg qd, declines other change or addition

## 2022-06-11 NOTE — Progress Notes (Unsigned)
I, Peterson Lombard, LAT, ATC acting as a scribe for Lynne Leader, MD.  Danielle Newman is a 88 y.o. female who presents to [place] at [facility] today for f/u R hip and R-side low back pain and lumbar MRI review. Pt was last seen by Dr. Georgina Snell on 02/19/22 and was advised to proceed to MRI and to schedule for OMT w/ Dr. Glennon Mac or Dr. Tamala Julian. Today, pt reports R hip and low back pain are feeling a lot better. Pt has been working on [employer] and she really benefited from home health PT. She notes she's made some lifestyle modifications w/ her ADL's that has really helped. She is using a transfer bench for the bathtub and sock assist.  Overall things are going well.   Dx imaging: 03/10/22 L-spine MRI 01/04/22 R hip & L-spine XR  Pertinent review of systems: No fevers or chills  Relevant historical information: Diabetes.  Legally blind.  Sarcoidosis.  History of lung cancer.   Exam:  BP (!) 106/58   Pulse 75   Ht 5\' 7"  (1.702 m)   SpO2 99%   BMI 25.87 kg/m  General: Well Developed, well nourished, and in no acute distress.   MSK: L-spine nontender midline decreased lumbar motion.  Lower extremity strength is intact.    Lab and Radiology Results  EXAM: MRI LUMBAR SPINE WITHOUT CONTRAST   TECHNIQUE: Multiplanar, multisequence MR imaging of the lumbar spine was performed. No intravenous contrast was administered.   COMPARISON:  Radiograph January 04, 2022.   FINDINGS: Segmentation: 5 lumbar type vertebrae. Rudimentary disc seen at S1-2.   Alignment:  Straightening of the lumbar lordosis.   Vertebrae:  No fracture, evidence of discitis, or bone lesion.   Conus medullaris and cauda equina: Conus extends to the L1-2 level. Conus and cauda equina appear normal.   Paraspinal and other soft tissues: Bilateral renal cyst. No follow-up imaging is recommended.   [employer] 2021; [id number], Bosniak Classification of Cystic Renal Masses, Version 2019.   Disc levels:    T12-L1: Shallow disc bulge and moderate facet degenerative changes, left greater than right, without significant spinal canal or neural foraminal stenosis.   L1-2: Shallow disc bulge and moderate facet degenerative changes resulting in mild left neural foraminal narrowing. No significant spinal canal stenosis.   L2-3: Disc bulge, moderate facet degenerative changes and ligamentum flavum redundancy resulting in mild spinal canal stenosis with mild narrowing of the left subarticular zone. No significant neural foraminal narrowing.   L3-4: Disc bulge, moderate facet degenerative changes resulting in mild-to-moderate spinal canal stenosis with moderate narrowing of the bilateral subarticular zones and mild right neural foraminal narrowing.   L4-5: Disc bulge, prominent hypertrophic facet degenerative changes and ligamentum flavum redundancy resulting in severe spinal canal stenosis, mild right and moderate left neural foraminal narrowing.   L5-S1: Disc bulge with moderate hypertrophic facet degenerative changes and ligamentum flavum redundancy resulting in mild spinal canal stenosis with prominent narrowing of the bilateral subarticular zones and moderate bilateral neural foraminal narrowing.   IMPRESSION: Degenerative changes of the lumbar spine, as detailed above, more pronounced at L4-5 where there is severe spinal canal stenosis, mild right and moderate left neural foraminal narrowing.     Electronically Signed   By: Pedro Earls M.D.   On: 03/12/2022 11:38 I, Lynne Leader, personally (independently) visualized and performed the interpretation of the images attached in this note.      Assessment and Plan: 88 y.o. female with improved  back pain and radicular pain.  She has benefited significantly from home health physical therapy.  She continues a home exercise program on her own.  We certainly could reauthorize that if needed in the future.  Additionally I  could proceed to an epidural steroid injection.  She has areas of narrowing most specifically L4-5 which would be a good target.  Recheck back as needed. Total encounter time 20 minutes including face-to-face time with the patient and, reviewing past medical record, and charting on the date of service.       Discussed warning signs or symptoms. Please see discharge instructions. Patient expresses understanding.   The above documentation has been reviewed and is accurate and complete Clementeen Graham, M.D.

## 2022-06-11 NOTE — Assessment & Plan Note (Signed)
Lab Results  Component Value Date   TSH 0.78 12/08/2021   Stable, pt to continue levothyroxine 100 mcg qd

## 2022-06-11 NOTE — Patient Instructions (Signed)
Thank you for coming in today.   I can order PT again.   I can get set up for back injections.   Recheck as needed.

## 2022-06-11 NOTE — Assessment & Plan Note (Signed)
Last vitamin D Lab Results  Component Value Date   VD25OH 56.97 12/08/2021   Stable, cont oral replacement

## 2022-06-11 NOTE — Assessment & Plan Note (Signed)
Lab Results  Component Value Date   HGBA1C 5.6 12/08/2021   Stable, pt to continue current medical treatment - diet, wt control, excercise - no OHA needed

## 2022-06-11 NOTE — Progress Notes (Signed)
Patient ID: KARESHA TRZCINSKI, female   DOB: [birth year], 88 y.o.   MRN: [id number]        Chief Complaint: follow up HLD and DM, low thyroid, low vit d with daughter who provides excellent support       HPI:  NIAOMI CARTAYA is a 88 y.o. female here overall doing ok,  Pt denies chest pain, increased sob or doe, wheezing, orthopnea, PND, increased LE swelling, palpitations, dizziness or syncope.   Pt denies polydipsia, polyuria, or new focal neuro s/s.    Pt denies fever, wt loss, night sweats, loss of appetite, or other constitutional symptoms  Denies hyper or hypo thyroid symptoms such as voice, skin or hair change.  Lost wt with more active since right sciatia now resolved after MRI and PT.  Due for flu shot.   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 Her smoking use included cigarettes. She smoked an average of 2 packs per day. She has never used smokeless tobacco. She reports that she does not drink alcohol and does not use drugs. family history includes Allergies in her daughter;  Cancer in her daughter and mother. Allergies  Allergen Reactions   Fioricet [Butalbital-Apap-Caffeine] Other (See Comments)    confusion   Meperidine Hcl    Current Outpatient Medications on File Prior to Visit  Medication Sig Dispense Refill   AMBULATORY NON FORMULARY MEDICATION Custom shoe lifts to treat acquired leg length discrepancy. [employer]   Fax # [phone] 1 each 0   aspirin 81 MG tablet Take 81 mg by mouth daily.     citalopram (CELEXA) 20 MG tablet TAKE ONE TABLET BY MOUTH DAILY 90 tablet 2   cyclobenzaprine (FLEXERIL) 5 MG tablet Take 1 tablet (5 mg total) by mouth 3 (three) times daily as needed for muscle spasms. 40 tablet 2   diazepam (VALIUM) 10 MG tablet Take 1 tablet (10 mg total) by mouth 2 (two) times daily as needed. 60 tablet 1   levothyroxine (SYNTHROID) 100 MCG tablet TAKE ONE TABLET BY MOUTH DAILY 90 tablet 3   lovastatin (MEVACOR) 40 MG tablet TAKE ONE TABLET BY MOUTH DAILY 90 tablet 3   neomycin-polymyxin b-dexamethasone (MAXITROL) 3.5-10000-0.1 OINT      pantoprazole (PROTONIX) 40 MG tablet Take 1 tablet (40 mg total) by mouth 2 (two) times daily. 180 tablet 3   timolol (TIMOPTIC) 0.5 % ophthalmic solution INSTILL ONE DROP INTO EACH EYE TWO TIMES  A DAY 15 mL 12   No current facility-administered medications on file prior to visit.        ROS:  All others reviewed and negative.  Objective        PE:  BP 116/60   Pulse 82   Temp 98.1 F (36.7 C) (Oral)   Ht 5\' 7"  (1.702 m)   Wt 161 lb 8 oz (73.3 kg)   SpO2 92%   BMI 25.29 kg/m                 Constitutional: Pt appears in NAD               HENT: Head: NCAT.                Right Ear: External ear normal.                 Left Ear: External ear normal.                Eyes: . Pupils are equal, round, and reactive to light. Conjunctivae and EOM are normal               Nose: without d/c or deformity               Neck: Neck supple. Gross normal ROM               Cardiovascular: Normal rate  and regular rhythm.                 Pulmonary/Chest: Effort normal and breath sounds without rales or wheezing.                Abd:  Soft, NT, ND, + BS, no organomegaly               Neurological: Pt is alert. At baseline orientation, motor grossly intact               Skin: Skin is warm. No rashes, no other new lesions, LE edema - none               Psychiatric: Pt behavior is normal without agitation   Micro: none  Cardiac tracings I have personally interpreted today:  none  Pertinent Radiological findings (summarize): none   Lab Results  Component Value Date   WBC 9.4 12/08/2021   HGB 12.9 12/08/2021   HCT 38.3 12/08/2021   PLT 318.0 12/08/2021   GLUCOSE 106 (H) 12/08/2021   CHOL 167 12/08/2021   TRIG 252.0 (H) 12/08/2021   HDL 37.60 (L) 12/08/2021   LDLDIRECT 95.0 12/08/2021   LDLCALC 73 10/31/2020   ALT 12 12/08/2021   AST 14 12/08/2021   NA 140 12/08/2021   K 5.1 12/08/2021   CL 102 12/08/2021   CREATININE 1.05 12/08/2021   BUN 21 12/08/2021   CO2 33 (H) 12/08/2021   TSH 0.78 12/08/2021   HGBA1C 5.6 12/08/2021   MICROALBUR <0.7 12/08/2021   Assessment/Plan:  ATHLEEN FELTNER is a 88 y.o. White or Caucasian [1] female with  has a past medical history of ALLERGIC RHINITIS (02/11/2008), ANXIETY (02/11/2008), Blind right eye (11/20/2011), CORONARY ARTERY DISEASE (02/11/2008), DEPRESSION (02/11/2008), DM (diabetes mellitus) (HCC) (11/21/2011), DYSPNEA (09/11/2010), GERD (02/11/2008), HYPERLIPIDEMIA (02/11/2008), HYPOTHYROIDISM (02/11/2008), Impaired glucose tolerance (11/20/2011), LUNG CANCER, HX OF (02/11/2008), MACULAR DEGENERATION (03/29/2009), OSTEOPENIA (02/11/2008), Sarcoidosis (02/11/2008), and Sight impaired (04/05/2011).  Vitamin D deficiency Last vitamin D Lab Results  Component Value Date   VD25OH 56.97 12/08/2021  Stable, cont oral replacement   Hyperlipidemia Lab Results  Component Value Date   LDLCALC 73 10/31/2020   Uncontrolled, goal ldl < 70, pt to continue  current statin lovastatin 40 mg qd, declines other change or addition  DM (diabetes mellitus) Lab Results  Component Value Date   HGBA1C 5.6 12/08/2021   Stable, pt to continue current medical treatment - diet, wt control, excercise - no OHA needed   Hypothyroidism Lab Results  Component Value Date   TSH 0.78 12/08/2021   Stable, pt to continue levothyroxine 100 mcg qd  Followup: No follow-ups on file.  Oliver Barre, MD 06/11/2022 8:10 PM [facility]

## 2022-06-11 NOTE — Patient Instructions (Addendum)
You had the flu shot today  Please have your Shingrix (shingles) shots done at your local pharmacy.  Please continue all other medications as before, and refills have been done if requested.  Please have the pharmacy call with any other refills you may need.  Please continue your efforts at being more active, low cholesterol diet, and weight control  Please keep your appointments with your specialists as you may have planned  Please make an Appointment to return in 6 months, or sooner if needed

## 2022-06-29 ENCOUNTER — Ambulatory Visit: Payer: Medicare Other | Admitting: Internal Medicine

## 2022-07-08 ENCOUNTER — Other Ambulatory Visit: Payer: Self-pay | Admitting: Internal Medicine

## 2022-07-08 NOTE — Telephone Encounter (Signed)
Please refill as per office routine med refill policy (all routine meds to be refilled for 3 mo or monthly (per pt preference) up to one year from last visit, then month to month grace period for 3 mo, then further med refills will have to be denied) ? ?

## 2022-08-25 IMAGING — DX DG LUMBAR SPINE 2-3V
3 series · 3 of 3 positions shown · non-contrast
Comparison: None Available.

CLINICAL DATA: Low back pain

EXAM:
LUMBAR SPINE - 2-3 VIEW

[l-spine ap]
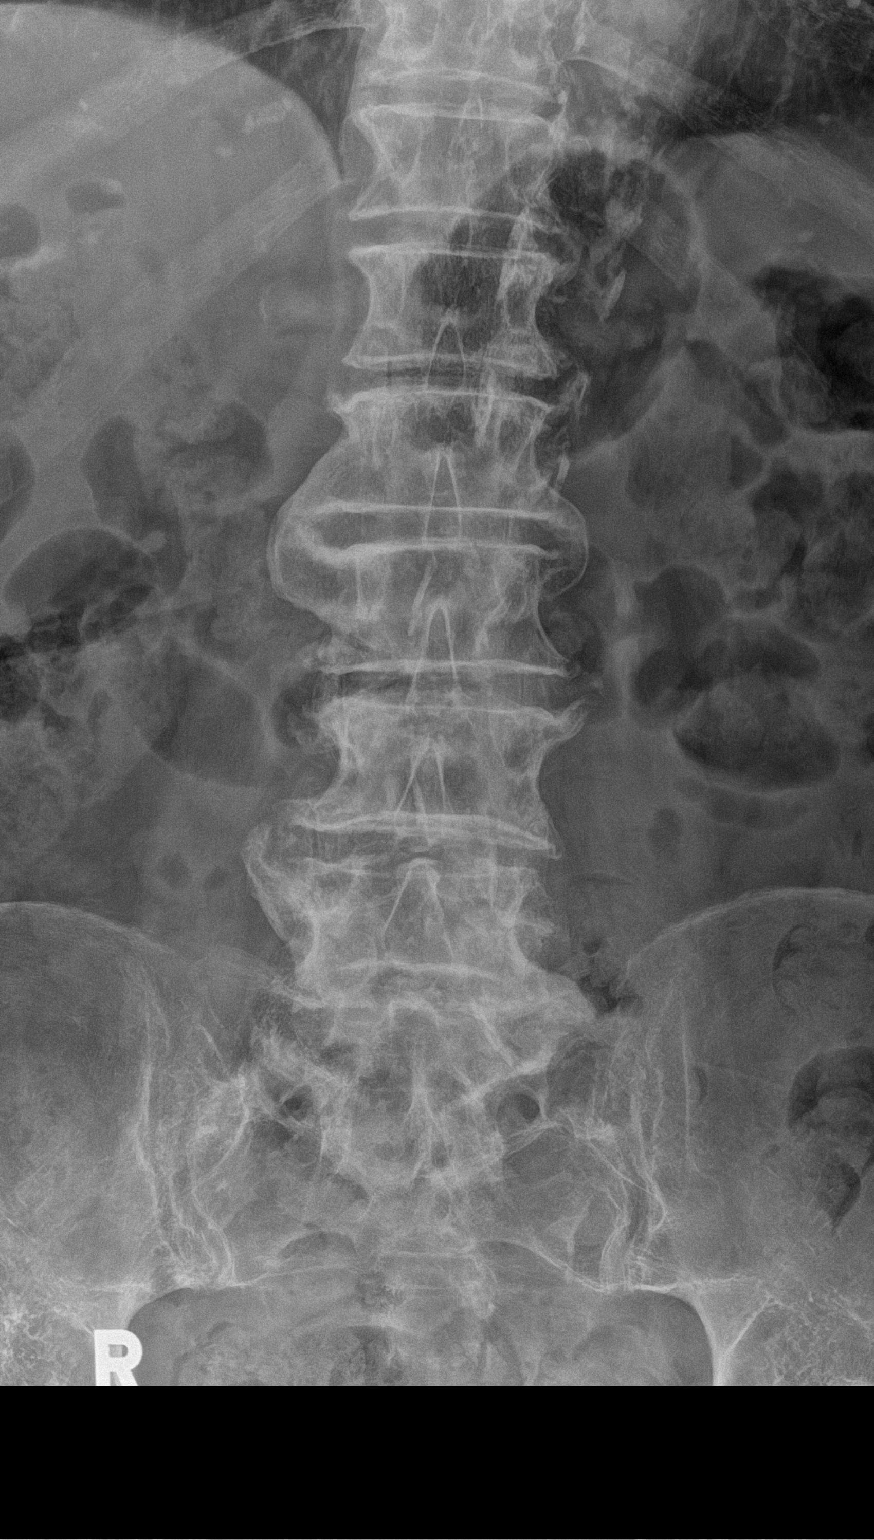

[l-spine lateral]
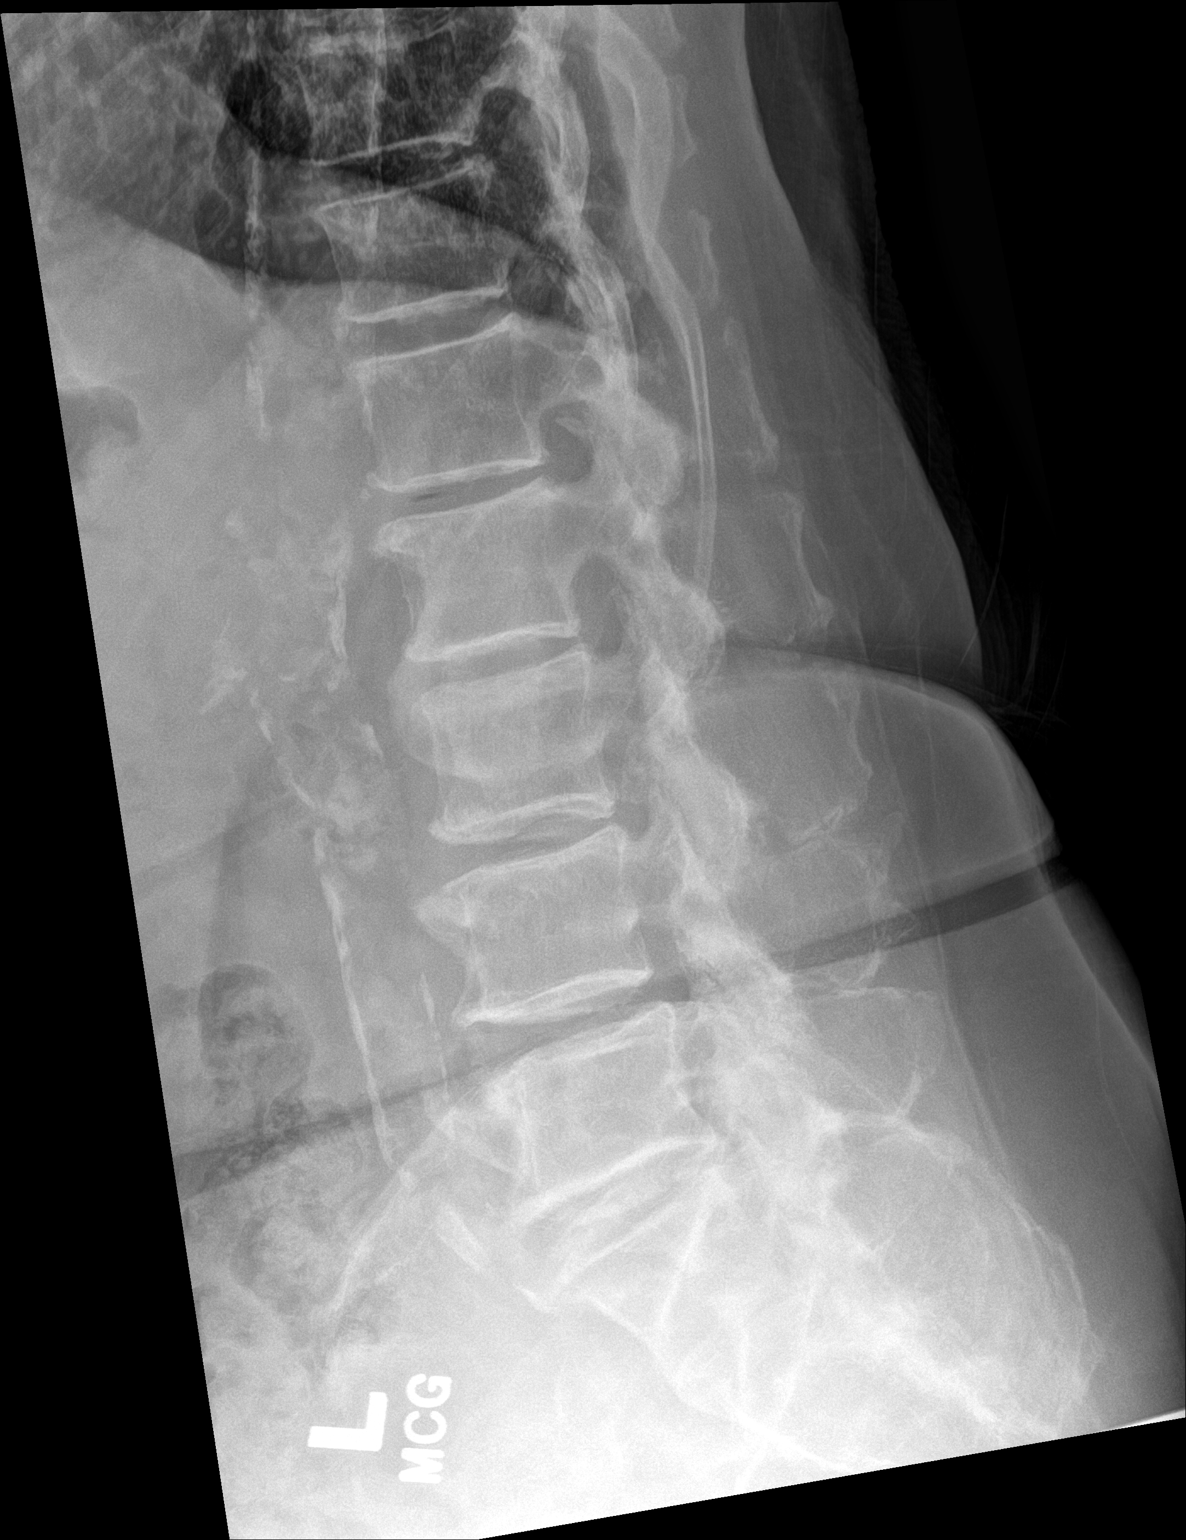

[l-spine spot]
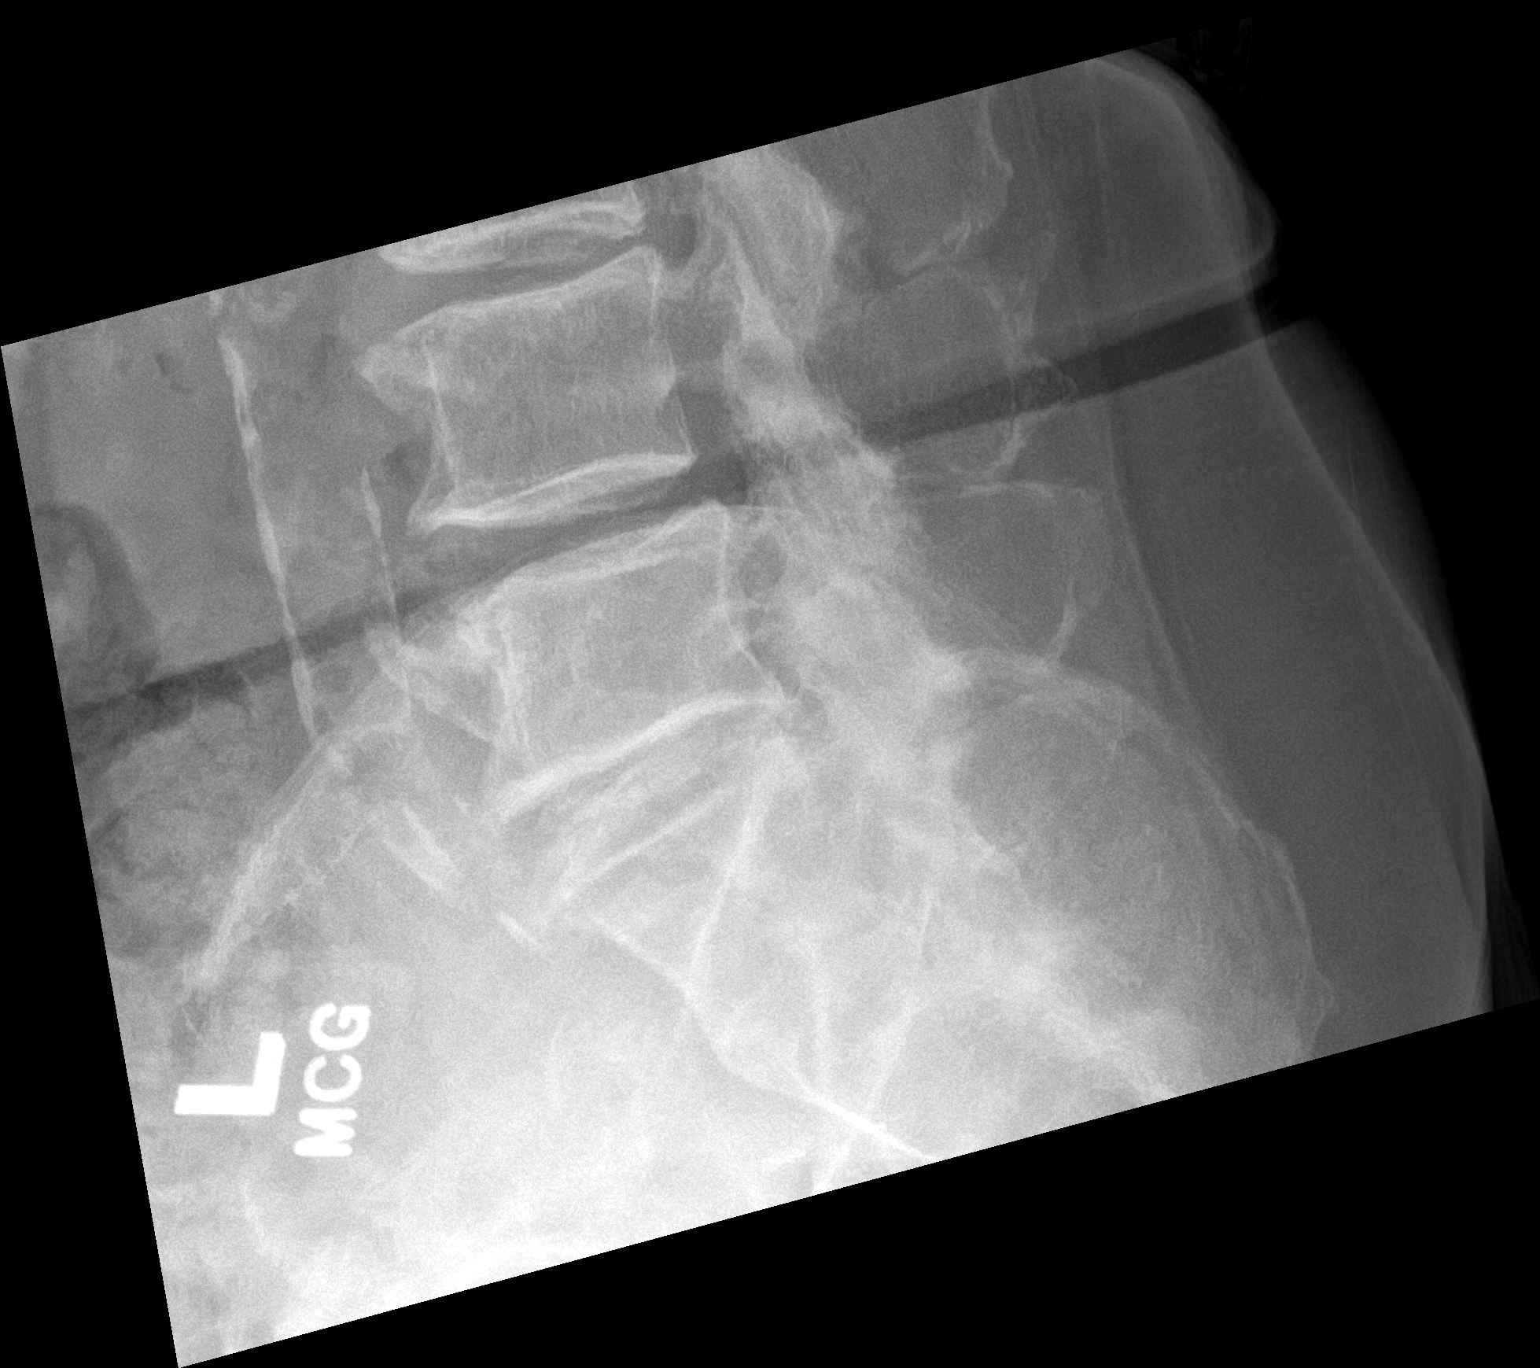

[3 of 3 positions shown; findings below may reference images not displayed]

FINDINGS: Lumbar vertebral body height height are preserved without evidence
of fracture. Minimal grade 1 anterolisthesis of L4 on L5. Mild
intervertebral disc space narrowing throughout the lumbar spine.
Facet arthropathy most significant in the lower lumbar spine. Severe
calcified atherosclerotic plaques noted in the aorta.
IMPRESSION: Degenerative changes with no acute fracture identified.

## 2022-08-25 IMAGING — DX DG HIP (WITH OR WITHOUT PELVIS) 2-3V*R*
3 series · 3 of 3 positions shown · non-contrast
Comparison: None Available.

CLINICAL DATA: Right hip pain

EXAM:
DG HIP (WITH OR WITHOUT PELVIS) 2-3V RIGHT

[pelvis ap]
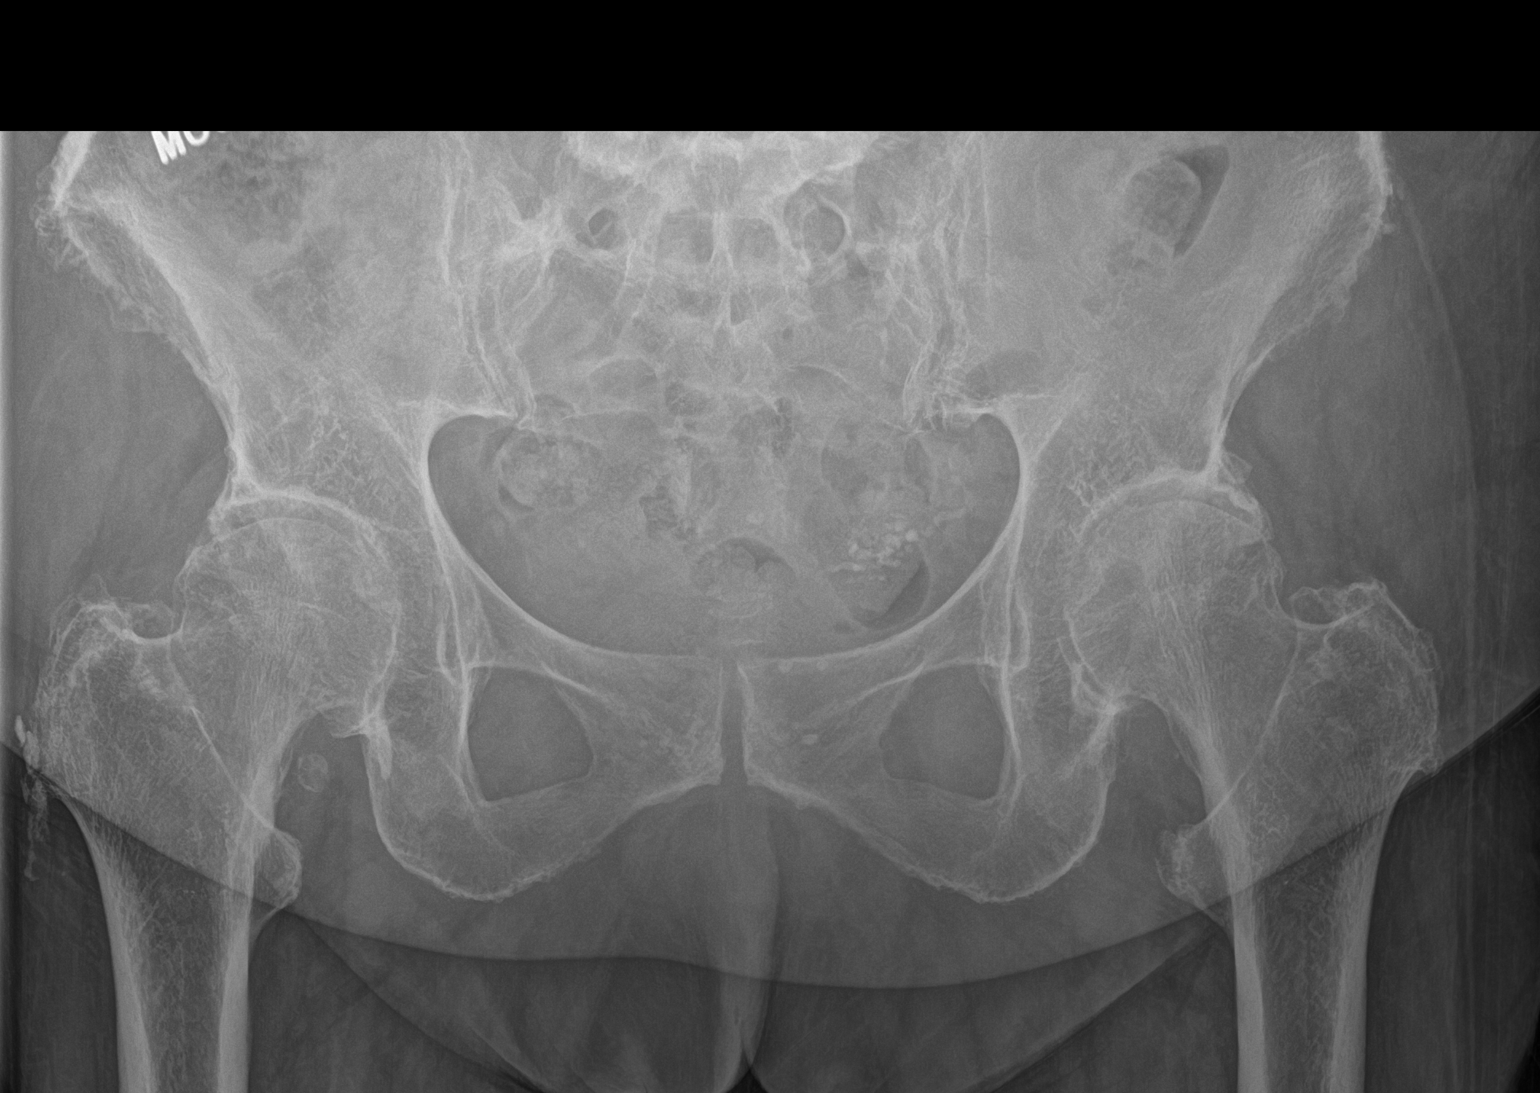

[hip ap]
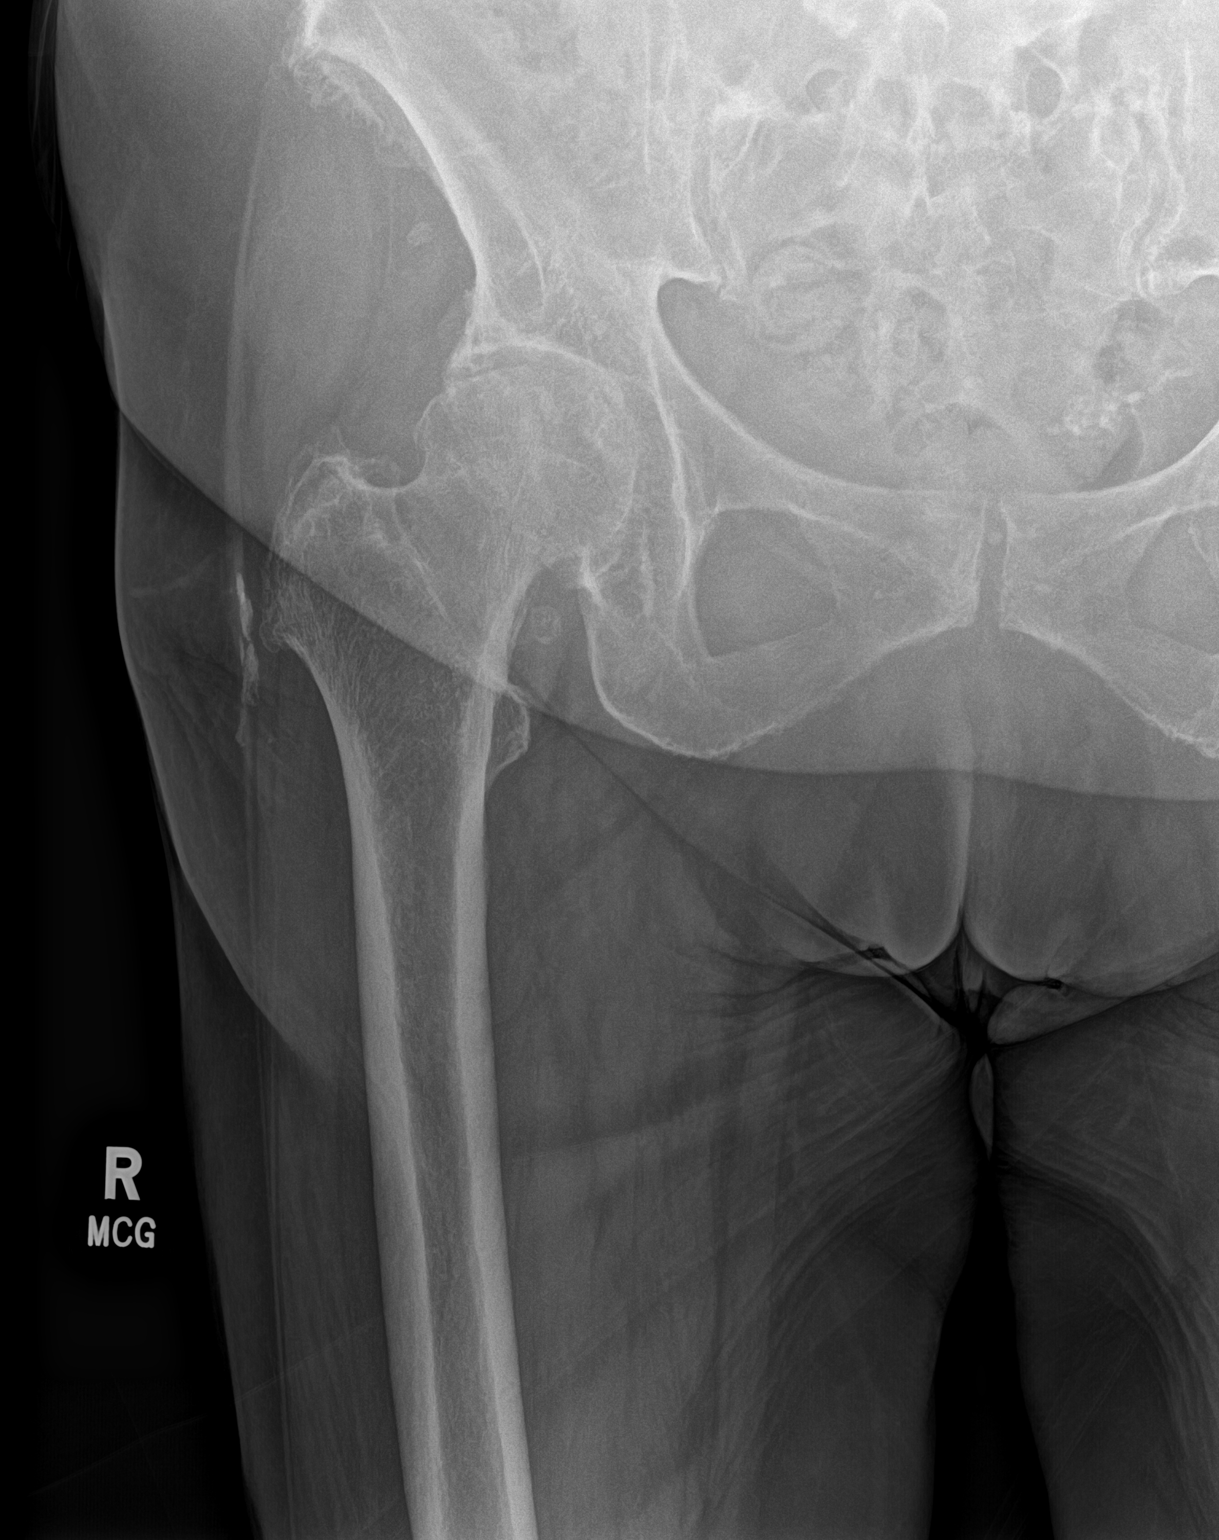

[hip frog leg]
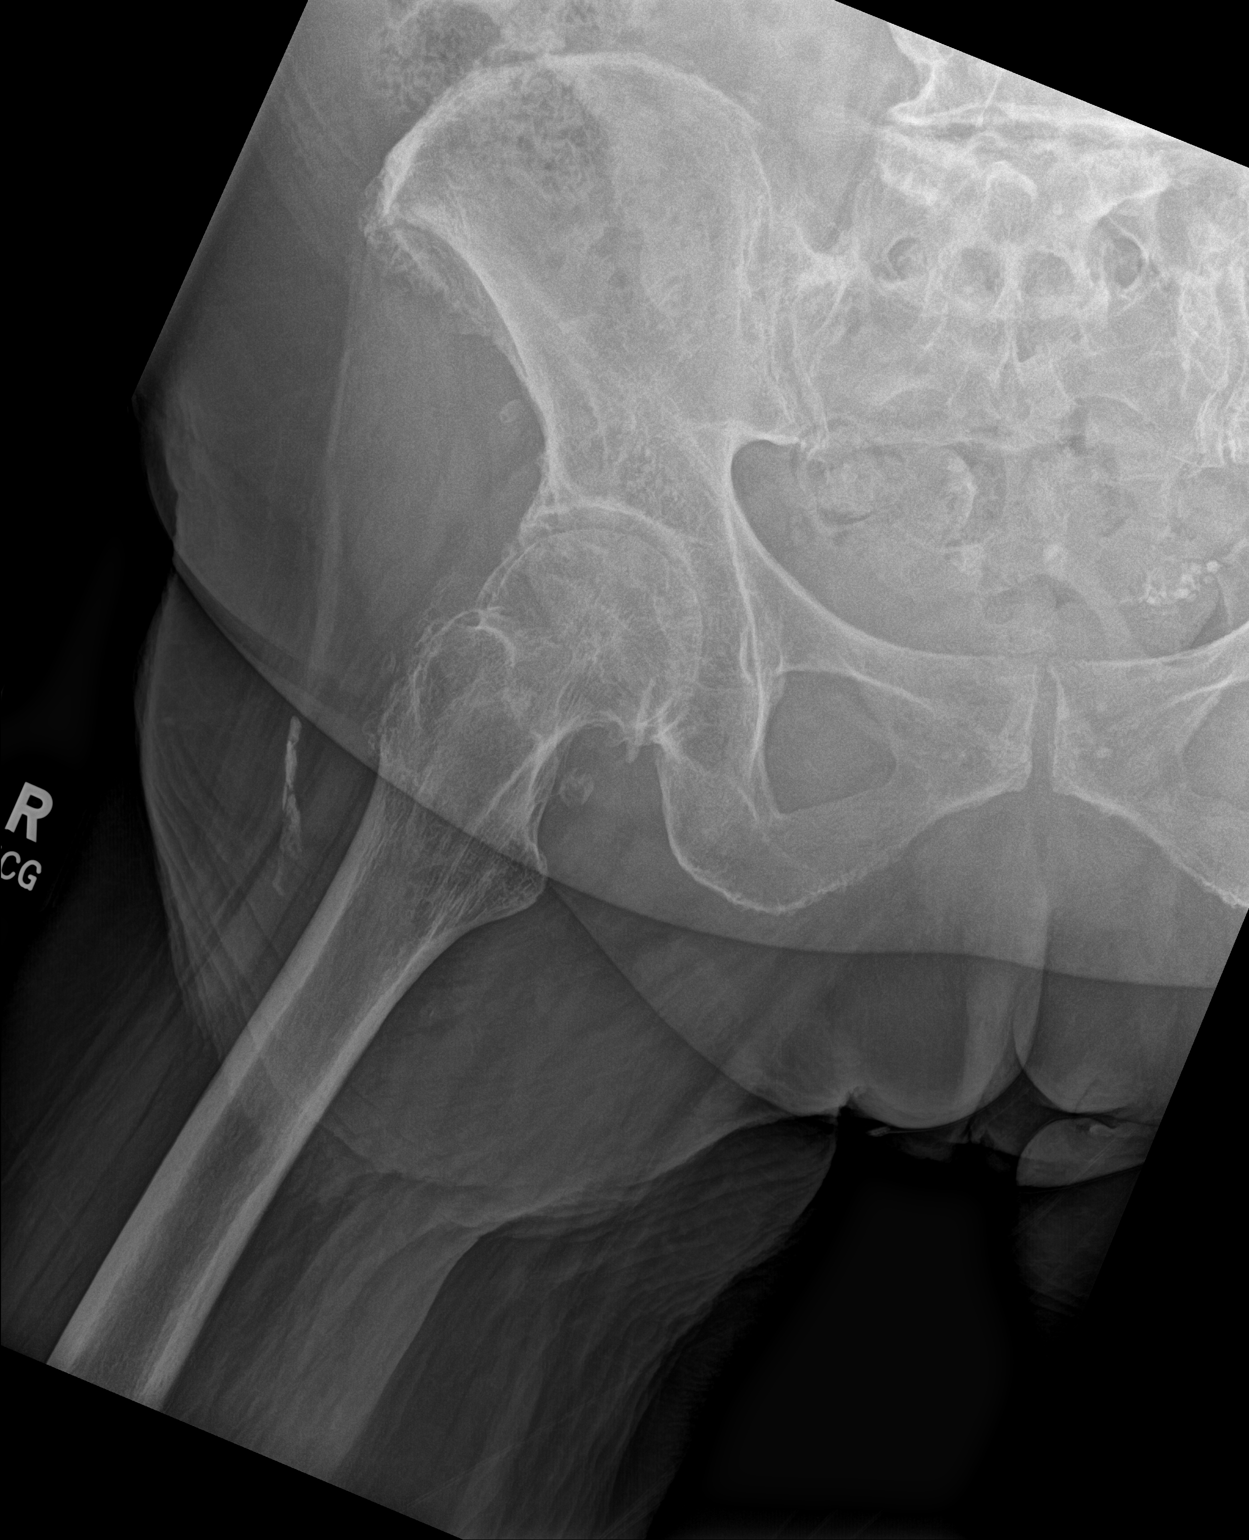

[3 of 3 positions shown; findings below may reference images not displayed]

FINDINGS: Frontal view of the pelvis as well as frontal and frogleg lateral
views of the right hip are obtained. There is moderate bilateral
symmetrical hip osteoarthritis. No acute fracture, subluxation, or
dislocation within either hip. Sacroiliac joints are unremarkable.
There is moderate spondylosis at the lumbosacral junction.
IMPRESSION: 1. Moderate symmetrical bilateral hip osteoarthritis. No acute bony
abnormality.
2. Lower lumbar spondylosis.

## 2022-09-11 ENCOUNTER — Telehealth: Payer: Self-pay | Admitting: Internal Medicine

## 2022-09-11 NOTE — Telephone Encounter (Signed)
Left message for patient to call back and schedule Medicare Annual Wellness Visit (AWV).  Please offer to do virtually or by telephone.  Last AWV  Due for awvi since  07/30/2009 per [facility]   Please schedule at any time with [facility] schedule 2   30 minute appointment   Any questions, please contact me at [phone]     Thank you,     [place] for  [place] [facility]  ??[id number] or ??[phone]

## 2022-10-22 ENCOUNTER — Encounter (INDEPENDENT_AMBULATORY_CARE_PROVIDER_SITE_OTHER): Payer: Medicare Other | Admitting: Ophthalmology

## 2022-11-05 ENCOUNTER — Ambulatory Visit: Payer: Medicare Other | Admitting: Podiatry

## 2022-11-05 DIAGNOSIS — M79675 Pain in left toe(s): Secondary | ICD-10-CM

## 2022-11-05 DIAGNOSIS — B351 Tinea unguium: Secondary | ICD-10-CM | POA: Diagnosis not present

## 2022-11-05 DIAGNOSIS — M79674 Pain in right toe(s): Secondary | ICD-10-CM | POA: Diagnosis not present

## 2022-11-07 NOTE — Progress Notes (Signed)
Subjective: Chief Complaint  Patient presents with   Nail Problem    Nail trim    89 year old female presents the office with above concerns.  She said her nails are thickened elongated she has difficulty trimming herself.  She is getting some nerve symptoms to her feet still.  Does not seem to have significantly worsened.  No open lesions.  Objective: AAO x3, NAD DP/PT pulses palpable bilaterally, CRT less than 3 seconds Nails are hypertrophic, dystrophic, brittle, discolored, elongated 10. No surrounding redness or drainage. Tenderness nails 1-5 bilaterally. No open lesions or pre-ulcerative lesions are identified today. 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  -Sharp debrided x 10 without any complications or bleeding. -Discussed neuropathy as well as natural progression.  We can consider oral medications or canal and hold off and continue to monitor. -Patient encouraged to call the office with any questions, concerns, change in symptoms.   Vivi Barrack DPM

## 2022-12-11 ENCOUNTER — Other Ambulatory Visit: Payer: Self-pay

## 2022-12-11 ENCOUNTER — Other Ambulatory Visit: Payer: Self-pay | Admitting: Internal Medicine

## 2022-12-14 ENCOUNTER — Ambulatory Visit: Payer: Medicare Other | Admitting: Internal Medicine

## 2022-12-28 ENCOUNTER — Encounter: Payer: Self-pay | Admitting: Internal Medicine

## 2022-12-28 ENCOUNTER — Ambulatory Visit (INDEPENDENT_AMBULATORY_CARE_PROVIDER_SITE_OTHER): Payer: Medicare Other | Admitting: Internal Medicine

## 2022-12-28 VITALS — BP 118/72 | HR 75 | Temp 98.5°F | Ht 67.0 in | Wt 157.0 lb

## 2022-12-28 DIAGNOSIS — E7800 Pure hypercholesterolemia, unspecified: Secondary | ICD-10-CM

## 2022-12-28 DIAGNOSIS — Z85118 Personal history of other malignant neoplasm of bronchus and lung: Secondary | ICD-10-CM | POA: Diagnosis not present

## 2022-12-28 DIAGNOSIS — E039 Hypothyroidism, unspecified: Secondary | ICD-10-CM | POA: Diagnosis not present

## 2022-12-28 DIAGNOSIS — Z0001 Encounter for general adult medical examination with abnormal findings: Secondary | ICD-10-CM

## 2022-12-28 DIAGNOSIS — N183 Chronic kidney disease, stage 3 unspecified: Secondary | ICD-10-CM

## 2022-12-28 DIAGNOSIS — E559 Vitamin D deficiency, unspecified: Secondary | ICD-10-CM | POA: Diagnosis not present

## 2022-12-28 DIAGNOSIS — E1165 Type 2 diabetes mellitus with hyperglycemia: Secondary | ICD-10-CM | POA: Diagnosis not present

## 2022-12-28 DIAGNOSIS — C3490 Malignant neoplasm of unspecified part of unspecified bronchus or lung: Secondary | ICD-10-CM

## 2022-12-28 DIAGNOSIS — E538 Deficiency of other specified B group vitamins: Secondary | ICD-10-CM

## 2022-12-28 DIAGNOSIS — R053 Chronic cough: Secondary | ICD-10-CM

## 2022-12-28 LAB — MICROALBUMIN / CREATININE URINE RATIO
Creatinine,U: 27.5 mg/dL
Microalb Creat Ratio: 2.5 mg/g (ref 0.0–30.0)
Microalb, Ur: <0.7 mg/dL (ref 0.0–1.9)

## 2022-12-28 LAB — LIPID PANEL
Cholesterol: 137 mg/dL (ref 0–200)
HDL: 40.1 mg/dL (ref >39.00)
LDL Cholesterol: 72 mg/dL (ref 0–99)
NonHDL: 96.75
Total CHOL/HDL Ratio: 3
Triglycerides: 126 mg/dL (ref 0.0–149.0)
VLDL: 25.2 mg/dL (ref 0.0–40.0)

## 2022-12-28 LAB — CBC WITH DIFFERENTIAL/PLATELET
Basophils Absolute: 0.1 10*3/uL (ref 0.0–0.1)
Basophils Relative: 0.6 % (ref 0.0–3.0)
Eosinophils Absolute: 0.2 10*3/uL (ref 0.0–0.7)
Eosinophils Relative: 1.9 % (ref 0.0–5.0)
HCT: 36.8 % (ref 36.0–46.0)
Hemoglobin: 12 g/dL (ref 12.0–15.0)
Lymphocytes Relative: 21 % (ref 12.0–46.0)
Lymphs Abs: 2.4 10*3/uL (ref 0.7–4.0)
MCHC: 32.6 g/dL (ref 30.0–36.0)
MCV: 86 fl (ref 78.0–100.0)
Monocytes Absolute: 0.9 10*3/uL (ref 0.1–1.0)
Monocytes Relative: 7.7 % (ref 3.0–12.0)
Neutro Abs: 7.9 10*3/uL — ABNORMAL HIGH (ref 1.4–7.7)
Neutrophils Relative %: 68.8 % (ref 43.0–77.0)
Platelets: 409 10*3/uL — ABNORMAL HIGH (ref 150.0–400.0)
RBC: 4.28 Mil/uL (ref 3.87–5.11)
RDW: 14.8 % (ref 11.5–15.5)
WBC: 11.4 10*3/uL — ABNORMAL HIGH (ref 4.0–10.5)

## 2022-12-28 LAB — HEPATIC FUNCTION PANEL
ALT: 9 U/L (ref 0–35)
AST: 13 U/L (ref 0–37)
Albumin: 3.8 g/dL (ref 3.5–5.2)
Alkaline Phosphatase: 67 U/L (ref 39–117)
Bilirubin, Direct: 0.1 mg/dL (ref 0.0–0.3)
Total Bilirubin: 0.4 mg/dL (ref 0.2–1.2)
Total Protein: 8.6 g/dL — ABNORMAL HIGH (ref 6.0–8.3)

## 2022-12-28 LAB — URINALYSIS, ROUTINE W REFLEX MICROSCOPIC
Bilirubin Urine: NEGATIVE
Hgb urine dipstick: NEGATIVE
Ketones, ur: NEGATIVE
Leukocytes,Ua: NEGATIVE
Nitrite: NEGATIVE
RBC / HPF: NONE SEEN (ref 0–?)
Specific Gravity, Urine: 1.01 (ref 1.000–1.030)
Total Protein, Urine: NEGATIVE
Urine Glucose: NEGATIVE
Urobilinogen, UA: 0.2 (ref 0.0–1.0)
pH: 7 (ref 5.0–8.0)

## 2022-12-28 LAB — BASIC METABOLIC PANEL
BUN: 18 mg/dL (ref 6–23)
CO2: 34 mEq/L — ABNORMAL HIGH (ref 19–32)
Calcium: 9.6 mg/dL (ref 8.4–10.5)
Chloride: 98 mEq/L (ref 96–112)
Creatinine, Ser: 1.05 mg/dL (ref 0.40–1.20)
GFR: 47.32 mL/min — ABNORMAL LOW (ref >60.00)
Glucose, Bld: 97 mg/dL (ref 70–99)
Potassium: 5.1 mEq/L (ref 3.5–5.1)
Sodium: 137 mEq/L (ref 135–145)

## 2022-12-28 LAB — TSH: TSH: 0.78 u[IU]/mL (ref 0.35–5.50)

## 2022-12-28 LAB — VITAMIN B12: Vitamin B-12: 315 pg/mL (ref 211–911)

## 2022-12-28 LAB — HEMOGLOBIN A1C: Hgb A1c MFr Bld: 5.7 % (ref 4.6–6.5)

## 2022-12-28 LAB — VITAMIN D 25 HYDROXY (VIT D DEFICIENCY, FRACTURES): VITD: 56.49 ng/mL (ref 30.00–100.00)

## 2022-12-28 MED ORDER — TRIAMCINOLONE ACETONIDE 55 MCG/ACT NA AERO: 2.0000 | INHALATION_SPRAY | Freq: Every day | NASAL | 12 refills | Status: AC

## 2022-12-28 MED ORDER — ALBUTEROL SULFATE HFA 108 (90 BASE) MCG/ACT IN AERS: 2.0000 | INHALATION_SPRAY | Freq: Four times a day (QID) | RESPIRATORY_TRACT | 0 refills | Status: AC | PRN

## 2022-12-28 MED ORDER — HYDROCODONE BIT-HOMATROP MBR 5-1.5 MG/5ML PO SOLN: 5.0000 mL | Freq: Four times a day (QID) | ORAL | 0 refills | Status: AC | PRN

## 2022-12-28 MED ORDER — PREDNISONE 10 MG PO TABS: ORAL_TABLET | ORAL | 0 refills | Status: DC

## 2022-12-28 NOTE — Progress Notes (Signed)
The test results show that your current treatment is OK, as the tests are stable.  Please continue the same plan.  There is no other need for change of treatment or further evaluation based on these results, at this time.  thanks 

## 2022-12-28 NOTE — Progress Notes (Unsigned)
Patient ID: Danielle Newman, female   DOB: 1932/07/23, 89 y.o.   MRN: [id number]         Chief Complaint:: wellness exam and cough ? Bronchitis, allergies, hx of lung cancer, low thyroid, hld, dm, ckd3a, low vit d       HPI:  Danielle Newman is a 89 y.o. female here for wellness exam; next eye exam sched for next month; for shingrix and tdap at the pharmacy, o/w up to date                        Also has hx of 2 mo onset scant prod cough without fever, chills and Does have several wks ongoing nasal allergy symptoms with clearish congestion, itch and sneezing, and maybe very mild wheezing per daughter, but without fever, pain, ST, swelling.   Pt denies polydipsia, polyuria, or new focal neuro s/s.    Pt denies fever, wt loss, night sweats, loss of appetite, or other constitutional symptoms  Has hx of lung cancer.     Wt Readings from Last 3 Encounters:  12/28/22 157 lb (71.2 kg)  06/11/22 161 lb 8 oz (73.3 kg)  02/19/22 165 lb 3.2 oz (74.9 kg)   BP Readings from Last 3 Encounters:  12/28/22 118/72  06/11/22 116/60  06/11/22 (!) 106/58   Immunization History  Administered Date(s) Administered   Fluad Quad(high Dose 65+) 06/18/2019, 05/20/2020, 05/19/2021, 06/11/2022   Influenza Whole 09/11/2010, 04/05/2011   Influenza, High Dose Seasonal PF 05/17/2017, 05/16/2018   Influenza, Seasonal, Injecte, Preservative Fre 08/14/2012   Influenza,inj,Quad PF,6+ Mos 04/23/2013, 05/12/2014, 05/31/2015   PFIZER(Purple Top)SARS-COV-2 Vaccination 11/23/2019, 12/14/2019   Pneumococcal Conjugate-13 04/23/2013   Pneumococcal Polysaccharide-23 01/01/2006, 09/11/2010   Td 09/11/2010   Zoster, Live 01/01/2006   Health Maintenance Due  Topic Date Due   Medicare Annual Wellness (AWV)  Never done   Zoster Vaccines- Shingrix (1 of 2) Never done   DTaP/Tdap/Td (2 - Tdap) 09/11/2020      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 Her smoking use included cigarettes. She smoked an average of 2 packs per day. She has never used smokeless tobacco. She reports that she does not drink alcohol and does not use drugs. family history includes Allergies in her daughter; Cancer in her daughter and mother. Allergies  Allergen Reactions   Fioricet [Butalbital-Apap-Caffeine] Other (See Comments)    confusion   Meperidine Hcl    Current Outpatient Medications on File Prior to Visit  Medication Sig Dispense Refill   AMBULATORY NON FORMULARY MEDICATION Custom shoe lifts to treat acquired leg length discrepancy. [facility]   Fax # [phone] 1 each 0   aspirin 81 MG tablet Take 81 mg by mouth daily.     citalopram (CELEXA) 20 MG  tablet TAKE ONE TABLET BY MOUTH DAILY 90 tablet 2   cyclobenzaprine (FLEXERIL) 5 MG tablet Take 1 tablet (5 mg total) by mouth 3 (three) times daily as needed for muscle spasms. 40 tablet 2   diazepam (VALIUM) 10 MG tablet Take 1 tablet (10 mg total) by mouth 2 (two) times daily as needed. 60 tablet 1   Ergocalciferol 10 MCG (400 UNIT) TABS Take by mouth.     levothyroxine (SYNTHROID) 100 MCG tablet TAKE 1 TABLET BY MOUTH DAILY 90 tablet 3   lovastatin (MEVACOR) 40 MG tablet TAKE 1 TABLET BY MOUTH DAILY 90 tablet 3   neomycin-polymyxin b-dexamethasone (MAXITROL) 3.5-10000-0.1 OINT      pantoprazole (PROTONIX) 40 MG tablet TAKE ONE TABLET BY  MOUTH TWICE A DAY 180 tablet 3   timolol (TIMOPTIC) 0.5 % ophthalmic solution INSTILL ONE DROP INTO EACH EYE TWO TIMES A DAY 15 mL 12   No current facility-administered medications on file prior to visit.        ROS:  All others reviewed and negative.  Objective        PE:  BP 118/72 (BP Location: Left Arm, Patient Position: Sitting, Cuff Size: Normal)   Pulse 75   Temp 98.5 F (36.9 C) (Oral)   Ht 5\' 7"  (1.702 m)   Wt 157 lb (71.2 kg)   SpO2 97%   BMI 24.59 kg/m                 Constitutional: Pt appears in NAD               HENT: Head: NCAT.                Right Ear: External ear normal.                 Left Ear: External ear normal. Bilat tm's with mild erythema.  Max sinus areas non tender.  Pharynx with mild erythema, no exudate               Eyes: . Pupils are equal, round, and reactive to light. Conjunctivae and EOM are normal               Nose: without d/c or deformity               Neck: Neck supple. Gross normal ROM               Cardiovascular: Normal rate and regular rhythm.                 Pulmonary/Chest: Effort normal and breath sounds without rales or wheezing.                Abd:  Soft, NT, ND, + BS, no organomegaly               Neurological: Pt is alert. At baseline orientation, motor grossly intact               Skin: Skin is warm. No rashes, no other new lesions, LE edema - none               Psychiatric: Pt behavior is normal without agitation   Micro: none  Cardiac tracings I have personally interpreted today:  none  Pertinent Radiological findings (summarize): none   Lab Results  Component Value Date   WBC 11.4 (H) 12/28/2022   HGB 12.0 12/28/2022   HCT 36.8 12/28/2022   PLT 409.0 (H) 12/28/2022   GLUCOSE  97 12/28/2022   CHOL 137 12/28/2022   TRIG 126.0 12/28/2022   HDL 40.10 12/28/2022   LDLDIRECT 95.0 12/08/2021   LDLCALC 72 12/28/2022   ALT 9 12/28/2022   AST 13 12/28/2022   NA 137 12/28/2022   K 5.1 12/28/2022   CL 98 12/28/2022    CREATININE 1.05 12/28/2022   BUN 18 12/28/2022   CO2 34 (H) 12/28/2022   TSH 0.78 12/28/2022   HGBA1C 5.7 12/28/2022   MICROALBUR <0.7 12/28/2022   Assessment/Plan:  Danielle Newman is a 89 y.o. White or Caucasian [1] female with  has a past medical history of ALLERGIC RHINITIS (02/11/2008), ANXIETY (02/11/2008), Blind right eye (11/20/2011), CORONARY ARTERY DISEASE (02/11/2008), DEPRESSION (02/11/2008), DM (diabetes mellitus) (HCC) (11/21/2011), DYSPNEA (09/11/2010), GERD (02/11/2008), HYPERLIPIDEMIA (02/11/2008), HYPOTHYROIDISM (02/11/2008), Impaired glucose tolerance (11/20/2011), LUNG CANCER, HX OF (02/11/2008), MACULAR DEGENERATION (03/29/2009), OSTEOPENIA (02/11/2008), Sarcoidosis (02/11/2008), and Sight impaired (04/05/2011).  LUNG CANCER, HX OF Also for CT chest to f/u in light of worsening cough  Encounter for well adult exam with abnormal findings Age and sex appropriate education and counseling updated with regular exercise and diet Referrals for preventative services - for eye exam next month Immunizations addressed - for shingrix and tdap at pharmacy Smoking counseling  - none needed Evidence for depression or other mood disorder - none significant Most recent labs reviewed. I have personally reviewed and have noted: 1) the patient's medical and social history 2) The patient's current medications and supplements 3) The patient's height, weight, and BMI have been recorded in the chart   Hypothyroidism Lab Results  Component Value Date   TSH 0.78 12/28/2022   Stable, pt to continue levothyroxine 100 mcg qd   Hyperlipidemia Lab Results  Component Value Date   LDLCALC 72 12/28/2022   uncontrolled, pt to continue current statin lovastatin 40 qd and lower chol diet, declines increased dose fo rnow   DM (diabetes mellitus) Lab Results  Component Value Date   HGBA1C 5.7 12/28/2022   Stable, pt to continue current medical treatment  - diet, wt control   CKD (chronic kidney  disease) stage 3, GFR 30-59 ml/min Lab Results  Component Value Date   CREATININE 1.05 12/28/2022   Stable overall, cont to avoid nephrotoxins   Vitamin D deficiency Last vitamin D Lab Results  Component Value Date   VD25OH 56.49 12/28/2022   Stable, cont oral replacement   Cough ? Allergy and post nasal gtt related, worsened recently depspite zyrtec 10 qd, add nasacort asd, prednisone taper, cough med prn, and albuterol inhaler prn, consdier PPI if not improved  Followup: Return in about 6 months (around 06/29/2023).  Oliver Barre, MD 12/30/2022 7:45 AM [facility] [place] [facility] - [facility] Internal Medicine

## 2022-12-28 NOTE — Patient Instructions (Signed)
Please take all new medication as prescribed - the nasacort as directed, prednisone trial, cough medicine as needed, and albuterol inhaler as needed  Please continue all other medications as before, including the zyrtec  Please have the pharmacy call with any other refills you may need.  Please continue your efforts at being more active, low cholesterol diet, and weight control.  You are otherwise up to date with prevention measures today.  Please keep your appointments with your specialists as you may have planned  You will be contacted regarding the referral for: CT scan for the chest  Please go to the LAB at the blood drawing area for the tests to be done  You will be contacted by phone if any changes need to be made immediately.  Otherwise, you will receive a letter about your results with an explanation, but please check with MyChart first.  Please remember to sign up for MyChart if you have not done so, as this will be important to you in the future with finding out test results, communicating by private email, and scheduling acute appointments online when needed.  Please make an Appointment to return in 6 months, or sooner if needed

## 2022-12-30 ENCOUNTER — Encounter: Payer: Self-pay | Admitting: Internal Medicine

## 2022-12-30 NOTE — Assessment & Plan Note (Signed)
Also for CT chest to f/u in light of worsening cough

## 2022-12-30 NOTE — Assessment & Plan Note (Signed)
Lab Results  Component Value Date   TSH 0.78 12/28/2022   Stable, pt to continue levothyroxine 100 mcg qd

## 2022-12-30 NOTE — Assessment & Plan Note (Signed)
?   Allergy and post nasal gtt related, worsened recently depspite zyrtec 10 qd, add nasacort asd, prednisone taper, cough med prn, and albuterol inhaler prn, consdier PPI if not improved

## 2022-12-30 NOTE — Assessment & Plan Note (Signed)
Lab Results  Component Value Date   HGBA1C 5.7 12/28/2022   Stable, pt to continue current medical treatment  - diet, wt control

## 2022-12-30 NOTE — Assessment & Plan Note (Signed)
Last vitamin D Lab Results  Component Value Date   VD25OH 56.49 12/28/2022   Stable, cont oral replacement

## 2022-12-30 NOTE — Assessment & Plan Note (Signed)
Lab Results  Component Value Date   CREATININE 1.05 12/28/2022   Stable overall, cont to avoid nephrotoxins

## 2022-12-30 NOTE — Assessment & Plan Note (Signed)
Age and sex appropriate education and counseling updated with regular exercise and diet Referrals for preventative services - for eye exam next month Immunizations addressed - for shingrix and tdap at pharmacy Smoking counseling  - none needed Evidence for depression or other mood disorder - none significant Most recent labs reviewed. I have personally reviewed and have noted: 1) the patient's medical and social history 2) The patient's current medications and supplements 3) The patient's height, weight, and BMI have been recorded in the chart

## 2022-12-30 NOTE — Assessment & Plan Note (Addendum)
Lab Results  Component Value Date   LDLCALC 72 12/28/2022   uncontrolled, pt to continue current statin lovastatin 40 qd and lower chol diet, declines increased dose fo rnow

## 2023-02-04 DIAGNOSIS — H401122 Primary open-angle glaucoma, left eye, moderate stage: Secondary | ICD-10-CM | POA: Diagnosis not present

## 2023-02-04 DIAGNOSIS — H401113 Primary open-angle glaucoma, right eye, severe stage: Secondary | ICD-10-CM | POA: Diagnosis not present

## 2023-02-04 DIAGNOSIS — H3523 Other non-diabetic proliferative retinopathy, bilateral: Secondary | ICD-10-CM | POA: Diagnosis not present

## 2023-02-04 DIAGNOSIS — H353134 Nonexudative age-related macular degeneration, bilateral, advanced atrophic with subfoveal involvement: Secondary | ICD-10-CM | POA: Diagnosis not present

## 2023-02-04 DIAGNOSIS — H353233 Exudative age-related macular degeneration, bilateral, with inactive scar: Secondary | ICD-10-CM | POA: Diagnosis not present

## 2023-02-04 LAB — HM DIABETES EYE EXAM

## 2023-02-11 DIAGNOSIS — H353134 Nonexudative age-related macular degeneration, bilateral, advanced atrophic with subfoveal involvement: Secondary | ICD-10-CM | POA: Diagnosis not present

## 2023-02-11 DIAGNOSIS — H353231 Exudative age-related macular degeneration, bilateral, with active choroidal neovascularization: Secondary | ICD-10-CM | POA: Diagnosis not present

## 2023-02-11 DIAGNOSIS — H401113 Primary open-angle glaucoma, right eye, severe stage: Secondary | ICD-10-CM | POA: Diagnosis not present

## 2023-02-11 DIAGNOSIS — H401122 Primary open-angle glaucoma, left eye, moderate stage: Secondary | ICD-10-CM | POA: Diagnosis not present

## 2023-02-28 ENCOUNTER — Telehealth: Payer: Self-pay | Admitting: Internal Medicine

## 2023-02-28 NOTE — Telephone Encounter (Signed)
Patient called and said that she was here in July and that you were supposed to order a cat scan - please call patient and let her know if this has been done.  [phone]

## 2023-02-28 NOTE — Telephone Encounter (Signed)
LVM for Pt in regards of CT scan orders. They are put in she just needs to go get the CT done

## 2023-03-01 ENCOUNTER — Ambulatory Visit: Payer: Medicare Other | Admitting: Podiatry

## 2023-03-01 DIAGNOSIS — M79674 Pain in right toe(s): Secondary | ICD-10-CM | POA: Diagnosis not present

## 2023-03-01 DIAGNOSIS — M79675 Pain in left toe(s): Secondary | ICD-10-CM

## 2023-03-01 DIAGNOSIS — B351 Tinea unguium: Secondary | ICD-10-CM

## 2023-03-01 MED ORDER — CICLOPIROX 8 % EX SOLN: Freq: Every day | CUTANEOUS | 2 refills | Status: AC

## 2023-03-01 NOTE — Progress Notes (Unsigned)
Subjective: Chief Complaint  Patient presents with   Nail Problem    Routine Foot Care-nail trim     89 year old female presents the office with above concerns.  She said her nails are thickened elongated she has difficulty trimming herself.  No open lesions that she reports.  No new concerns.  Objective: AAO x3, NAD DP/PT pulses palpable bilaterally, CRT less than 3 seconds Nails are hypertrophic, dystrophic, brittle, discolored, elongated 10. No surrounding redness or drainage. Tenderness nails 1-5 bilaterally. No open lesions or pre-ulcerative lesions are identified today. 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  -Sharp debrided x 10 without any complications or bleeding. -Daily foot inspection -Patient encouraged to call the office with any questions, concerns, change in symptoms.   Vivi Barrack DPM

## 2023-03-05 DIAGNOSIS — H6123 Impacted cerumen, bilateral: Secondary | ICD-10-CM | POA: Diagnosis not present

## 2023-03-22 ENCOUNTER — Ambulatory Visit: Payer: Medicare Other | Admitting: Podiatry

## 2023-04-03 ENCOUNTER — Telehealth: Payer: Self-pay | Admitting: Internal Medicine

## 2023-04-03 DIAGNOSIS — C3490 Malignant neoplasm of unspecified part of unspecified bronchus or lung: Secondary | ICD-10-CM

## 2023-04-03 NOTE — Telephone Encounter (Signed)
Patient never received a call to have her CT-chest with Contrast - please call daughter - [phone]

## 2023-04-03 NOTE — Telephone Encounter (Signed)
Ok I have ordered again

## 2023-04-04 NOTE — Telephone Encounter (Signed)
Called and left voice mail

## 2023-06-07 ENCOUNTER — Ambulatory Visit: Payer: Medicare Other | Admitting: Podiatry

## 2023-06-12 DIAGNOSIS — H16223 Keratoconjunctivitis sicca, not specified as Sjogren's, bilateral: Secondary | ICD-10-CM | POA: Diagnosis not present

## 2023-06-12 DIAGNOSIS — H04123 Dry eye syndrome of bilateral lacrimal glands: Secondary | ICD-10-CM | POA: Diagnosis not present

## 2023-07-01 ENCOUNTER — Ambulatory Visit: Payer: Medicare Other | Admitting: Internal Medicine

## 2023-07-01 VITALS — BP 120/76 | HR 88 | Temp 98.5°F | Ht 67.0 in | Wt 150.0 lb

## 2023-07-01 DIAGNOSIS — E1165 Type 2 diabetes mellitus with hyperglycemia: Secondary | ICD-10-CM | POA: Diagnosis not present

## 2023-07-01 DIAGNOSIS — R1310 Dysphagia, unspecified: Secondary | ICD-10-CM

## 2023-07-01 DIAGNOSIS — Z23 Encounter for immunization: Secondary | ICD-10-CM | POA: Diagnosis not present

## 2023-07-01 DIAGNOSIS — E039 Hypothyroidism, unspecified: Secondary | ICD-10-CM

## 2023-07-01 DIAGNOSIS — E7800 Pure hypercholesterolemia, unspecified: Secondary | ICD-10-CM | POA: Diagnosis not present

## 2023-07-01 DIAGNOSIS — E559 Vitamin D deficiency, unspecified: Secondary | ICD-10-CM | POA: Diagnosis not present

## 2023-07-01 DIAGNOSIS — N1831 Chronic kidney disease, stage 3a: Secondary | ICD-10-CM

## 2023-07-01 DIAGNOSIS — F411 Generalized anxiety disorder: Secondary | ICD-10-CM

## 2023-07-01 LAB — HEPATIC FUNCTION PANEL
ALT: 12 U/L (ref 0–35)
AST: 16 U/L (ref 0–37)
Albumin: 4.2 g/dL (ref 3.5–5.2)
Alkaline Phosphatase: 64 U/L (ref 39–117)
Bilirubin, Direct: 0.1 mg/dL (ref 0.0–0.3)
Total Bilirubin: 0.5 mg/dL (ref 0.2–1.2)
Total Protein: 8.1 g/dL (ref 6.0–8.3)

## 2023-07-01 LAB — LIPID PANEL
Cholesterol: 168 mg/dL (ref 0–200)
HDL: 43.2 mg/dL (ref >39.00)
LDL Cholesterol: 86 mg/dL (ref 0–99)
NonHDL: 124.41
Total CHOL/HDL Ratio: 4
Triglycerides: 192 mg/dL — ABNORMAL HIGH (ref 0.0–149.0)
VLDL: 38.4 mg/dL (ref 0.0–40.0)

## 2023-07-01 LAB — BASIC METABOLIC PANEL
BUN: 20 mg/dL (ref 6–23)
CO2: 32 meq/L (ref 19–32)
Calcium: 9.7 mg/dL (ref 8.4–10.5)
Chloride: 98 meq/L (ref 96–112)
Creatinine, Ser: 0.97 mg/dL (ref 0.40–1.20)
GFR: 51.85 mL/min — ABNORMAL LOW (ref >60.00)
Glucose, Bld: 116 mg/dL — ABNORMAL HIGH (ref 70–99)
Potassium: 4.8 meq/L (ref 3.5–5.1)
Sodium: 138 meq/L (ref 135–145)

## 2023-07-01 LAB — HEMOGLOBIN A1C: Hgb A1c MFr Bld: 5.5 % (ref 4.6–6.5)

## 2023-07-01 MED ORDER — CITALOPRAM HYDROBROMIDE 10 MG PO TABS: 10.0000 mg | ORAL_TABLET | Freq: Every day | ORAL | 3 refills | Status: DC

## 2023-07-01 NOTE — Progress Notes (Unsigned)
Patient ID: [name], female   DOB: [birth year], 89 y.o.   MRN: [id number]        Chief Complaint: follow up HTN, HLD, dm,and low thyroid, low vit d, dysphagia, anxiety       HPI:  Danielle Newman is a 89 y.o. female here overall doing ok with daughter, but anxiety has markedly increased recently having run out of celexa for several months; also   also has had several episodes dysphagia with food getting stuck as the epigastric area, then vomiting after trying to pass it with water.  Pt denies chest pain, increased sob or doe, wheezing, orthopnea, PND, increased LE swelling, palpitations, dizziness or syncope.   Pt denies polydipsia, polyuria, or new focal neuro s/s.    Pt denies fever, wt loss, night sweats, loss of appetite, or other constitutional symptoms   Due for flu shot.         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 Her smoking use included cigarettes. She has never used smokeless tobacco. She reports that she does not drink alcohol and does not use drugs. family history includes Allergies in her daughter; Cancer in her  daughter and mother. Allergies  Allergen Reactions   Fioricet [Butalbital-Apap-Caffeine] Other (See Comments)    confusion   Meperidine Hcl    Current Outpatient Medications on File Prior to Visit  Medication Sig Dispense Refill   albuterol (VENTOLIN HFA) 108 (90 Base) MCG/ACT inhaler Inhale 2 puffs into the lungs every 6 (six) hours as needed for wheezing or shortness of breath. 8 g 0   AMBULATORY NON FORMULARY MEDICATION Custom shoe lifts to treat acquired leg length discrepancy. [facility]   Fax # [phone] 1 each 0   aspirin 81 MG tablet Take 81 mg by mouth daily.     ciclopirox (PENLAC) 8 % solution Apply topically at bedtime. Apply over nail and surrounding skin. Apply daily over previous coat. After seven (7) days, may remove with alcohol and continue cycle. 6.6 mL 2   cyclobenzaprine (FLEXERIL) 5 MG tablet Take 1 tablet (5 mg total) by mouth 3 (three) times daily as needed for muscle spasms. 40 tablet 2   Ergocalciferol 10 MCG (400 UNIT) TABS Take by mouth.     levothyroxine (SYNTHROID) 100 MCG tablet TAKE 1 TABLET BY MOUTH DAILY 90 tablet 3   lovastatin (MEVACOR) 40 MG tablet TAKE 1 TABLET BY MOUTH DAILY 90 tablet 3   neomycin-polymyxin b-dexamethasone (MAXITROL) 3.5-10000-0.1 OINT  pantoprazole (PROTONIX) 40 MG tablet TAKE ONE TABLET BY MOUTH TWICE A DAY 180 tablet 3   timolol (TIMOPTIC) 0.5 % ophthalmic solution INSTILL ONE DROP INTO EACH EYE TWO TIMES A DAY 15 mL 12   triamcinolone (NASACORT) 55 MCG/ACT AERO nasal inhaler Place 2 sprays into the nose daily. 1 each 12   No current facility-administered medications on file prior to visit.        ROS:  All others reviewed and negative.  Objective        PE:  BP 120/76 (BP Location: Left Arm, Patient Position: Sitting, Cuff Size: Normal)   Pulse 88   Temp 98.5 F (36.9 C) (Oral)   Ht 5\' 7"  (1.702 m)   Wt 150 lb (68 kg)   SpO2 95%   BMI 23.49 kg/m                 Constitutional: Pt appears in NAD                HENT: Head: NCAT.                Right Ear: External ear normal.                 Left Ear: External ear normal.                Eyes: . Pupils are equal, round, and reactive to light. Conjunctivae and EOM are normal               Nose: without d/c or deformity               Neck: Neck supple. Gross normal ROM               Cardiovascular: Normal rate and regular rhythm.                 Pulmonary/Chest: Effort normal and breath sounds without rales or wheezing.                Abd:  Soft, NT, ND, + BS, no organomegaly               Neurological: Pt is alert. At baseline orientation, motor grossly intact               Skin: Skin is warm. No rashes, no other new lesions, LE edema - trace bilat               Psychiatric: Pt behavior is normal without agitation   Micro: none  Cardiac tracings I have personally interpreted today:  none  Pertinent Radiological findings (summarize): none   Lab Results  Component Value Date   WBC 11.4 (H) 12/28/2022   HGB 12.0 12/28/2022   HCT 36.8 12/28/2022   PLT 409.0 (H) 12/28/2022   GLUCOSE 116 (H) 07/01/2023   CHOL 168 07/01/2023   TRIG 192.0 (H) 07/01/2023   HDL 43.20 07/01/2023   LDLDIRECT 95.0 12/08/2021   LDLCALC 86 07/01/2023   ALT 12 07/01/2023   AST 16 07/01/2023   NA 138 07/01/2023   K 4.8 07/01/2023   CL 98 07/01/2023   CREATININE 0.97 07/01/2023   BUN 20 07/01/2023   CO2 32 07/01/2023   TSH 0.78 12/28/2022   HGBA1C 5.5 07/01/2023   MICROALBUR <0.7 12/28/2022   Assessment/Plan:  Danielle Newman is a 89 y.o. White or Caucasian [1] female with  has a past medical history of ALLERGIC RHINITIS (02/11/2008), ANXIETY (02/11/2008), Blind right  eye (11/20/2011), CORONARY ARTERY DISEASE (02/11/2008), DEPRESSION (02/11/2008), DM (diabetes mellitus) (HCC) (11/21/2011), DYSPNEA (09/11/2010), GERD (02/11/2008), HYPERLIPIDEMIA (02/11/2008), HYPOTHYROIDISM (02/11/2008), Impaired glucose tolerance (11/20/2011), LUNG CANCER, HX OF (02/11/2008), MACULAR  DEGENERATION (03/29/2009), OSTEOPENIA (02/11/2008), Sarcoidosis (02/11/2008), and Sight impaired (04/05/2011).  Vitamin D deficiency Last vitamin D Lab Results  Component Value Date   VD25OH 56.49 12/28/2022   Stable, cont oral replacement   Hypothyroidism Lab Results  Component Value Date   TSH 0.78 12/28/2022   Stable, pt to continue levothyroxine 100 mcg qd   Hyperlipidemia Lab Results  Component Value Date   LDLCALC 86 07/01/2023   uncontrolled, pt to continue current statin lovastatn 40 mg every day, declines change for now   Dysphagia Worsening recently symptomatically, now for GI referral, may need EGD  DM (diabetes mellitus) Lab Results  Component Value Date   HGBA1C 5.5 07/01/2023   Stable, pt to continue current medical treatment  - diet, wt control   CKD (chronic kidney disease) stage 3, GFR 30-59 ml/min Lab Results  Component Value Date   CREATININE 0.97 07/01/2023   Stable overall, cont to avoid nephrotoxins   Anxiety state Worsening subjectively recently - for restart celexa 10 mg qd  Followup: Return in about 6 months (around 12/30/2023).  Oliver Barre, MD 07/02/2023 3:15 AM [facility]  Primary Care - [facility] Internal Medicine

## 2023-07-01 NOTE — Progress Notes (Signed)
The test results show that your current treatment is OK, as the tests are stable.  Please continue the same plan.  There is no other need for change of treatment or further evaluation based on these results, at this time.  thanks 

## 2023-07-01 NOTE — Patient Instructions (Signed)
You had the flu shot today  Please take all new medication as prescribed - the celexa 10 mg per day  Please continue all other medications as before, and refills have been done if requested.  Please have the pharmacy call with any other refills you may need.  Please continue your efforts at being more active, low cholesterol diet, and weight control.  You are otherwise up to date with prevention measures today.  Please keep your appointments with your specialists as you may have planned  You will be contacted regarding the referral for: Gastroenterology  Please go to the LAB at the blood drawing area for the tests to be done  You will be contacted by phone if any changes need to be made immediately.  Otherwise, you will receive a letter about your results with an explanation, but please check with MyChart first.

## 2023-07-02 ENCOUNTER — Encounter: Payer: Self-pay | Admitting: Internal Medicine

## 2023-07-02 NOTE — Assessment & Plan Note (Signed)
Worsening recently symptomatically, now for GI referral, may need EGD

## 2023-07-02 NOTE — Assessment & Plan Note (Signed)
Lab Results  Component Value Date   HGBA1C 5.5 07/01/2023   Stable, pt to continue current medical treatment  - diet, wt control

## 2023-07-02 NOTE — Assessment & Plan Note (Signed)
Last vitamin D Lab Results  Component Value Date   VD25OH 56.49 12/28/2022   Stable, cont oral replacement

## 2023-07-02 NOTE — Assessment & Plan Note (Signed)
Worsening subjectively recently - for restart celexa 10 mg qd

## 2023-07-02 NOTE — Assessment & Plan Note (Signed)
Lab Results  Component Value Date   TSH 0.78 12/28/2022   Stable, pt to continue levothyroxine 100 mcg qd

## 2023-07-02 NOTE — Assessment & Plan Note (Signed)
Lab Results  Component Value Date   LDLCALC 86 07/01/2023   uncontrolled, pt to continue current statin lovastatn 40 mg every day, declines change for now

## 2023-07-02 NOTE — Assessment & Plan Note (Signed)
Lab Results  Component Value Date   CREATININE 0.97 07/01/2023   Stable overall, cont to avoid nephrotoxins

## 2023-07-11 DIAGNOSIS — S0502XA Injury of conjunctiva and corneal abrasion without foreign body, left eye, initial encounter: Secondary | ICD-10-CM | POA: Diagnosis not present

## 2023-07-11 DIAGNOSIS — H5711 Ocular pain, right eye: Secondary | ICD-10-CM | POA: Diagnosis not present

## 2023-07-13 DIAGNOSIS — S0502XA Injury of conjunctiva and corneal abrasion without foreign body, left eye, initial encounter: Secondary | ICD-10-CM | POA: Diagnosis not present

## 2023-07-13 DIAGNOSIS — H5711 Ocular pain, right eye: Secondary | ICD-10-CM | POA: Diagnosis not present

## 2023-07-15 ENCOUNTER — Encounter (HOSPITAL_COMMUNITY): Payer: Self-pay

## 2023-07-15 ENCOUNTER — Emergency Department (HOSPITAL_COMMUNITY): Payer: Medicare Other

## 2023-07-15 ENCOUNTER — Inpatient Hospital Stay (HOSPITAL_COMMUNITY)
Admission: EM | Admit: 2023-07-15 | Discharge: 2023-07-19 | DRG: 378 | Disposition: A | Discharge status: Home Health | Payer: Medicare Other | Attending: Family Medicine | Admitting: Family Medicine

## 2023-07-15 ENCOUNTER — Encounter: Payer: Self-pay | Admitting: Internal Medicine

## 2023-07-15 ENCOUNTER — Other Ambulatory Visit: Payer: Self-pay

## 2023-07-15 ENCOUNTER — Ambulatory Visit: Payer: Medicare Other | Admitting: Internal Medicine

## 2023-07-15 DIAGNOSIS — K260 Acute duodenal ulcer with hemorrhage: Secondary | ICD-10-CM | POA: Diagnosis not present

## 2023-07-15 DIAGNOSIS — F32A Depression, unspecified: Secondary | ICD-10-CM | POA: Diagnosis present

## 2023-07-15 DIAGNOSIS — Z5982 Transportation insecurity: Secondary | ICD-10-CM | POA: Diagnosis not present

## 2023-07-15 DIAGNOSIS — Z1152 Encounter for screening for COVID-19: Secondary | ICD-10-CM

## 2023-07-15 DIAGNOSIS — K3189 Other diseases of stomach and duodenum: Secondary | ICD-10-CM | POA: Diagnosis not present

## 2023-07-15 DIAGNOSIS — E119 Type 2 diabetes mellitus without complications: Secondary | ICD-10-CM

## 2023-07-15 DIAGNOSIS — Z7982 Long term (current) use of aspirin: Secondary | ICD-10-CM | POA: Diagnosis not present

## 2023-07-15 DIAGNOSIS — F419 Anxiety disorder, unspecified: Secondary | ICD-10-CM | POA: Diagnosis present

## 2023-07-15 DIAGNOSIS — R1084 Generalized abdominal pain: Secondary | ICD-10-CM | POA: Diagnosis not present

## 2023-07-15 DIAGNOSIS — Z808 Family history of malignant neoplasm of other organs or systems: Secondary | ICD-10-CM

## 2023-07-15 DIAGNOSIS — K269 Duodenal ulcer, unspecified as acute or chronic, without hemorrhage or perforation: Secondary | ICD-10-CM | POA: Diagnosis not present

## 2023-07-15 DIAGNOSIS — R0602 Shortness of breath: Secondary | ICD-10-CM | POA: Diagnosis not present

## 2023-07-15 DIAGNOSIS — K219 Gastro-esophageal reflux disease without esophagitis: Secondary | ICD-10-CM | POA: Diagnosis present

## 2023-07-15 DIAGNOSIS — E1165 Type 2 diabetes mellitus with hyperglycemia: Secondary | ICD-10-CM | POA: Diagnosis not present

## 2023-07-15 DIAGNOSIS — E785 Hyperlipidemia, unspecified: Secondary | ICD-10-CM | POA: Diagnosis not present

## 2023-07-15 DIAGNOSIS — Z87891 Personal history of nicotine dependence: Secondary | ICD-10-CM

## 2023-07-15 DIAGNOSIS — R58 Hemorrhage, not elsewhere classified: Secondary | ICD-10-CM | POA: Diagnosis not present

## 2023-07-15 DIAGNOSIS — R110 Nausea: Principal | ICD-10-CM

## 2023-07-15 DIAGNOSIS — D62 Acute posthemorrhagic anemia: Secondary | ICD-10-CM | POA: Diagnosis present

## 2023-07-15 DIAGNOSIS — D3502 Benign neoplasm of left adrenal gland: Secondary | ICD-10-CM | POA: Diagnosis not present

## 2023-07-15 DIAGNOSIS — D869 Sarcoidosis, unspecified: Secondary | ICD-10-CM | POA: Diagnosis not present

## 2023-07-15 DIAGNOSIS — E1122 Type 2 diabetes mellitus with diabetic chronic kidney disease: Secondary | ICD-10-CM | POA: Diagnosis not present

## 2023-07-15 DIAGNOSIS — H548 Legal blindness, as defined in USA: Secondary | ICD-10-CM | POA: Diagnosis not present

## 2023-07-15 DIAGNOSIS — R109 Unspecified abdominal pain: Secondary | ICD-10-CM

## 2023-07-15 DIAGNOSIS — Z803 Family history of malignant neoplasm of breast: Secondary | ICD-10-CM

## 2023-07-15 DIAGNOSIS — K264 Chronic or unspecified duodenal ulcer with hemorrhage: Secondary | ICD-10-CM | POA: Diagnosis not present

## 2023-07-15 DIAGNOSIS — Z79899 Other long term (current) drug therapy: Secondary | ICD-10-CM | POA: Diagnosis not present

## 2023-07-15 DIAGNOSIS — K209 Esophagitis, unspecified without bleeding: Secondary | ICD-10-CM | POA: Diagnosis not present

## 2023-07-15 DIAGNOSIS — I2510 Atherosclerotic heart disease of native coronary artery without angina pectoris: Secondary | ICD-10-CM | POA: Diagnosis present

## 2023-07-15 DIAGNOSIS — Z821 Family history of blindness and visual loss: Secondary | ICD-10-CM

## 2023-07-15 DIAGNOSIS — K2100 Gastro-esophageal reflux disease with esophagitis, without bleeding: Secondary | ICD-10-CM | POA: Diagnosis present

## 2023-07-15 DIAGNOSIS — N183 Chronic kidney disease, stage 3 unspecified: Secondary | ICD-10-CM | POA: Diagnosis present

## 2023-07-15 DIAGNOSIS — H409 Unspecified glaucoma: Secondary | ICD-10-CM | POA: Diagnosis not present

## 2023-07-15 DIAGNOSIS — S2242XA Multiple fractures of ribs, left side, initial encounter for closed fracture: Secondary | ICD-10-CM | POA: Diagnosis not present

## 2023-07-15 DIAGNOSIS — R42 Dizziness and giddiness: Secondary | ICD-10-CM

## 2023-07-15 DIAGNOSIS — Z902 Acquired absence of lung [part of]: Secondary | ICD-10-CM

## 2023-07-15 DIAGNOSIS — Z85118 Personal history of other malignant neoplasm of bronchus and lung: Secondary | ICD-10-CM

## 2023-07-15 DIAGNOSIS — N1831 Chronic kidney disease, stage 3a: Secondary | ICD-10-CM | POA: Diagnosis present

## 2023-07-15 DIAGNOSIS — K921 Melena: Secondary | ICD-10-CM | POA: Diagnosis not present

## 2023-07-15 DIAGNOSIS — R5383 Other fatigue: Secondary | ICD-10-CM

## 2023-07-15 DIAGNOSIS — E039 Hypothyroidism, unspecified: Secondary | ICD-10-CM | POA: Diagnosis present

## 2023-07-15 DIAGNOSIS — K449 Diaphragmatic hernia without obstruction or gangrene: Secondary | ICD-10-CM | POA: Diagnosis not present

## 2023-07-15 DIAGNOSIS — H3093 Unspecified chorioretinal inflammation, bilateral: Secondary | ICD-10-CM | POA: Diagnosis present

## 2023-07-15 DIAGNOSIS — K222 Esophageal obstruction: Secondary | ICD-10-CM

## 2023-07-15 DIAGNOSIS — H5440 Blindness, one eye, unspecified eye: Secondary | ICD-10-CM | POA: Diagnosis present

## 2023-07-15 DIAGNOSIS — H3530 Unspecified macular degeneration: Secondary | ICD-10-CM | POA: Diagnosis present

## 2023-07-15 DIAGNOSIS — R932 Abnormal findings on diagnostic imaging of liver and biliary tract: Secondary | ICD-10-CM | POA: Diagnosis not present

## 2023-07-15 DIAGNOSIS — R112 Nausea with vomiting, unspecified: Secondary | ICD-10-CM | POA: Diagnosis not present

## 2023-07-15 DIAGNOSIS — Z79890 Hormone replacement therapy (postmenopausal): Secondary | ICD-10-CM

## 2023-07-15 DIAGNOSIS — K5730 Diverticulosis of large intestine without perforation or abscess without bleeding: Secondary | ICD-10-CM | POA: Diagnosis not present

## 2023-07-15 DIAGNOSIS — K922 Gastrointestinal hemorrhage, unspecified: Secondary | ICD-10-CM | POA: Diagnosis present

## 2023-07-15 DIAGNOSIS — R1310 Dysphagia, unspecified: Secondary | ICD-10-CM | POA: Diagnosis not present

## 2023-07-15 LAB — CBC WITH DIFFERENTIAL/PLATELET
Abs Immature Granulocytes: 0.1 10*3/uL — ABNORMAL HIGH (ref 0.00–0.07)
Basophils Absolute: 0 10*3/uL (ref 0.0–0.1)
Basophils Relative: 0 %
Eosinophils Absolute: 0 10*3/uL (ref 0.0–0.5)
Eosinophils Relative: 0 %
HCT: 26 % — ABNORMAL LOW (ref 36.0–46.0)
Hemoglobin: 8.5 g/dL — ABNORMAL LOW (ref 12.0–15.0)
Immature Granulocytes: 1 %
Lymphocytes Relative: 21 %
Lymphs Abs: 2.8 10*3/uL (ref 0.7–4.0)
MCH: 30.5 pg (ref 26.0–34.0)
MCHC: 32.7 g/dL (ref 30.0–36.0)
MCV: 93.2 fL (ref 80.0–100.0)
Monocytes Absolute: 0.6 10*3/uL (ref 0.1–1.0)
Monocytes Relative: 4 %
Neutro Abs: 9.9 10*3/uL — ABNORMAL HIGH (ref 1.7–7.7)
Neutrophils Relative %: 74 %
Platelets: 283 10*3/uL (ref 150–400)
RBC: 2.79 MIL/uL — ABNORMAL LOW (ref 3.87–5.11)
RDW: 14.7 % (ref 11.5–15.5)
WBC: 13.4 10*3/uL — ABNORMAL HIGH (ref 4.0–10.5)
nRBC: 0 % (ref 0.0–0.2)

## 2023-07-15 LAB — COMPREHENSIVE METABOLIC PANEL
ALT: 11 U/L (ref 0–44)
AST: 19 U/L (ref 15–41)
Albumin: 3.2 g/dL — ABNORMAL LOW (ref 3.5–5.0)
Alkaline Phosphatase: 39 U/L (ref 38–126)
Anion gap: 11 (ref 5–15)
BUN: 58 mg/dL — ABNORMAL HIGH (ref 8–23)
CO2: 23 mmol/L (ref 22–32)
Calcium: 8.7 mg/dL — ABNORMAL LOW (ref 8.9–10.3)
Chloride: 104 mmol/L (ref 98–111)
Creatinine, Ser: 1.07 mg/dL — ABNORMAL HIGH (ref 0.44–1.00)
GFR, Estimated: 50 mL/min — ABNORMAL LOW (ref >60)
Glucose, Bld: 151 mg/dL — ABNORMAL HIGH (ref 70–99)
Potassium: 3.7 mmol/L (ref 3.5–5.1)
Sodium: 138 mmol/L (ref 135–145)
Total Bilirubin: 0.6 mg/dL (ref <1.2)
Total Protein: 6.4 g/dL — ABNORMAL LOW (ref 6.5–8.1)

## 2023-07-15 LAB — PREPARE RBC (CROSSMATCH)

## 2023-07-15 LAB — ABO/RH: ABO/RH(D): A POS

## 2023-07-15 LAB — RESP PANEL BY RT-PCR (RSV, FLU A&B, COVID)  RVPGX2
Influenza A by PCR: NEGATIVE
Influenza B by PCR: NEGATIVE
Resp Syncytial Virus by PCR: NEGATIVE
SARS Coronavirus 2 by RT PCR: NEGATIVE

## 2023-07-15 LAB — LIPASE, BLOOD: Lipase: 39 U/L (ref 11–51)

## 2023-07-15 LAB — HEMOGLOBIN AND HEMATOCRIT, BLOOD
HCT: 23.5 % — ABNORMAL LOW (ref 36.0–46.0)
Hemoglobin: 7.9 g/dL — ABNORMAL LOW (ref 12.0–15.0)

## 2023-07-15 LAB — TROPONIN I (HIGH SENSITIVITY)
Troponin I (High Sensitivity): 7 ng/L (ref <18)
Troponin I (High Sensitivity): 8 ng/L (ref <18)

## 2023-07-15 LAB — LACTIC ACID, PLASMA: Lactic Acid, Venous: 0.9 mmol/L (ref 0.5–1.9)

## 2023-07-15 LAB — POC OCCULT BLOOD, ED: Fecal Occult Bld: POSITIVE — AB

## 2023-07-15 MED ORDER — PANTOPRAZOLE SODIUM 40 MG IV SOLR: 40.0000 mg | Freq: Two times a day (BID) | INTRAVENOUS | Status: DC

## 2023-07-15 MED ORDER — LEVOTHYROXINE SODIUM 100 MCG PO TABS
100.0000 ug | ORAL_TABLET | Freq: Every day | ORAL | Status: DC
  Administered 2023-07-17 – 2023-07-19 (×3): 100 ug via ORAL
  Filled 2023-07-15 (×3): qty 1

## 2023-07-15 MED ORDER — SODIUM CHLORIDE 0.9% IV SOLUTION
Freq: Once | INTRAVENOUS | Status: DC
Start: 2023-07-15 — End: 2023-07-19

## 2023-07-15 MED ORDER — IOHEXOL 350 MG/ML SOLN
100.0000 mL | Freq: Once | INTRAVENOUS | Status: AC | PRN
  Administered 2023-07-15: 100 mL via INTRAVENOUS

## 2023-07-15 MED ORDER — ACETAMINOPHEN 325 MG PO TABS: 650.0000 mg | ORAL_TABLET | Freq: Four times a day (QID) | ORAL | Status: DC | PRN

## 2023-07-15 MED ORDER — GATIFLOXACIN 0.5 % OP SOLN
1.0000 [drp] | Freq: Four times a day (QID) | OPHTHALMIC | Status: DC
  Administered 2023-07-15 – 2023-07-19 (×13): 1 [drp] via OPHTHALMIC
  Filled 2023-07-15: qty 2.5

## 2023-07-15 MED ORDER — PRAVASTATIN SODIUM 20 MG PO TABS
40.0000 mg | ORAL_TABLET | Freq: Every day | ORAL | Status: DC
  Administered 2023-07-17 – 2023-07-19 (×3): 40 mg via ORAL
  Filled 2023-07-15 (×3): qty 2

## 2023-07-15 MED ORDER — NEOMYCIN-POLYMYXIN-DEXAMETH 3.5-10000-0.1 OP OINT
TOPICAL_OINTMENT | Freq: Four times a day (QID) | OPHTHALMIC | Status: DC
  Filled 2023-07-15: qty 3.5

## 2023-07-15 MED ORDER — TIMOLOL MALEATE 0.5 % OP SOLN
1.0000 [drp] | Freq: Two times a day (BID) | OPHTHALMIC | Status: DC
  Administered 2023-07-15 – 2023-07-16 (×2): 1 [drp] via OPHTHALMIC
  Filled 2023-07-15 (×2): qty 5

## 2023-07-15 MED ORDER — CITALOPRAM HYDROBROMIDE 20 MG PO TABS
10.0000 mg | ORAL_TABLET | Freq: Every day | ORAL | Status: DC
  Administered 2023-07-17 – 2023-07-18 (×2): 10 mg via ORAL
  Filled 2023-07-15 (×3): qty 1

## 2023-07-15 MED ORDER — ACETAMINOPHEN 650 MG RE SUPP: 650.0000 mg | Freq: Four times a day (QID) | RECTAL | Status: DC | PRN

## 2023-07-15 MED ORDER — OXYCODONE-ACETAMINOPHEN 5-325 MG PO TABS
0.5000 | ORAL_TABLET | ORAL | Status: DC | PRN
  Administered 2023-07-15 – 2023-07-17 (×5): 0.5 via ORAL
  Filled 2023-07-15 (×6): qty 1

## 2023-07-15 MED ORDER — ONDANSETRON HCL 4 MG PO TABS: 4.0000 mg | ORAL_TABLET | Freq: Four times a day (QID) | ORAL | Status: DC | PRN

## 2023-07-15 MED ORDER — SODIUM CHLORIDE 0.9 % IV BOLUS
500.0000 mL | Freq: Once | INTRAVENOUS | Status: AC
  Administered 2023-07-15: 500 mL via INTRAVENOUS

## 2023-07-15 MED ORDER — PANTOPRAZOLE SODIUM 40 MG IV SOLR
40.0000 mg | INTRAVENOUS | Status: AC
Start: 2023-07-15 — End: 2023-07-15

## 2023-07-15 MED ORDER — ONDANSETRON HCL 4 MG/2ML IJ SOLN
4.0000 mg | Freq: Four times a day (QID) | INTRAMUSCULAR | Status: DC | PRN
Start: 2023-07-15 — End: 2023-07-19

## 2023-07-15 NOTE — ED Notes (Signed)
MD aware pt is extremely anxious that she will not get a "a sleeping pill and something for pain"  she says she usually takes tylenol pm at night and "a couple of motrin" when her hip goes out.

## 2023-07-15 NOTE — Plan of Care (Signed)

## 2023-07-15 NOTE — ED Provider Notes (Signed)
[place] EMERGENCY DEPARTMENT AT [facility] Provider Note   CSN: [id number] Arrival date & time: 07/15/23  1108     History  Chief Complaint  Patient presents with   Melena    Nausea    Nausea   Emesis    Danielle Newman is a 89 y.o. female.  The history is provided by the patient and medical records. No language interpreter was used.  Emesis Severity:  Moderate Timing:  Intermittent Quality:  Stomach contents Progression:  Unchanged Associated symptoms: abdominal pain   Associated symptoms: no chills, no cough, no diarrhea, no fever and no headaches        Home Medications Prior to Admission medications   Medication Sig Start Date End Date Taking? Authorizing Provider  albuterol (VENTOLIN HFA) 108 (90 Base) MCG/ACT inhaler Inhale 2 puffs into the lungs every 6 (six) hours as needed for wheezing or shortness of breath. 12/28/22   Corwin Levins, MD  AMBULATORY NON FORMULARY MEDICATION Custom shoe lifts to treat acquired leg length discrepancy. [employer]   Fax # [phone] 01/24/22   Rodolph Bong, MD  aspirin 81 MG tablet Take 81 mg by mouth daily.    [provider]  ciclopirox (PENLAC) 8 % solution Apply topically at bedtime. Apply over nail and surrounding skin. Apply daily over previous coat. After seven (7) days, may remove with alcohol and continue cycle. 03/01/23   Vivi Barrack, DPM  citalopram (CELEXA) 10 MG tablet Take 1 tablet (10 mg total) by mouth daily. 07/01/23 06/30/24  Corwin Levins, MD  cyclobenzaprine (FLEXERIL) 5 MG tablet Take 1 tablet (5 mg total) by mouth 3 (three) times daily as needed for muscle spasms. 12/08/21   Corwin Levins, MD  Ergocalciferol 10 MCG (400 UNIT) TABS Take by mouth. 10/31/20   [provider]  levothyroxine (SYNTHROID) 100 MCG tablet TAKE 1 TABLET BY MOUTH DAILY 12/11/22   Corwin Levins, MD  lovastatin (MEVACOR) 40 MG tablet TAKE 1 TABLET BY MOUTH DAILY 12/11/22   Corwin Levins, MD   neomycin-polymyxin b-dexamethasone (MAXITROL) 3.5-10000-0.1 OINT  06/20/20   [provider]  pantoprazole (PROTONIX) 40 MG tablet TAKE ONE TABLET BY MOUTH TWICE A DAY 07/09/22   Corwin Levins, MD  timolol (TIMOPTIC) 0.5 % ophthalmic solution INSTILL ONE DROP INTO [facility] TWO TIMES A DAY 02/28/22   Rankin, Alford Highland, MD  triamcinolone (NASACORT) 55 MCG/ACT AERO nasal inhaler Place 2 sprays into the nose daily. 12/28/22   Corwin Levins, MD      Allergies    Fioricet [butalbital-apap-caffeine] and Meperidine hcl    Review of Systems   Review of Systems  Constitutional:  Positive for fatigue. Negative for chills, diaphoresis and fever.  HENT:  Negative for congestion.   Eyes:  Negative for visual disturbance.  Respiratory:  Positive for chest tightness and shortness of breath. Negative for cough, wheezing and stridor.   Cardiovascular:  Positive for chest pain. Negative for palpitations.  Gastrointestinal:  Positive for abdominal pain, blood in stool, nausea and vomiting. Negative for constipation and diarrhea.  Genitourinary:  Negative for dysuria.  Musculoskeletal:  Negative for back pain and neck pain.  Neurological:  Positive for light-headedness. Negative for syncope and headaches.  Psychiatric/Behavioral:  Negative for agitation and confusion.   All other systems reviewed and are negative.   Physical Exam Updated Vital Signs Ht 5\' 7"  (1.702 m)   Wt 68 kg   SpO2 95%  BMI 23.48 kg/m  Physical Exam Vitals and nursing note reviewed.  Constitutional:      General: She is not in acute distress.    Appearance: She is well-developed. She is ill-appearing. She is not toxic-appearing or diaphoretic.  HENT:     Head: Normocephalic and atraumatic.     Nose: No congestion or rhinorrhea.     Mouth/Throat:     Mouth: Mucous membranes are dry.     Pharynx: No oropharyngeal exudate or posterior oropharyngeal erythema.  Eyes:     Extraocular Movements: Extraocular movements intact.      Conjunctiva/sclera: Conjunctivae normal.     Pupils: Pupils are equal, round, and reactive to light.  Cardiovascular:     Rate and Rhythm: Normal rate and regular rhythm.     Heart sounds: No murmur heard. Pulmonary:     Effort: Pulmonary effort is normal. No respiratory distress.     Breath sounds: Normal breath sounds. No wheezing, rhonchi or rales.  Chest:     Chest wall: No tenderness.  Abdominal:     General: Abdomen is flat.     Palpations: Abdomen is soft.     Tenderness: There is abdominal tenderness. There is no guarding or rebound.  Musculoskeletal:        General: No swelling or tenderness.     Cervical back: Neck supple. No tenderness.     Right lower leg: No edema.     Left lower leg: No edema.  Skin:    General: Skin is warm and dry.     Capillary Refill: Capillary refill takes less than 2 seconds.     Coloration: Skin is pale.     Findings: No erythema or rash.  Neurological:     General: No focal deficit present.     Mental Status: She is alert.     Sensory: No sensory deficit.     Motor: No weakness.  Psychiatric:        Mood and Affect: Mood normal.     ED Results / Procedures / Treatments   Labs (all labs ordered are listed, but only abnormal results are displayed) Labs Reviewed  CBC WITH DIFFERENTIAL/PLATELET - Abnormal; Notable for the following components:      Result Value   WBC 13.4 (*)    RBC 2.79 (*)    Hemoglobin 8.5 (*)    HCT 26.0 (*)    Neutro Abs 9.9 (*)    Abs Immature Granulocytes 0.10 (*)    All other components within normal limits  COMPREHENSIVE METABOLIC PANEL - Abnormal; Notable for the following components:   Glucose, Bld 151 (*)    BUN 58 (*)    Creatinine, Ser 1.07 (*)    Calcium 8.7 (*)    Total Protein 6.4 (*)    Albumin 3.2 (*)    GFR, Estimated 50 (*)    All other components within normal limits  POC OCCULT BLOOD, ED - Abnormal; Notable for the following components:   Fecal Occult Bld POSITIVE (*)    All other  components within normal limits  RESP PANEL BY RT-PCR (RSV, FLU A&B, COVID)  RVPGX2  LACTIC ACID, PLASMA  LIPASE, BLOOD  URINALYSIS, W/ REFLEX TO CULTURE (INFECTION SUSPECTED)  TYPE AND SCREEN  TROPONIN I (HIGH SENSITIVITY)  TROPONIN I (HIGH SENSITIVITY)    EKG EKG Interpretation Date/Time:  Monday July 15 2023 14:14:02 EST Ventricular Rate:  88 PR Interval:  152 QRS Duration:  83 QT Interval:  368 QTC Calculation:  446 R Axis:   -12  Text Interpretation: Sinus rhythm Multiple premature complexes, vent & supraven RSR' in V1 or V2, probably normal variant no prior ECG for comparison No STEMI Confirmed by Theda Belfast ([id number]) on 07/15/2023 2:21:43 PM  Radiology CT ABDOMEN PELVIS W CONTRAST Result Date: 07/15/2023 CLINICAL DATA:  High probability pulmonary embolism. EXAM: CT ANGIOGRAPHY CHEST CT ABDOMEN AND PELVIS WITH CONTRAST TECHNIQUE: Multidetector CT imaging of the chest was performed using the standard protocol during bolus administration of intravenous contrast. Multiplanar CT image reconstructions and MIPs were obtained to evaluate the vascular anatomy. Multidetector CT imaging of the abdomen and pelvis was performed using the standard protocol during bolus administration of intravenous contrast. RADIATION DOSE REDUCTION: This exam was performed according to the departmental dose-optimization program which includes automated exposure control, adjustment of the mA and/or kV according to patient size and/or use of iterative reconstruction technique. CONTRAST:  OMNIPAQUE IOHEXOL 350 MG/ML SOLN COMPARISON:  None Available. FINDINGS: CTA CHEST FINDINGS Cardiovascular: No filling defects within the pulmonary arteries to suggest acute pulmonary embolism. Mediastinum/Nodes: No axillary or supraclavicular adenopathy. No mediastinal or hilar adenopathy. No pericardial fluid. Esophagus normal. Lungs/Pleura: Interstitial thickening peribronchial nodularity in the upper lobes suggest  chronic pulmonary infection. Similar findings in the RIGHT lower lobe. Nodules measure up to 12 mm in RIGHT lower lobe (image 87/series 12) these findings were subtly present on comparison CT from 2008. Significant progression in the interval. Musculoskeletal: Multiple healed LEFT rib fractures. Acute findings. CT ABDOMEN AND PELVIS FINDINGS Hepatobiliary: No focal hepatic lesion. No biliary ductal dilatation. Gallbladder is normal. Common bile duct is normal. Pancreas: Pancreas is normal. No ductal dilatation. No pancreatic inflammation. Spleen: Low-density 2.5 cm lesion in the spleen is favored benign cysts or hemangioma. Adrenals/urinary tract: enlargement of the LEFT adrenal gland to 16 mm. These enlarged gland has low density on the CT of the chest consistent benign adenoma. RIGHT adrenal gland normal. Several benign low-density cyst of the LEFT or RIGHT kidney. No follow-up recommended for benign renal lesion. No obstructive uropathy. Ureters and bladder normal. Stomach/Bowel: Small hiatal hernia. Stomach, small bowel, appendix, and cecum are normal. Multiple diverticula of the descending colon and sigmoid colon without acute inflammation. Vascular/Lymphatic: Abdominal aorta is normal caliber with atherosclerotic calcification. There is no retroperitoneal or periportal lymphadenopathy. No pelvic lymphadenopathy. Reproductive: Uterus and adnexa unremarkable. Other: No free fluid. Musculoskeletal: No aggressive osseous lesion. Review of the MIP images confirms the above findings. IMPRESSION: CHEST: 1. No evidence acute pulmonary embolism. 2. Interstitial thickening and peribronchial nodularity in the upper lobes and RIGHT and lower lobe suggest chronic pulmonary infection (e.g. MIA). Nodules measure up to 12 mm in the RIGHT lower lobe. Subtle findings in the RIGHT lung on CT from 2008 with interval progression PELVIS: 1. No acute findings in the abdomen pelvis. 2. Diverticulosis without diverticulitis. 3. Benign  LEFT adrenal adenoma. 4.  Aortic Atherosclerosis (ICD10-I70.0). Electronically Signed   By: Genevive Bi M.D.   On: 07/15/2023 16:58   CT Angio Chest PE W and/or Wo Contrast Result Date: 07/15/2023 CLINICAL DATA:  High probability pulmonary embolism. EXAM: CT ANGIOGRAPHY CHEST CT ABDOMEN AND PELVIS WITH CONTRAST TECHNIQUE: Multidetector CT imaging of the chest was performed using the standard protocol during bolus administration of intravenous contrast. Multiplanar CT image reconstructions and MIPs were obtained to evaluate the vascular anatomy. Multidetector CT imaging of the abdomen and pelvis was performed using the standard protocol during bolus administration of intravenous contrast. RADIATION DOSE REDUCTION: This exam  was performed according to the departmental dose-optimization program which includes automated exposure control, adjustment of the mA and/or kV according to patient size and/or use of iterative reconstruction technique. CONTRAST:  OMNIPAQUE IOHEXOL 350 MG/ML SOLN COMPARISON:  None Available. FINDINGS: CTA CHEST FINDINGS Cardiovascular: No filling defects within the pulmonary arteries to suggest acute pulmonary embolism. Mediastinum/Nodes: No axillary or supraclavicular adenopathy. No mediastinal or hilar adenopathy. No pericardial fluid. Esophagus normal. Lungs/Pleura: Interstitial thickening peribronchial nodularity in the upper lobes suggest chronic pulmonary infection. Similar findings in the RIGHT lower lobe. Nodules measure up to 12 mm in RIGHT lower lobe (image 87/series 12) these findings were subtly present on comparison CT from 2008. Significant progression in the interval. Musculoskeletal: Multiple healed LEFT rib fractures. Acute findings. CT ABDOMEN AND PELVIS FINDINGS Hepatobiliary: No focal hepatic lesion. No biliary ductal dilatation. Gallbladder is normal. Common bile duct is normal. Pancreas: Pancreas is normal. No ductal dilatation. No pancreatic inflammation.  Spleen: Low-density 2.5 cm lesion in the spleen is favored benign cysts or hemangioma. Adrenals/urinary tract: enlargement of the LEFT adrenal gland to 16 mm. These enlarged gland has low density on the CT of the chest consistent benign adenoma. RIGHT adrenal gland normal. Several benign low-density cyst of the LEFT or RIGHT kidney. No follow-up recommended for benign renal lesion. No obstructive uropathy. Ureters and bladder normal. Stomach/Bowel: Small hiatal hernia. Stomach, small bowel, appendix, and cecum are normal. Multiple diverticula of the descending colon and sigmoid colon without acute inflammation. Vascular/Lymphatic: Abdominal aorta is normal caliber with atherosclerotic calcification. There is no retroperitoneal or periportal lymphadenopathy. No pelvic lymphadenopathy. Reproductive: Uterus and adnexa unremarkable. Other: No free fluid. Musculoskeletal: No aggressive osseous lesion. Review of the MIP images confirms the above findings. IMPRESSION: CHEST: 1. No evidence acute pulmonary embolism. 2. Interstitial thickening and peribronchial nodularity in the upper lobes and RIGHT and lower lobe suggest chronic pulmonary infection (e.g. MIA). Nodules measure up to 12 mm in the RIGHT lower lobe. Subtle findings in the RIGHT lung on CT from 2008 with interval progression PELVIS: 1. No acute findings in the abdomen pelvis. 2. Diverticulosis without diverticulitis. 3. Benign LEFT adrenal adenoma. 4.  Aortic Atherosclerosis (ICD10-I70.0). Electronically Signed   By: Genevive Bi M.D.   On: 07/15/2023 16:58    Procedures Procedures    Medications Ordered in ED Medications  sodium chloride 0.9 % bolus 500 mL (has no administration in time range)  iohexol (OMNIPAQUE) 350 MG/ML injection 100 mL (100 mLs Intravenous Contrast Given 07/15/23 1534)    ED Course/ Medical Decision Making/ A&P                                 Medical Decision Making Amount and/or Complexity of Data Reviewed Labs:  ordered. Radiology: ordered.  Risk Prescription drug management.    KINDY MISURACA is a 89 y.o. 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  According to patient, she has been having a significant amount of eye pain chronically but recently had a procedure done several days ago help with the eye pain.  After the pain improved her nausea had gotten better so she ate a meal last night with manage status and chicken pot pie.  She  reports it went down well but then overnight woke up with pain in her abdomen, nausea, dry heaving and chest pain.  She reports shortness of breath with it.  She reports her abdomen was not distended but was hurting all over her abdomen.  She had black tarry stools today that looked bloody.  Denies history of rectal bleeding.  Denies history of trauma.  Denies any constipation or diarrhea.  Reports she was also urinating on herself with it.  Denies dysuria.   On exam, abdomen is diffusely tender.  Chest is nontender but oxygen saturation was around 90% on room air which is low for the patient.  She was placed on 2 L nasal cannula by EMS which she remains on now.  Lungs were clear.  Chest is nontender.  No murmur.  Back nontender.  Extremities nonedematous and nontender.  Given the patient's chest pain and shortness of breath, will get troponin EKG and imaging of the chest.  As she is having oxygen saturations lower than normal, did briefly have tachycardia while assessing patient, will get a CT PE study get a better look on this postsurgical chest.  Will get a CT abdomen pelvis as well to look for ulcer, perforation or bowel obstruction.  Family did report for her eye pain she has been taking  Motrin but she is never had gastric ulcers.  She was to see a GI doctor at some point but has not yet done so.  Will get a fecal occult test although family reported it was very clearly tarry and bloody on the stool today.  Will get urinalysis given the abdominal pain and get other labs.  Will get a type and screen.  Anticipate reassessment after workup returns.   4:21 PM Rectal exam did show evidence of gross blood and black tarry stool.  Suspect occult test will be positive.   Patient's workup began to return.  Hemoglobin has dropped to 8.5 down from 12 previously.  Otherwise mild leukocytosis.  Metabolic panel seems similar to prior.  Troponin negative.  Lactic as normal.  Lipase normal.  Did not have COVID flu RSV.  She will wait for the results of her CT chest/ab/pelvis but after this anticipation is admission for gross bleeding, fatigue, shortness of breath, and GI bleed.  She will need admission when images are back.  5:03 PM CT scans returned without evidence of acute diverticulitis.  No pulmonary embolism but does like she has chronic lung infection.  Will discuss with medicine they would like antibiotics or not as it appears more chronic.  Will admit for hemoglobin monitoring and gentle rehydration.          Final Clinical Impression(s) / ED Diagnoses Final diagnoses:  Nausea  Melena  Abdominal pain, unspecified abdominal location  Shortness of breath  Fatigue, unspecified type  Lightheadedness    Clinical Impression: 1. Nausea   2. Melena   3. Abdominal pain, unspecified abdominal location   4. Shortness of breath   5. Fatigue, unspecified type   6. Lightheadedness     Disposition: Admit  This note was prepared with assistance of Conservation officer, historic buildings. Occasional wrong-word or sound-a-like substitutions may have occurred due to the inherent limitations of voice recognition software.     Lyana Asbill, Canary Brim, MD 07/15/23 [phone]

## 2023-07-15 NOTE — ED Triage Notes (Signed)
For the last day noticed tarry stools and N/v today the daughter called EMS because the pt started to become weak denies coffee ground emesis pt states stools for the last 24 hours  alert and oriented oxygen dropped to 90 in route placed on 2L  hx of lobectomy per ems

## 2023-07-15 NOTE — ED Notes (Signed)
ED TO INPATIENT HANDOFF REPORT  ED Nurse Name and Phone #:   S Name/Age/Gender Danielle Newman 89 y.o. female Room/Bed: WA08/WA08  Code Status   Code Status: Not on file  Home/SNF/Other Home Patient oriented to: self, place, time, and situation Is this baseline? Yes   Triage Complete: Triage complete  Chief Complaint Gastrointestinal hemorrhage with melena [K92.1]  Triage Note For the last day noticed tarry stools and N/v today the daughter called EMS because the pt started to become weak denies coffee ground emesis pt states stools for the last 24 hours  alert and oriented oxygen dropped to 90 in route placed on 2L  hx of lobectomy per ems   Allergies Allergies  Allergen Reactions   Fioricet [Butalbital-Apap-Caffeine] Other (See Comments)    confusion   Meperidine Hcl     Level of Care/Admitting Diagnosis ED Disposition     ED Disposition  Admit   Condition  --   Comment  Hospital Area: [facility] [[id number]]  Level of Care: Stepdown [14]  Admit to SDU based on following criteria: Hemodynamic compromise or significant risk of instability:  Patient requiring short term acute titration and management of vasoactive drips, and invasive monitoring (i.e., CVP and Arterial line).  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; since 2008   CORONARY ARTERY DISEASE 02/11/2008   DEPRESSION 02/11/2008   DM (diabetes mellitus) (HCC) 11/21/2011   DYSPNEA 09/11/2010   GERD  02/11/2008   HYPERLIPIDEMIA 02/11/2008   HYPOTHYROIDISM 02/11/2008   Impaired glucose tolerance 11/20/2011   LUNG CANCER, HX OF 02/11/2008   MACULAR DEGENERATION 03/29/2009   OSTEOPENIA 02/11/2008   Sarcoidosis 02/11/2008   Sight impaired 04/05/2011   Past Surgical History:  Procedure Laterality Date   s/p arm surgury  05/29/1994   s/p left upper lobectomy       A IV Location/Drains/Wounds Patient Lines/Drains/Airways Status     Active Line/Drains/Airways     Name Placement date Placement time Site Days   Peripheral IV 07/15/23 20 G Right Antecubital 07/15/23  1442  Antecubital  less than 1            Intake/Output Last 24 hours No intake or output data in the 24 hours ending 07/15/23 1734  Labs/Imaging Results for orders placed or performed during the hospital encounter of 07/15/23 (from the past 48 hours)  Resp panel by RT-PCR (RSV, Flu A&B, Covid) Anterior Nasal Swab     Status: None   Collection Time: 07/15/23  2:16 PM   Specimen: Anterior Nasal Swab  Result Value Ref Range   SARS Coronavirus 2 by RT PCR NEGATIVE NEGATIVE    Comment: (NOTE) SARS-CoV-2 target nucleic acids are NOT DETECTED.  The SARS-CoV-2 RNA is generally detectable in upper respiratory specimens during the acute phase of infection. The lowest concentration of SARS-CoV-2 viral copies this assay can detect is 138 copies/mL. A negative result does not preclude SARS-Cov-2 infection and should not be used as the sole basis for treatment or other patient  management decisions. A negative result may occur with  improper specimen collection/handling, submission of specimen other than nasopharyngeal swab, presence of viral mutation(s) within the areas targeted by this assay, and inadequate number of viral copies(<138 copies/mL). A negative result must be combined with clinical observations, patient history, and epidemiological information. The expected result is Negative.  Fact Sheet for Patients:   BloggerCourse.com  Fact Sheet for Healthcare Providers:  SeriousBroker.it  This test is no t yet approved or cleared by the [place] FDA and  has been authorized for detection and/or diagnosis of SARS-CoV-2 by FDA under an Emergency Use Authorization (EUA). This EUA will remain  in effect (meaning this test can be used) for the duration of the COVID-19 declaration under Section 564(b)(1) of the Act, 21 U.S.C.section 360bbb-3(b)(1), unless the authorization is terminated  or revoked sooner.       Influenza A by PCR NEGATIVE NEGATIVE   Influenza B by PCR NEGATIVE NEGATIVE    Comment: (NOTE) The Xpert Xpress SARS-CoV-2/FLU/RSV plus assay is intended as an aid in the diagnosis of influenza from Nasopharyngeal swab specimens and should not be used as a sole basis for treatment. Nasal washings and aspirates are unacceptable for Xpert Xpress SARS-CoV-2/FLU/RSV testing.  Fact Sheet for Patients: BloggerCourse.com  Fact Sheet for Healthcare Providers: SeriousBroker.it  This test is not yet approved or cleared by the [place] FDA and has been authorized for detection and/or diagnosis of SARS-CoV-2 by FDA under an Emergency Use Authorization (EUA). This EUA will remain in effect (meaning this test can be used) for the duration of the COVID-19 declaration under Section 564(b)(1) of the Act, 21 U.S.C. section 360bbb-3(b)(1), unless the authorization is terminated or revoked.     Resp Syncytial Virus by PCR NEGATIVE NEGATIVE    Comment: (NOTE) Fact Sheet for Patients: BloggerCourse.com  Fact Sheet for Healthcare Providers: SeriousBroker.it  This test is not yet approved or cleared by the [place] FDA and has been authorized for detection and/or diagnosis of SARS-CoV-2 by FDA under an Emergency Use Authorization (EUA).  This EUA will remain in effect (meaning this test can be used) for the duration of the COVID-19 declaration under Section 564(b)(1) of the Act, 21 U.S.C. section 360bbb-3(b)(1), unless the authorization is terminated or revoked.  Performed at [facility], [address] [place], [place] [address]   CBC with Differential     Status: Abnormal   Collection Time: 07/15/23  2:46 PM  Result Value Ref Range   WBC 13.4 (H) 4.0 - 10.5 K/uL   RBC 2.79 (L) 3.87 - 5.11 MIL/uL   Hemoglobin 8.5 (L) 12.0 - 15.0 g/dL   HCT 19.1 (L) 47.8 - 29.5 %   MCV 93.2 80.0 - 100.0 fL   MCH 30.5 26.0 - 34.0 pg   MCHC 32.7 30.0 - 36.0 g/dL   RDW 62.1 30.8 - 65.7 %   Platelets 283 150 - 400 K/uL   nRBC 0.0 0.0 - 0.2 %   Neutrophils Relative % 74 %   Neutro Abs 9.9 (H) 1.7 - 7.7 K/uL   Lymphocytes Relative 21 %   Lymphs Abs 2.8 0.7 - 4.0 K/uL   Monocytes Relative 4 %   Monocytes Absolute 0.6 0.1 - 1.0 K/uL   Eosinophils Relative 0 %   Eosinophils Absolute 0.0 0.0 - 0.5 K/uL   Basophils Relative 0 %   Basophils Absolute 0.0 0.0 - 0.1 K/uL   Immature Granulocytes 1 %   Abs Immature Granulocytes 0.10 (  H) 0.00 - 0.07 K/uL    Comment: Performed at [facility], [address], [place], [place] [address]  Comprehensive metabolic panel     Status: Abnormal   Collection Time: 07/15/23  2:46 PM  Result Value Ref Range   Sodium 138 135 - 145 mmol/L   Potassium 3.7 3.5 - 5.1 mmol/L   Chloride 104 98 - 111 mmol/L   CO2 23 22 - 32 mmol/L   Glucose, Bld 151 (H) 70 - 99 mg/dL    Comment: Glucose reference range applies only to samples taken after fasting for at least 8 hours.   BUN 58 (H) 8 - 23 mg/dL   Creatinine, Ser 8.46 (H) 0.44 - 1.00 mg/dL   Calcium 8.7 (L) 8.9 - 10.3 mg/dL   Total Protein 6.4 (L) 6.5 - 8.1 g/dL   Albumin 3.2 (L) 3.5 - 5.0 g/dL   AST 19 15 - 41 U/L   ALT 11 0 - 44 U/L   Alkaline Phosphatase 39 38 - 126 U/L   Total Bilirubin 0.6 <1.2 mg/dL   GFR,  Estimated 50 (L) >60 mL/min    Comment: (NOTE) Calculated using the CKD-EPI Creatinine Equation (2021)    Anion gap 11 5 - 15    Comment: Performed at [facility], [address] [place], [place] [address]  Troponin I (High Sensitivity)     Status: None   Collection Time: 07/15/23  2:46 PM  Result Value Ref Range   Troponin I (High Sensitivity) 7 <18 ng/L    Comment: (NOTE) Elevated high sensitivity troponin I (hsTnI) values and significant  changes across serial measurements may suggest ACS but many other  chronic and acute conditions are known to elevate hsTnI results.  Refer to the "Links" section for chest pain algorithms and additional  guidance. Performed at [facility], [address], [place], [place] [address]   Lipase, blood     Status: None   Collection Time: 07/15/23  2:46 PM  Result Value Ref Range   Lipase 39 11 - 51 U/L    Comment: Performed at [facility], [address] [place], [place] [address]  Lactic acid, plasma     Status: None   Collection Time: 07/15/23  2:47 PM  Result Value Ref Range   Lactic Acid, Venous 0.9 0.5 - 1.9 mmol/L    Comment: Performed at [facility], [address], [place], [place] [address]  Type and screen [facility]     Status: None   Collection Time: 07/15/23  3:25 PM  Result Value Ref Range   ABO/RH(D) A POS    Antibody Screen NEG    Sample Expiration      07/18/2023,2359 Performed at [facility], [address] [place], [place] [address]   POC occult blood, ED     Status: Abnormal   Collection Time: 07/15/23  4:39 PM  Result Value Ref Range   Fecal Occult Bld POSITIVE (A) NEGATIVE   CT ABDOMEN PELVIS W CONTRAST Result Date: 07/15/2023 CLINICAL DATA:  High probability pulmonary embolism. EXAM: CT ANGIOGRAPHY CHEST CT ABDOMEN AND PELVIS WITH CONTRAST TECHNIQUE: Multidetector CT imaging of the chest was  performed using the standard protocol during bolus administration of intravenous contrast. Multiplanar CT image reconstructions and MIPs were obtained to evaluate the vascular anatomy. Multidetector CT imaging of the abdomen and pelvis was performed using the standard protocol during bolus administration of intravenous  contrast. RADIATION DOSE REDUCTION: This exam was performed according to the departmental dose-optimization program which includes automated exposure control, adjustment of the mA and/or kV according to patient size and/or use of iterative reconstruction technique. CONTRAST:  OMNIPAQUE IOHEXOL 350 MG/ML SOLN COMPARISON:  None Available. FINDINGS: CTA CHEST FINDINGS Cardiovascular: No filling defects within the pulmonary arteries to suggest acute pulmonary embolism. Mediastinum/Nodes: No axillary or supraclavicular adenopathy. No mediastinal or hilar adenopathy. No pericardial fluid. Esophagus normal. Lungs/Pleura: Interstitial thickening peribronchial nodularity in the upper lobes suggest chronic pulmonary infection. Similar findings in the RIGHT lower lobe. Nodules measure up to 12 mm in RIGHT lower lobe (image 87/series 12) these findings were subtly present on comparison CT from 2008. Significant progression in the interval. Musculoskeletal: Multiple healed LEFT rib fractures. Acute findings. CT ABDOMEN AND PELVIS FINDINGS Hepatobiliary: No focal hepatic lesion. No biliary ductal dilatation. Gallbladder is normal. Common bile duct is normal. Pancreas: Pancreas is normal. No ductal dilatation. No pancreatic inflammation. Spleen: Low-density 2.5 cm lesion in the spleen is favored benign cysts or hemangioma. Adrenals/urinary tract: enlargement of the LEFT adrenal gland to 16 mm. These enlarged gland has low density on the CT of the chest consistent benign adenoma. RIGHT adrenal gland normal. Several benign low-density cyst of the LEFT or RIGHT kidney. No follow-up recommended for benign renal  lesion. No obstructive uropathy. Ureters and bladder normal. Stomach/Bowel: Small hiatal hernia. Stomach, small bowel, appendix, and cecum are normal. Multiple diverticula of the descending colon and sigmoid colon without acute inflammation. Vascular/Lymphatic: Abdominal aorta is normal caliber with atherosclerotic calcification. There is no retroperitoneal or periportal lymphadenopathy. No pelvic lymphadenopathy. Reproductive: Uterus and adnexa unremarkable. Other: No free fluid. Musculoskeletal: No aggressive osseous lesion. Review of the MIP images confirms the above findings. IMPRESSION: CHEST: 1. No evidence acute pulmonary embolism. 2. Interstitial thickening and peribronchial nodularity in the upper lobes and RIGHT and lower lobe suggest chronic pulmonary infection (e.g. MIA). Nodules measure up to 12 mm in the RIGHT lower lobe. Subtle findings in the RIGHT lung on CT from 2008 with interval progression PELVIS: 1. No acute findings in the abdomen pelvis. 2. Diverticulosis without diverticulitis. 3. Benign LEFT adrenal adenoma. 4.  Aortic Atherosclerosis (ICD10-I70.0). Electronically Signed   By: Genevive Bi M.D.   On: 07/15/2023 16:58   CT Angio Chest PE W and/or Wo Contrast Result Date: 07/15/2023 CLINICAL DATA:  High probability pulmonary embolism. EXAM: CT ANGIOGRAPHY CHEST CT ABDOMEN AND PELVIS WITH CONTRAST TECHNIQUE: Multidetector CT imaging of the chest was performed using the standard protocol during bolus administration of intravenous contrast. Multiplanar CT image reconstructions and MIPs were obtained to evaluate the vascular anatomy. Multidetector CT imaging of the abdomen and pelvis was performed using the standard protocol during bolus administration of intravenous contrast. RADIATION DOSE REDUCTION: This exam was performed according to the departmental dose-optimization program which includes automated exposure control, adjustment of the mA and/or kV according to patient size and/or  use of iterative reconstruction technique. CONTRAST:  OMNIPAQUE IOHEXOL 350 MG/ML SOLN COMPARISON:  None Available. FINDINGS: CTA CHEST FINDINGS Cardiovascular: No filling defects within the pulmonary arteries to suggest acute pulmonary embolism. Mediastinum/Nodes: No axillary or supraclavicular adenopathy. No mediastinal or hilar adenopathy. No pericardial fluid. Esophagus normal. Lungs/Pleura: Interstitial thickening peribronchial nodularity in the upper lobes suggest chronic pulmonary infection. Similar findings in the RIGHT lower lobe. Nodules measure up to 12 mm in RIGHT lower lobe (image 87/series 12) these findings were subtly present on comparison CT from 2008. Significant progression  in the interval. Musculoskeletal: Multiple healed LEFT rib fractures. Acute findings. CT ABDOMEN AND PELVIS FINDINGS Hepatobiliary: No focal hepatic lesion. No biliary ductal dilatation. Gallbladder is normal. Common bile duct is normal. Pancreas: Pancreas is normal. No ductal dilatation. No pancreatic inflammation. Spleen: Low-density 2.5 cm lesion in the spleen is favored benign cysts or hemangioma. Adrenals/urinary tract: enlargement of the LEFT adrenal gland to 16 mm. These enlarged gland has low density on the CT of the chest consistent benign adenoma. RIGHT adrenal gland normal. Several benign low-density cyst of the LEFT or RIGHT kidney. No follow-up recommended for benign renal lesion. No obstructive uropathy. Ureters and bladder normal. Stomach/Bowel: Small hiatal hernia. Stomach, small bowel, appendix, and cecum are normal. Multiple diverticula of the descending colon and sigmoid colon without acute inflammation. Vascular/Lymphatic: Abdominal aorta is normal caliber with atherosclerotic calcification. There is no retroperitoneal or periportal lymphadenopathy. No pelvic lymphadenopathy. Reproductive: Uterus and adnexa unremarkable. Other: No free fluid. Musculoskeletal: No aggressive osseous lesion. Review of the  MIP images confirms the above findings. IMPRESSION: CHEST: 1. No evidence acute pulmonary embolism. 2. Interstitial thickening and peribronchial nodularity in the upper lobes and RIGHT and lower lobe suggest chronic pulmonary infection (e.g. MIA). Nodules measure up to 12 mm in the RIGHT lower lobe. Subtle findings in the RIGHT lung on CT from 2008 with interval progression PELVIS: 1. No acute findings in the abdomen pelvis. 2. Diverticulosis without diverticulitis. 3. Benign LEFT adrenal adenoma. 4.  Aortic Atherosclerosis (ICD10-I70.0). Electronically Signed   By: Genevive Bi M.D.   On: 07/15/2023 16:58    Pending Labs Unresulted Labs (From admission, onward)     Start     Ordered   07/15/23 2200  Hemoglobin and hematocrit, blood  Once-Timed,   TIMED        07/15/23 1732   07/15/23 1800  Hemoglobin and hematocrit, blood  Once-Timed,   TIMED        07/15/23 1732   07/15/23 1217  Urinalysis, w/ Reflex to Culture (Infection Suspected) -Urine, Clean Catch  Once,   URGENT       Question:  Specimen Source  Answer:  Urine, Clean Catch   07/15/23 1216            Vitals/Pain Today's Vitals   07/15/23 1645 07/15/23 1700 07/15/23 1715 07/15/23 1730  BP: 121/67 127/66 130/64 132/64  Pulse: 97 90 90 96  Resp: (!) 21 (!) 22 19 (!) 21  Temp:      TempSrc:      SpO2: 98% 98% 95% 100%  Weight:      Height:        Isolation Precautions No active isolations  Medications Medications  sodium chloride 0.9 % bolus 500 mL (has no administration in time range)  iohexol (OMNIPAQUE) 350 MG/ML injection 100 mL (100 mLs Intravenous Contrast Given 07/15/23 1534)    Mobility walks with device     Focused Assessments Cardiac Assessment Handoff:  Cardiac Rhythm: Normal sinus rhythm, Sinus tachycardia No results found for: "CKTOTAL", "CKMB", "CKMBINDEX", "TROPONINI" No results found for: "DDIMER" Does the Patient currently have chest pain? No    R Recommendations: See Admitting Provider  Note  Report given to:   Additional Notes: pt lives with daughter alert oriented walks stated black tarry stools on hemoccult it appeared more bloody.  Very gassy and it's odor is of a GI bleed.  She has some vision issues she can open her eyes on command and spontaneously just does not do it  much

## 2023-07-15 NOTE — H&P (Signed)
History and Physical    Patient: Danielle Newman MVH:[id number] DOB: August 07, 1931 DOA: 07/15/2023 DOS: the patient was seen and examined on 07/15/2023 PCP: Corwin Levins, MD  Patient coming from: Home  Chief Complaint:  Chief Complaint  Patient presents with   Melena    Nausea    Nausea   Emesis   HPI: Danielle Newman is a 89 y.o. female with medical history significant of allergic rhinitis, anxiety, depression, right eye blindness, macular degeneration, CAD, type 2 diabetes, dyspnea, GERD, hyperlipidemia, hypothyroidism, prediabetes, history of lung cancer, sarcoidosis who presented to the emergency department with complaints of, nausea, an episode of non- coffee-ground emesis and melena for the last few days associated with progressively worse dyspnea and fatigue.  No chest pain, palpitations, or lower extremity edema.  She uses ibuprofen daily multiple times for joint and left thigh pain.  Lab work: Fecal occult blood was positive.  CBCs a showed a white count of 13.4, hemoglobin 8.5 g/dL platelets 841.  Normal troponin, lipase and lactic acid.  Coronavirus, influenza and RSV PCR was negative.  CMP showed normal electrolytes after calcium correction.  Glucose 151, BUN 50 a and creatinine 1.07 mg/dL.  Total protein 6.4 and albumin 3.2 g/dL.  Imaging: CT abdomen/pelvis with contrast with no evidence of acute pulmonary embolism.  There is interstitial thickening and peribronchial nodularity of the upper lobes and right and lower lobe suggest chronic pulmonary inflammation.  Nodules measuring up to 12 mm in the right lower lobe.  Subtle findings in the right lung on CT from 2 Doxinate with interval progression.  There were no acute findings in the abdomen or pelvis.  There was diverticulosis without diverticulitis.  Benign left adrenal adenoma.  Aortic atherosclerosis.   ED course: Initial vital signs were temperature 98 F, pulse 89, respiration 20, BP 110/63 mmHg and O2 sat 95% on room  air.  Review of Systems: As mentioned in the history of present illness. All other systems reviewed and are negative. 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; since 2008   CORONARY ARTERY DISEASE 02/11/2008   DEPRESSION 02/11/2008   DM (diabetes mellitus) (HCC) 11/21/2011   DYSPNEA 09/11/2010   GERD 02/11/2008   HYPERLIPIDEMIA 02/11/2008   HYPOTHYROIDISM 02/11/2008   Impaired glucose tolerance 11/20/2011   LUNG CANCER, HX OF 02/11/2008   MACULAR DEGENERATION 03/29/2009   OSTEOPENIA 02/11/2008   Sarcoidosis 02/11/2008   Sight impaired 04/05/2011   Past Surgical History:  Procedure Laterality Date   s/p arm surgury  05/29/1994   s/p left upper lobectomy     Social History:  reports that she quit smoking about 29 years ago. Her smoking use included cigarettes. She has never used smokeless tobacco. She reports that she does not drink alcohol and does not use drugs.  Allergies  Allergen Reactions   Fioricet [Butalbital-Apap-Caffeine] Other (See Comments)    confusion   Meperidine Hcl     Family History  Problem Relation Age of Onset   Cancer Mother        thyroid cancer   Allergies Daughter    Cancer Daughter        breast cancer    Prior to Admission medications   Medication Sig Start Date End Date Taking? Authorizing Provider  albuterol (VENTOLIN HFA) 108 (90 Base) MCG/ACT inhaler Inhale 2 puffs into the lungs every 6 (six) hours as needed for wheezing or  shortness of breath. 12/28/22   Corwin Levins, MD  AMBULATORY NON FORMULARY MEDICATION Custom shoe lifts to treat acquired leg length discrepancy. [employer]   Fax # [phone] 01/24/22   Rodolph Bong, MD  aspirin 81 MG tablet Take 81 mg by mouth daily.    [provider]  ciclopirox (PENLAC) 8 % solution Apply topically at bedtime. Apply over nail and surrounding skin. Apply daily over previous coat. After seven (7) days, may  remove with alcohol and continue cycle. 03/01/23   Vivi Barrack, DPM  citalopram (CELEXA) 10 MG tablet Take 1 tablet (10 mg total) by mouth daily. 07/01/23 06/30/24  Corwin Levins, MD  cyclobenzaprine (FLEXERIL) 5 MG tablet Take 1 tablet (5 mg total) by mouth 3 (three) times daily as needed for muscle spasms. 12/08/21   Corwin Levins, MD  Ergocalciferol 10 MCG (400 UNIT) TABS Take by mouth. 10/31/20   [provider]  levothyroxine (SYNTHROID) 100 MCG tablet TAKE 1 TABLET BY MOUTH DAILY 12/11/22   Corwin Levins, MD  lovastatin (MEVACOR) 40 MG tablet TAKE 1 TABLET BY MOUTH DAILY 12/11/22   Corwin Levins, MD  neomycin-polymyxin b-dexamethasone (MAXITROL) 3.5-10000-0.1 OINT  06/20/20   [provider]  pantoprazole (PROTONIX) 40 MG tablet TAKE ONE TABLET BY MOUTH TWICE A DAY 07/09/22   Corwin Levins, MD  timolol (TIMOPTIC) 0.5 % ophthalmic solution INSTILL ONE DROP INTO [facility] EYE TWO TIMES A DAY 02/28/22   Rankin, Alford Highland, MD  triamcinolone (NASACORT) 55 MCG/ACT AERO nasal inhaler Place 2 sprays into the nose daily. 12/28/22   Corwin Levins, MD    Physical Exam: Vitals:   07/15/23 1630 07/15/23 1645 07/15/23 1700 07/15/23 1715  BP: 135/69 121/67 127/66 130/64  Pulse: 90 97 90 90  Resp: (!) 22 (!) 21 (!) 22 19  Temp:      TempSrc:      SpO2: 97% 98% 98% 95%  Weight:      Height:       Physical Exam Vitals and nursing note reviewed.  Constitutional:      Appearance: Normal appearance.  HENT:     Head: Normocephalic.     Nose: No rhinorrhea.     Mouth/Throat:     Mouth: Mucous membranes are moist.  Eyes:     General: Visual field deficit present. No scleral icterus.       Right eye: Discharge present.        Left eye: Discharge present.    Conjunctiva/sclera:     Left eye: Exudate present.     Pupils: Pupils are equal, round, and reactive to light.  Cardiovascular:     Rate and Rhythm: Normal rate and regular rhythm.  Pulmonary:     Effort: Pulmonary effort is normal.      Breath sounds: Normal breath sounds.  Abdominal:     General: Bowel sounds are normal.     Palpations: Abdomen is soft.  Musculoskeletal:     Cervical back: Neck supple.     Right lower leg: No edema.     Left lower leg: No edema.  Skin:    General: Skin is warm and dry.     Coloration: Skin is pale.  Neurological:     General: No focal deficit present.     Mental Status: She is alert and oriented to person, place, and time.  Psychiatric:        Mood and Affect: Mood normal.  Behavior: Behavior normal.     Data Reviewed:  Results are pending, will review when available. EKG: Vent. rate 88 BPM PR interval 152 ms QRS duration 83 ms QT/QTcB 368/446 ms P-R-T axes 8 -12 23 Sinus rhythm Multiple premature complexes, vent & supraven RSR' in V1 or V2, probably normal variant  Assessment and Plan: Principal Problem:   ABLA (acute blood loss anemia) In the setting of:   Gastrointestinal hemorrhage with melena Admit to stepdown/inpatient. Clear liquid diet Keep NPO after midnight. Continue high-dose pantoprazole. Monitor H&H. Transfuse as needed. Advised to cease ibuprofen use. GI consult will evaluate in the morning.  Active Problems:   DM (diabetes mellitus) (HCC) Diet controlled. Currently on clear liquids. CBG monitoring 4 times daily. Last hemoglobin A1c was 5.5% 2 months ago.    Hypothyroidism Continue levothyroxine 100 mcg p.o. daily.    Hyperlipidemia Continue lovastatin 40 mg p.o. daily or formulary equivalent.    Depression Continue escitalopram 10 mg p.o. daily.    Coronary atherosclerosis Hold aspirin. Continue statin.    GERD On parenteral PPI as above.    CKD (chronic kidney disease) stage 3, GFR 30-59 ml/min Monitor renal function and electrolytes.    Choroiditis of both eyes   Blind right eye Continue alternating 4 times daily antibiotic eyedrops on left eye. Continue timolol drops in both eyes twice daily. Follow-up with  ophthalmology as an outpatient.    Advance Care Planning:   Code Status: Full Code   Consults: [facility] gastro[facility]ology Yancey Flemings, MD).  Family Communication: Her daughter was at bedside.  Severity of Illness: The appropriate patient status for this patient is OBSERVATION. Observation status is judged to be reasonable and necessary in order to provide the required intensity of service to ensure the patient's safety. The patient's presenting symptoms, physical exam findings, and initial radiographic and laboratory data in the context of their medical condition is felt to place them at decreased risk for further clinical deterioration. Furthermore, it is anticipated that the patient will be medically stable for discharge from the hospital within 2 midnights of admission.   Author: Bobette Mo, MD 07/15/2023 5:32 PM  For on call review [email or web address].   This document was prepared using Dragon voice recognition software and may contain some unintended transcription errors.

## 2023-07-16 ENCOUNTER — Encounter (HOSPITAL_COMMUNITY)
Admission: EM | Disposition: A | Discharge status: Home Health | Payer: Self-pay | Source: Home / Self Care | Attending: Family Medicine

## 2023-07-16 ENCOUNTER — Observation Stay (HOSPITAL_COMMUNITY): Payer: Medicare Other | Admitting: Certified Registered Nurse Anesthetist

## 2023-07-16 ENCOUNTER — Encounter (HOSPITAL_COMMUNITY): Payer: Self-pay | Admitting: Internal Medicine

## 2023-07-16 DIAGNOSIS — R109 Unspecified abdominal pain: Secondary | ICD-10-CM | POA: Diagnosis not present

## 2023-07-16 DIAGNOSIS — E1165 Type 2 diabetes mellitus with hyperglycemia: Secondary | ICD-10-CM | POA: Diagnosis not present

## 2023-07-16 DIAGNOSIS — E039 Hypothyroidism, unspecified: Secondary | ICD-10-CM | POA: Diagnosis not present

## 2023-07-16 DIAGNOSIS — K3189 Other diseases of stomach and duodenum: Secondary | ICD-10-CM | POA: Diagnosis not present

## 2023-07-16 DIAGNOSIS — K222 Esophageal obstruction: Secondary | ICD-10-CM

## 2023-07-16 DIAGNOSIS — Z5982 Transportation insecurity: Secondary | ICD-10-CM | POA: Diagnosis not present

## 2023-07-16 DIAGNOSIS — I2510 Atherosclerotic heart disease of native coronary artery without angina pectoris: Secondary | ICD-10-CM | POA: Diagnosis present

## 2023-07-16 DIAGNOSIS — D62 Acute posthemorrhagic anemia: Secondary | ICD-10-CM | POA: Diagnosis not present

## 2023-07-16 DIAGNOSIS — H3093 Unspecified chorioretinal inflammation, bilateral: Secondary | ICD-10-CM | POA: Diagnosis not present

## 2023-07-16 DIAGNOSIS — E785 Hyperlipidemia, unspecified: Secondary | ICD-10-CM | POA: Diagnosis present

## 2023-07-16 DIAGNOSIS — K921 Melena: Secondary | ICD-10-CM | POA: Diagnosis not present

## 2023-07-16 DIAGNOSIS — R5383 Other fatigue: Secondary | ICD-10-CM | POA: Diagnosis not present

## 2023-07-16 DIAGNOSIS — K922 Gastrointestinal hemorrhage, unspecified: Secondary | ICD-10-CM | POA: Diagnosis present

## 2023-07-16 DIAGNOSIS — K2951 Unspecified chronic gastritis with bleeding: Secondary | ICD-10-CM | POA: Diagnosis not present

## 2023-07-16 DIAGNOSIS — K319 Disease of stomach and duodenum, unspecified: Secondary | ICD-10-CM | POA: Diagnosis not present

## 2023-07-16 DIAGNOSIS — R42 Dizziness and giddiness: Secondary | ICD-10-CM | POA: Diagnosis not present

## 2023-07-16 DIAGNOSIS — K264 Chronic or unspecified duodenal ulcer with hemorrhage: Secondary | ICD-10-CM | POA: Diagnosis not present

## 2023-07-16 DIAGNOSIS — I129 Hypertensive chronic kidney disease with stage 1 through stage 4 chronic kidney disease, or unspecified chronic kidney disease: Secondary | ICD-10-CM | POA: Diagnosis not present

## 2023-07-16 DIAGNOSIS — Z87891 Personal history of nicotine dependence: Secondary | ICD-10-CM | POA: Diagnosis not present

## 2023-07-16 DIAGNOSIS — K2100 Gastro-esophageal reflux disease with esophagitis, without bleeding: Secondary | ICD-10-CM | POA: Diagnosis present

## 2023-07-16 DIAGNOSIS — K449 Diaphragmatic hernia without obstruction or gangrene: Secondary | ICD-10-CM | POA: Diagnosis present

## 2023-07-16 DIAGNOSIS — H548 Legal blindness, as defined in USA: Secondary | ICD-10-CM | POA: Diagnosis present

## 2023-07-16 DIAGNOSIS — R1310 Dysphagia, unspecified: Secondary | ICD-10-CM | POA: Diagnosis not present

## 2023-07-16 DIAGNOSIS — K260 Acute duodenal ulcer with hemorrhage: Secondary | ICD-10-CM | POA: Diagnosis not present

## 2023-07-16 DIAGNOSIS — K209 Esophagitis, unspecified without bleeding: Secondary | ICD-10-CM | POA: Diagnosis not present

## 2023-07-16 DIAGNOSIS — Z79899 Other long term (current) drug therapy: Secondary | ICD-10-CM | POA: Diagnosis not present

## 2023-07-16 DIAGNOSIS — Z1152 Encounter for screening for COVID-19: Secondary | ICD-10-CM | POA: Diagnosis not present

## 2023-07-16 DIAGNOSIS — K269 Duodenal ulcer, unspecified as acute or chronic, without hemorrhage or perforation: Secondary | ICD-10-CM

## 2023-07-16 DIAGNOSIS — N1831 Chronic kidney disease, stage 3a: Secondary | ICD-10-CM | POA: Diagnosis present

## 2023-07-16 DIAGNOSIS — H409 Unspecified glaucoma: Secondary | ICD-10-CM | POA: Diagnosis present

## 2023-07-16 DIAGNOSIS — Z803 Family history of malignant neoplasm of breast: Secondary | ICD-10-CM | POA: Diagnosis not present

## 2023-07-16 DIAGNOSIS — D869 Sarcoidosis, unspecified: Secondary | ICD-10-CM | POA: Diagnosis present

## 2023-07-16 DIAGNOSIS — Z808 Family history of malignant neoplasm of other organs or systems: Secondary | ICD-10-CM | POA: Diagnosis not present

## 2023-07-16 DIAGNOSIS — E1122 Type 2 diabetes mellitus with diabetic chronic kidney disease: Secondary | ICD-10-CM | POA: Diagnosis present

## 2023-07-16 DIAGNOSIS — N183 Chronic kidney disease, stage 3 unspecified: Secondary | ICD-10-CM | POA: Diagnosis not present

## 2023-07-16 DIAGNOSIS — Z7982 Long term (current) use of aspirin: Secondary | ICD-10-CM | POA: Diagnosis not present

## 2023-07-16 DIAGNOSIS — Z79890 Hormone replacement therapy (postmenopausal): Secondary | ICD-10-CM | POA: Diagnosis not present

## 2023-07-16 DIAGNOSIS — F32A Depression, unspecified: Secondary | ICD-10-CM | POA: Diagnosis present

## 2023-07-16 HISTORY — PX: HOT HEMOSTASIS: SHX5433

## 2023-07-16 HISTORY — PX: ESOPHAGOGASTRODUODENOSCOPY (EGD) WITH PROPOFOL: SHX5813

## 2023-07-16 HISTORY — PX: BIOPSY: SHX5522

## 2023-07-16 HISTORY — PX: BALLOON DILATION: SHX5330

## 2023-07-16 HISTORY — PX: SUBMUCOSAL INJECTION: SHX5543

## 2023-07-16 HISTORY — PX: HEMOSTASIS CONTROL: SHX6838

## 2023-07-16 LAB — COMPREHENSIVE METABOLIC PANEL
ALT: 12 U/L (ref 0–44)
AST: 13 U/L — ABNORMAL LOW (ref 15–41)
Albumin: 3.2 g/dL — ABNORMAL LOW (ref 3.5–5.0)
Alkaline Phosphatase: 40 U/L (ref 38–126)
Anion gap: 8 (ref 5–15)
BUN: 52 mg/dL — ABNORMAL HIGH (ref 8–23)
CO2: 25 mmol/L (ref 22–32)
Calcium: 8.7 mg/dL — ABNORMAL LOW (ref 8.9–10.3)
Chloride: 108 mmol/L (ref 98–111)
Creatinine, Ser: 0.96 mg/dL (ref 0.44–1.00)
GFR, Estimated: 57 mL/min — ABNORMAL LOW (ref >60)
Glucose, Bld: 123 mg/dL — ABNORMAL HIGH (ref 70–99)
Potassium: 3.8 mmol/L (ref 3.5–5.1)
Sodium: 141 mmol/L (ref 135–145)
Total Bilirubin: 0.8 mg/dL (ref <1.2)
Total Protein: 6 g/dL — ABNORMAL LOW (ref 6.5–8.1)

## 2023-07-16 LAB — GLUCOSE, CAPILLARY
Glucose-Capillary: 125 mg/dL — ABNORMAL HIGH (ref 70–99)
Glucose-Capillary: 125 mg/dL — ABNORMAL HIGH (ref 70–99)
Glucose-Capillary: 127 mg/dL — ABNORMAL HIGH (ref 70–99)
Glucose-Capillary: 97 mg/dL (ref 70–99)

## 2023-07-16 LAB — URINALYSIS, W/ REFLEX TO CULTURE (INFECTION SUSPECTED)
Bilirubin Urine: NEGATIVE
Glucose, UA: NEGATIVE mg/dL
Ketones, ur: NEGATIVE mg/dL
Leukocytes,Ua: NEGATIVE
Nitrite: NEGATIVE
Protein, ur: NEGATIVE mg/dL
Specific Gravity, Urine: 1.042 — ABNORMAL HIGH (ref 1.005–1.030)
pH: 5 (ref 5.0–8.0)

## 2023-07-16 LAB — CBC
HCT: 29 % — ABNORMAL LOW (ref 36.0–46.0)
Hemoglobin: 9.3 g/dL — ABNORMAL LOW (ref 12.0–15.0)
MCH: 29.5 pg (ref 26.0–34.0)
MCHC: 32.1 g/dL (ref 30.0–36.0)
MCV: 92.1 fL (ref 80.0–100.0)
Platelets: 255 10*3/uL (ref 150–400)
RBC: 3.15 MIL/uL — ABNORMAL LOW (ref 3.87–5.11)
RDW: 16.6 % — ABNORMAL HIGH (ref 11.5–15.5)
WBC: 15.1 10*3/uL — ABNORMAL HIGH (ref 4.0–10.5)
nRBC: 0 % (ref 0.0–0.2)

## 2023-07-16 LAB — HEMOGLOBIN AND HEMATOCRIT, BLOOD
HCT: 26.9 % — ABNORMAL LOW (ref 36.0–46.0)
HCT: 25.6 % — ABNORMAL LOW (ref 36.0–46.0)
Hemoglobin: 8.9 g/dL — ABNORMAL LOW (ref 12.0–15.0)
Hemoglobin: 8.1 g/dL — ABNORMAL LOW (ref 12.0–15.0)

## 2023-07-16 SURGERY — ESOPHAGOGASTRODUODENOSCOPY (EGD) WITH PROPOFOL
Anesthesia: Monitor Anesthesia Care

## 2023-07-16 MED ORDER — PROPOFOL 500 MG/50ML IV EMUL
INTRAVENOUS | Status: DC | PRN
  Administered 2023-07-16: 125 ug/kg/min via INTRAVENOUS

## 2023-07-16 MED ORDER — EPINEPHRINE 1 MG/10ML IJ SOSY
PREFILLED_SYRINGE | INTRAMUSCULAR | Status: AC
Start: 2023-07-16 — End: ?
  Filled 2023-07-16: qty 10

## 2023-07-16 MED ORDER — NEOMYCIN-POLYMYXIN-DEXAMETH 3.5-10000-0.1 OP SUSP
1.0000 [drp] | Freq: Four times a day (QID) | OPHTHALMIC | Status: DC
Start: 2023-07-16 — End: 2023-07-19
  Administered 2023-07-16 – 2023-07-19 (×11): 1 [drp] via OPHTHALMIC
  Filled 2023-07-16: qty 5

## 2023-07-16 MED ORDER — PROPOFOL 10 MG/ML IV BOLUS
INTRAVENOUS | Status: DC | PRN
  Administered 2023-07-16 (×4): 20 mg via INTRAVENOUS

## 2023-07-16 MED ORDER — CHLORHEXIDINE GLUCONATE CLOTH 2 % EX PADS
6.0000 | MEDICATED_PAD | Freq: Every day | CUTANEOUS | Status: DC
  Administered 2023-07-15 – 2023-07-18 (×3): 6 via TOPICAL

## 2023-07-16 MED ORDER — POLYVINYL ALCOHOL 1.4 % OP SOLN
1.0000 [drp] | OPHTHALMIC | Status: DC | PRN
  Administered 2023-07-16 – 2023-07-17 (×3): 1 [drp] via OPHTHALMIC
  Filled 2023-07-16: qty 15

## 2023-07-16 MED ORDER — TIMOLOL MALEATE 0.5 % OP SOLN
1.0000 [drp] | Freq: Two times a day (BID) | OPHTHALMIC | Status: DC
Start: 2023-07-16 — End: 2023-07-19
  Administered 2023-07-17 – 2023-07-19 (×5): 1 [drp] via OPHTHALMIC
  Filled 2023-07-16: qty 5

## 2023-07-16 MED ORDER — SODIUM CHLORIDE 0.9 % IV SOLN: INTRAVENOUS | Status: DC | PRN

## 2023-07-16 MED ORDER — PANTOPRAZOLE SODIUM 40 MG IV SOLR
40.0000 mg | INTRAVENOUS | Status: AC
Start: 2023-07-16 — End: 2023-07-16

## 2023-07-16 MED ORDER — EPINEPHRINE 1 MG/10ML IJ SOSY
PREFILLED_SYRINGE | INTRAMUSCULAR | Status: DC | PRN
  Administered 2023-07-16: 3.5 mL

## 2023-07-16 MED ORDER — LIDOCAINE 2% (20 MG/ML) 5 ML SYRINGE
INTRAMUSCULAR | Status: DC | PRN
  Administered 2023-07-16: 50 mg via INTRAVENOUS

## 2023-07-16 MED ORDER — PROPOFOL 1000 MG/100ML IV EMUL
INTRAVENOUS | Status: AC
  Filled 2023-07-16: qty 100

## 2023-07-16 MED ORDER — PHENYLEPHRINE 80 MCG/ML (10ML) SYRINGE FOR IV PUSH (FOR BLOOD PRESSURE SUPPORT)
PREFILLED_SYRINGE | INTRAVENOUS | Status: DC | PRN
  Administered 2023-07-16: 160 ug via INTRAVENOUS

## 2023-07-16 MED ORDER — PANTOPRAZOLE SODIUM 40 MG IV SOLR: 40.0000 mg | Freq: Two times a day (BID) | INTRAVENOUS | Status: DC

## 2023-07-16 MED ORDER — PANTOPRAZOLE SODIUM 40 MG IV SOLR
40.0000 mg | Freq: Four times a day (QID) | INTRAVENOUS | Status: DC
  Administered 2023-07-16 – 2023-07-19 (×11): 40 mg via INTRAVENOUS
  Filled 2023-07-16 (×11): qty 10

## 2023-07-16 SURGICAL SUPPLY — 14 items

## 2023-07-16 NOTE — Anesthesia Preprocedure Evaluation (Addendum)
Anesthesia Evaluation  Patient identified by MRN, date of birth, ID band Patient awake    Reviewed: Allergy & Precautions, NPO status , Patient's Chart, lab work & pertinent test results  History of Anesthesia Complications Negative for: history of anesthetic complications  Airway Mallampati: II  TM Distance: >3 FB Neck ROM: Full    Dental  (+) Dental Advisory Given, Missing, Poor Dentition   Pulmonary former smoker   Pulmonary exam normal        Cardiovascular + CAD  Normal cardiovascular exam     Neuro/Psych  Headaches PSYCHIATRIC DISORDERS Anxiety Depression       GI/Hepatic Neg liver ROS,GERD  Medicated and Controlled,,  Endo/Other  diabetes, Type 2Hypothyroidism    Renal/GU CRFRenal disease     Musculoskeletal negative musculoskeletal ROS (+)    Abdominal   Peds  Hematology  (+) Blood dyscrasia, anemia   Anesthesia Other Findings Blind right eye B/l hearing loss Sarcoidosis   Reproductive/Obstetrics                             Anesthesia Physical Anesthesia Plan  ASA: 3  Anesthesia Plan: MAC   Post-op Pain Management: Minimal or no pain anticipated   Induction:   PONV Risk Score and Plan: 2 and Propofol infusion and Treatment may vary due to age or medical condition  Airway Management Planned: Nasal Cannula and Natural Airway  Additional Equipment: None  Intra-op Plan:   Post-operative Plan:   Informed Consent: I have reviewed the patients History and Physical, chart, labs and discussed the procedure including the risks, benefits and alternatives for the proposed anesthesia with the patient or authorized representative who has indicated his/her understanding and acceptance.       Plan Discussed with: CRNA and Anesthesiologist  Anesthesia Plan Comments:         Anesthesia Quick Evaluation

## 2023-07-16 NOTE — Plan of Care (Signed)
  Problem: Nutritional: Goal: Maintenance of adequate nutrition will improve Outcome: Progressing Goal: Progress toward achieving an optimal weight will improve Outcome: Progressing   Problem: Skin Integrity: Goal: Risk for impaired skin integrity will decrease Outcome: Progressing   Problem: Tissue Perfusion: Goal: Adequacy of tissue perfusion will improve Outcome: Progressing

## 2023-07-16 NOTE — Consult Note (Addendum)
Consultation  Referring Provider:  [facility] Primary Care Physician:  Corwin Levins, MD Primary Gastroenterologist:  none  Reason for Consultation:  melena, anemia  HPI: Danielle Newman is a 89 y.o. female, with no prior GI history, no previous procedures who was awaiting appointment with [facility] after recently having been seen by Dr. Oliver Barre.  Patient presented to the emergency room brought in by EMS yesterday after onset of tarry black stools at home yesterday morning associated with nausea and dizziness. She does report that she has been taking Motrin regularly usually 2/day over the past several months for hip pain and she has been having pain in her left eye over the past week or so and had been taking additional Motrin off and on.  She had a few episodes of grossly tarry bowel movements yesterday has not had any further overnight, no vomiting. She is also been taking a baby aspirin recently. On arrival to the emergency room she was hemodynamically stable Labs show WBC of 13.4/hemoglobin 8.5/hematocrit 26.0/BUN 58/creatinine 1.07 LFTs within normal limits. CT of the abdomen and pelvis was done with contrast which showed a normal-appearing esophagus, small hiatal hernia, there is interstitial thickening and peribronchial nodularity in the upper lobes suggesting chronic pulmonary infection and similar findings in the right lower lobe, nodules measuring up to 12 mm in the right lower lobe.  Evidence of pulmonary embolus Normal-appearing liver, there is a 2.5 cm cyst in the spleen versus hemangioma and multiple diverticuli.  Patient has been started on twice daily IV PPI, hemoglobin last night down to 7.9, she was transfused 1 unit of packed RBCs and hemoglobin up to 9.3/hematocrit 29.0 this morning.  Daughter is at bedside, she says her mom is usually up and around and very independent despite being legally blind in the right eye.  She has been having issues with her left eye  recently and only has peripheral vision, she then developed pain in the eye and has been seeing an ophthalmologist, had a mesh placed this past weekend on the left eye and was to follow-up with Dr. Burnett Sheng this week. They also report that she has had 2-3 bad episodes of dysphagia over the past couple of years with what sounds like transient food impactions.  She says 1 of these was prolonged and she felt like the food sat in her esophagus for a day or 2.  Her daughter says that she has had more frequent episodes of milder dysphagia requiring coughing and regurgitation over the past several months.  She also endorses some heartburn and indigestion.  She senses the level of "blockage" in the distal esophagus.  She has also had some vague upper abdominal discomfort recently. Reviewing labs her baseline hemoglobin was 12 with hematocrit of 36.8 in May 2024. 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 Authorizing Provider  Acidophilus Lactobacillus CAPS Take 1 capsule by mouth daily.   Yes [provider]   albuterol (VENTOLIN HFA) 108 (90 Base) MCG/ACT inhaler Inhale 2 puffs into the lungs every 6 (six) hours as needed for wheezing or shortness of breath. 12/28/22  Yes Corwin Levins, MD  aspirin EC 81 MG tablet Take 81 mg by mouth daily. Swallow whole.   Yes [provider]  Cholecalciferol (VITAMIN D3) 50 MCG (2000 UT) TABS Take 2,000 Units by mouth daily.   Yes [provider]  citalopram (CELEXA) 10 MG tablet Take 1 tablet (10 mg total) by mouth daily. 07/01/23 06/30/24 Yes Corwin Levins, MD  cyclobenzaprine (FLEXERIL) 5 MG tablet Take 1 tablet (5 mg total) by mouth 3 (three) times daily as needed for muscle spasms. 12/08/21  Yes Corwin Levins, MD  levothyroxine (SYNTHROID) 100 MCG tablet TAKE 1 TABLET BY MOUTH DAILY Patient taking differently: Take 100 mcg by mouth daily before breakfast. 12/11/22  Yes Corwin Levins, MD  lovastatin (MEVACOR) 40 MG tablet TAKE 1 TABLET BY MOUTH DAILY 12/11/22  Yes Corwin Levins, MD  moxifloxacin (VIGAMOX) 0.5 % ophthalmic solution Place 1 drop into the left eye See admin instructions. Instill 1 drop into the left eye four times a day, alternating with generic Maxitrol- until the next office visit 07/13/23  Yes [provider]  neomycin-polymyxin b-dexamethasone (MAXITROL) 3.5-10000-0.1 SUSP Place 1 drop into the left eye See admin instructions. Instill 1 drop into the left eye four times a day, alternating with generic Vigamox- until the next office visit   Yes [provider]  pantoprazole (PROTONIX) 40 MG tablet TAKE ONE TABLET BY MOUTH TWICE A DAY Patient taking differently: Take 40 mg by mouth at bedtime. 07/09/22  Yes Corwin Levins, MD  REFRESH TEARS PF 0.5-0.9 % SOLN Place 1 drop into both eyes See admin instructions. Instill 1 drop into affected eye(s) up to six times a day as needed for matted appearance   Yes [provider]  timolol (TIMOPTIC) 0.5 % ophthalmic solution INSTILL ONE DROP INTO EACH EYE TWO TIMES A  DAY Patient taking differently: Place 1 drop into both eyes in the morning and at bedtime. 02/28/22  Yes Rankin, Alford Highland, MD  AMBULATORY NON FORMULARY MEDICATION Custom shoe lifts to treat acquired leg length discrepancy. [facility] [employer]   Fax # [phone] 01/24/22   Rodolph Bong, MD  ciclopirox [facility]) 8 % solution Apply topically at bedtime. Apply over nail and surrounding skin. Apply daily over previous coat. After seven (7) days, may remove with alcohol and continue cycle. Patient not taking: Reported on 07/15/2023 03/01/23   Vivi Barrack, DPM  triamcinolone (NASACORT) 55 MCG/ACT AERO nasal inhaler Place 2 sprays into the nose daily. Patient not taking: Reported on 07/15/2023 12/28/22   Corwin Levins, MD    Current Facility-Administered Medications  Medication Dose Route Frequency Provider Last Rate Last Admin   0.9 %  sodium chloride infusion (Manually program via Guardrails IV Fluids)   Intravenous Once Bobette Mo, MD       acetaminophen (TYLENOL) tablet 650 mg  650 mg Oral Q6H PRN Bobette Mo, MD       Or  acetaminophen (TYLENOL) suppository 650 mg  650 mg Rectal Q6H PRN Bobette Mo, MD       Chlorhexidine Gluconate Cloth 2 % PADS 6 each  6 each Topical Daily Bobette Mo, MD   6 each at 07/15/23 2000   citalopram (CELEXA) tablet 10 mg  10 mg Oral Daily Bobette Mo, MD       gatifloxacin (ZYMAXID) 0.5 % ophthalmic drops 1 drop  1 drop Left Eye QID Bobette Mo, MD   1 drop at 07/15/23 2158   levothyroxine (SYNTHROID) tablet 100 mcg  100 mcg Oral QAC breakfast Bobette Mo, MD       neomycin-polymyxin b-dexamethasone (MAXITROL) ophthalmic ointment   Left Eye QID Bobette Mo, MD   Given by Other at 07/15/23 2158   ondansetron [facility]) tablet 4 mg  4 mg Oral Q6H PRN Bobette Mo, MD       Or   ondansetron [facility]) injection 4 mg  4 mg Intravenous Q6H PRN Bobette Mo, MD       oxyCODONE-acetaminophen  (PERCOCET/ROXICET) 5-325 MG per tablet 0.5 tablet  0.5 tablet Oral Q4H PRN Bobette Mo, MD   0.5 tablet at 07/15/23 2237   pantoprazole (PROTONIX) injection 40 mg  40 mg Intravenous Q12H Bobette Mo, MD       pravastatin (PRAVACHOL) tablet 40 mg  40 mg Oral Daily Bobette Mo, MD       timolol (TIMOPTIC) 0.5 % ophthalmic solution 1 drop  1 drop Left Eye BID Bobette Mo, MD   1 drop at 07/15/23 2159    Allergies as of 07/15/2023 - Review Complete 07/15/2023  Allergen Reaction Noted   Fioricet [butalbital-apap-caffeine] Other (See Comments) 10/05/2014   Meperidine hcl Other (See Comments) 02/11/2008    Family History  Problem Relation Age of Onset   Cancer Mother        thyroid cancer   Allergies Daughter    Cancer Daughter        breast cancer    Social History   Socioeconomic History   Marital status: Widowed    Spouse name: Not on file   Number of children: Not on file   Years of education: Not on file   Highest education level: Associate degree: occupational, Scientist, product/process development, or vocational program  Occupational History   Occupation: retired Producer, television/film/video   Tobacco Use   Smoking status: Former    Current packs/day: 0.00    Types: Cigarettes    Quit date: 05/29/1994    Years since quitting: 29.1   Smokeless tobacco: Never  Substance and Sexual Activity   Alcohol use: No   Drug use: No   Sexual activity: Not on file  Other Topics Concern   Not on file  Social History Narrative   Not on file   Social Drivers of Health   Financial Resource Strain: Low Risk  (07/01/2023)   Overall Financial Resource Strain (CARDIA)    Difficulty of Paying Living Expenses: Not hard at all  Food Insecurity: No Food Insecurity (07/15/2023)   Hunger Vital Sign    Worried About Running Out of Food in the Last Year: Never true    Ran Out of Food in the Last Year: Never true  Transportation Needs: No Transportation Needs (07/15/2023)   PRAPARE - Therapist, art (Medical): No    Lack of Transportation (Non-Medical): No  Recent Concern: Transportation Needs - Unmet Transportation Needs (07/01/2023)  PRAPARE - Administrator, Civil Service (Medical): Yes    Lack of Transportation (Non-Medical): Yes  Physical Activity: Unknown (07/01/2023)   Exercise Vital Sign    Days of Exercise per Week: 0 days    Minutes of Exercise per Session: Not on file  Stress: No Stress Concern Present (07/01/2023)   [employer] of Occupational Health - Occupational Stress Questionnaire    Feeling of Stress : Only a little  Social Connections: Socially Isolated (07/01/2023)   Social Connection and Isolation Panel [NHANES]    Frequency of Communication with Friends and Family: More than three times a week    Frequency of Social Gatherings with Friends and Family: More than three times a week    Attends Religious Services: Never    Database administrator or Organizations: No    Attends Banker Meetings: Not on file    Marital Status: Widowed  Intimate Partner Violence: Not At Risk (07/15/2023)   Humiliation, Afraid, Rape, and Kick questionnaire    Fear of Current or Ex-Partner: No    Emotionally Abused: No    Physically Abused: No    Sexually Abused: No    Review of Systems: Pertinent positive and negative review of systems were noted in the above HPI section.  All other review of systems was otherwise negative.   Physical Exam: Vital signs in last 24 hours: Temp:  [97.8 F (36.6 C)-98.3 F (36.8 C)] 97.9 F (36.6 C) (12/17 0800) Pulse Rate:  [78-148] 83 (12/17 0600) Resp:  [14-23] 19 (12/17 0800) BP: (110-149)/(33-69) 128/60 (12/17 0800) SpO2:  [93 %-100 %] 100 % (12/17 0600) Weight:  [65.6 kg-68 kg] 65.6 kg (12/16 2000) Last BM Date : 07/15/23 General:   Alert,  Well-developed, well-nourished, pleasant elderly white female  cooperative in NAD.  Daughter at bedside, patient is essentially blind  presently Head:  Normocephalic and atraumatic. Eyes:  Sclera clear, no icterus.  Olding left eye closed Ears:  Normal auditory acuity. Nose:  No deformity, discharge,  or lesions. Mouth:  No deformity or lesions.   Neck:  Supple; no masses or thyromegaly. Lungs:  Clear throughout to auscultation.   No wheezes, crackles, or rhonchi.  Heart:  Regular rate and rhythm; no murmurs, clicks, rubs,  or gallops. Abdomen:  Soft,nontender, BS active,nonpalp mass or hsm.   Rectal: Not done, documented heme positive Msk:  Symmetrical without gross deformities. . Pulses:  Normal pulses noted. Extremities:  Without clubbing or edema. Neurologic:  Alert and  oriented x4;  grossly normal neurologically. Skin:  Intact without significant lesions or rashes.. Psych:  Alert and cooperative. Normal mood and affect.  Intake/Output from previous day: 12/16 0701 - 12/17 0700 In: 795 [P.O.:480; Blood:315] Out: 550 [Urine:550] Intake/Output this shift: No intake/output data recorded.  Lab Results: Recent Labs    07/15/23 1446 07/15/23 1752 07/16/23 0315  WBC 13.4*  --  15.1*  HGB 8.5* 7.9* 9.3*  HCT 26.0* 23.5* 29.0*  PLT 283  --  255   BMET Recent Labs    07/15/23 1446 07/16/23 0315  NA 138 141  K 3.7 3.8  CL 104 108  CO2 23 25  GLUCOSE 151* 123*  BUN 58* 52*  CREATININE 1.07* 0.96  CALCIUM 8.7* 8.7*   LFT Recent Labs    07/16/23 0315  PROT 6.0*  ALBUMIN 3.2*  AST 13*  ALT 12  ALKPHOS 40  BILITOT 0.8   PT/INR No results for input(s): "LABPROT", "INR" in the  last 72 hours. Hepatitis Panel No results for input(s): "HEPBSAG", "HCVAB", "HEPAIGM", "HEPBIGM" in the last 72 hours.    IMPRESSION:  #64 89 year old white female admitted yesterday after onset of grossly melenic stools at home, associated with nausea and dizziness. Hemoglobin 8.5 on admission down from her baseline of 12 several months ago. This is in the setting of chronic Motrin use 1-2 daily with recent  increase. She has also had intermittent primarily solid food dysphagia with a few episodes of what sounds like transient food impaction over the past couple of years, increase in milder symptoms over the past several months with frequent coughing and sometimes regurgitation after eating Bleeding is consistent with an upper GI source, rule out NSAID/aspirin induced peptic ulcer disease, rule out ulcerative esophagitis, rule out neoplasm, Rule out distal esophageal stricture versus underlying motility issue  #2 anemia normocytic secondary to acute blood loss #3 near blindness-patient is blind in the right eye and has been having pain and other issues with her left eye, had a mesh placed this past weekend #4 chronic kidney disease #5.  Coronary artery disease #6.  History of lung cancer status post prior lobectomy #7  Diabetes mellitus #8  Anxiety/depression  Plan; keep n.p.o. this morning Continue IV Protonix twice daily Serial hemoglobins every 8 hours and transfuse for hemoglobin 7 or less Patient will be scheduled for EGD with Dr. Leone Payor this afternoon.  Procedure was discussed in detail with the patient and her daughter including indications risks and benefits and they are agreeable to proceed. Further recommendations pending findings of EGD Will need to stop NSAIDs.     Amy Esterwood PA-C 07/16/2023, 8:52 AM     [facility] Attending   I have taken an interval history, reviewed the chart and examined the patient. I agree with the Advanced Practitioner's note, impression and recommendations and the majority of the medical decisoon making was performed by me.  Iva Boop, MD, [facility] See Loretha Stapler on call - gastroenterology for best contact person 07/16/2023 1:35 PM

## 2023-07-16 NOTE — Op Note (Addendum)
[facility] Patient Name: Danielle Newman Procedure Date: 07/16/2023 MRN: [id number] Attending MD: Iva Boop , MD, [id number] Date of Birth: 11-27-1931 CSN: [id number] Age: 89 Admit Type: Inpatient Procedure:                Upper GI endoscopy Indications:              Dysphagia, Melena - recent ibuprofen use Providers:                Iva Boop, MD, Rogue Jury, RN, Norman Clay,                            RN, Kandice Robinsons, Technician Referring MD:              Medicines:                Monitored Anesthesia Care Complications:            No immediate complications. Estimated Blood Loss:     Estimated blood loss was minimal. Procedure:                Pre-Anesthesia Assessment:                           - Prior to the procedure, a History and Physical                            was performed, and patient medications and                            allergies were reviewed. The patient's tolerance of                            previous anesthesia was also reviewed. The risks                            and benefits of the procedure and the sedation                            options and risks were discussed with the patient.                            All questions were answered, and informed consent                            was obtained. Prior Anticoagulants: The patient has                            taken no anticoagulant or antiplatelet agents. ASA                            Grade Assessment: III - A patient with severe                            systemic disease. After reviewing the risks and  benefits, the patient was deemed in satisfactory                            condition to undergo the procedure.                           After obtaining informed consent, the endoscope was                            passed under direct vision. Throughout the                            procedure, the patient's blood pressure, pulse, and                             oxygen saturations were monitored continuously. The                            [id number]-[id number] ([id number]) Olympus endoscope was introduced                            through the mouth, and advanced to the second part                            of duodenum. The upper GI endoscopy was somewhat                            difficult due to unusual anatomy. The patient                            tolerated the procedure well. Scope In: Scope Out: Findings:      Three cratered duodenal ulcers; one with a visible vessel were found in       the duodenal bulb and in the second portion of the duodenum (Junction).       The largest lesion was 10 mm in largest dimension and bleeding. Not       amenable to clipping due to edema and stenosis (attempted but could not       place the clip on bleeding site) Area was successfully injected with 3.5       1:10K EPI for hemostasis. Estimated blood loss: none. Coagulation for       hemostasis using bipolar probe was successful. Estimated blood loss:       none. Purastat application      One benign-appearing, intrinsic moderate (circumferential scarring or       stenosis; an endoscope may pass) stenosis was found at the       gastroesophageal junction. This stenosis measured 1.2 cm (inner       diameter) x less than one cm (in length). The stenosis was traversed. A       TTS dilator was passed through the scope. Dilation with a 12-13.5-15 mm       balloon dilator was performed to 15 mm. The dilation site was examined       and showed moderate mucosal disruption. Estimated blood loss was minimal.      A deformity was found in the  entire examined stomach. Extreme J-shape      Biopsies were taken with a cold forceps in the gastric body and in the       gastric antrum for histology.      The exam was otherwise without abnormality.      The cardia and gastric fundus were normal on retroflexion. Impression:               - Oozing duodenal ulcer with a  visible vessel.                            Injected w/ 3.5 cc EPI 1:10K. Treated with bipolar                            cautery x.3 and then Purastat Two other smaller                            clean-based ulcers There is associated edema and                            stenosis - ulcers collected at D1/D2 junction and                            were acute (soft).                           - Benign-appearing esophageal stenosis. Dilated to                            15 nm - there is associated Grade C esophagitis                            thought from reflux                           - Deformity in the entire stomach. Extreme J-shape                           - The examination was otherwise normal.                           - Biopsies were taken with a cold forceps for                            histology in the gastric body and in the gastric                            antrum to assess for H pylori Moderate Sedation:      Not Applicable - Patient had care per Anesthesia. Recommendation:           - Await pathology results.                           - Clear liquids  Change to pantoprazole infusion to reduce future                            bleeding risk - using 40 mg q 6 instead                           F/U bxs and treat H pylori if +                           Serial Hgb - could need additional transfusion                           Stay off NSAID's                           Chronic PPI given findings today (GERD)                           Daughter Hansel Starling updated by phone Procedure Code(s):        --- Professional ---                           [phone], 59, Esophagogastroduodenoscopy, flexible,                            transoral; with control of bleeding, any method                           43249, Esophagogastroduodenoscopy, flexible,                            transoral; with transendoscopic balloon dilation of                            esophagus (less  than 30 mm diameter)                           43239, 59, Esophagogastroduodenoscopy, flexible,                            transoral; with biopsy, single or multiple Diagnosis Code(s):        --- Professional ---                           K26.4, Chronic or unspecified duodenal ulcer with                            hemorrhage                           K22.2, Esophageal obstruction                           K31.89, Other diseases of stomach and duodenum                           R13.10, Dysphagia,  unspecified                           K92.1, Melena (includes Hematochezia) CPT copyright 2022 American Medical Association. All rights reserved. The codes documented in this report are preliminary and upon coder review may  be revised to meet current compliance requirements. Iva Boop, MD 07/16/2023 2:35:03 PM This report has been signed electronically. Number of Addenda: 0

## 2023-07-16 NOTE — Progress Notes (Signed)
Stood at bedside and assisted to bedside commode. Cardiac monitor shows Sinus Tachycardia increasing to 146. Patient anxious. Unsteady on feet. Contact guard assist required. HR returned to baseline rate ~ 90 when returned to bed.

## 2023-07-16 NOTE — Transfer of Care (Signed)
Immediate Anesthesia Transfer of Care Note  Patient: [address]  Procedure(s) Performed: ESOPHAGOGASTRODUODENOSCOPY (EGD) WITH PROPOFOL BALLOON DILATION BIOPSY SUBMUCOSAL INJECTION HEMOSTASIS CONTROL HOT HEMOSTASIS (ARGON PLASMA COAGULATION/BICAP)  Patient Location: Endoscopy Unit  Anesthesia Type:MAC  Level of Consciousness: drowsy  Airway & Oxygen Therapy: Patient Spontanous Breathing and Patient connected to face mask  Post-op Assessment: Report given to RN and Post -op Vital signs reviewed and stable  Post vital signs: Reviewed and stable  Last Vitals:  Vitals Value Taken Time  BP    Temp    Pulse 84 07/16/23 1412  Resp    SpO2 100 % 07/16/23 1412  Vitals shown include unfiled device data.  Last Pain:  Vitals:   07/16/23 1247  TempSrc: Tympanic  PainSc: 0-No pain      Patients Stated Pain Goal: 0 (07/15/23 2237)  Complications: No notable events documented.

## 2023-07-16 NOTE — Anesthesia Procedure Notes (Signed)
Procedure Name: MAC Date/Time: 07/16/2023 1:40 PM  Performed by: Vanessa Palisade, CRNAPre-anesthesia Checklist: Patient identified, Emergency Drugs available, Suction available and Patient being monitored Patient Re-evaluated:Patient Re-evaluated prior to induction Oxygen Delivery Method: Simple face mask

## 2023-07-16 NOTE — Consult Note (Signed)
Chief Complaint  Patient presents with   Melena    Nausea    Nausea   Emesis  :       Ophthalmology HPI: This is a 89 y.o.  female with a past ocular history listed below that presents with left eye pain and discomfort for the past week. Pain was present last week. It has since resolved. 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 Pain has since improved. Vision is unchanged today.  She has history of blind painless eye OD.  She has endstage AMD OS.       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; since 2008   CORONARY ARTERY DISEASE 02/11/2008   DEPRESSION 02/11/2008   DM (diabetes mellitus) (HCC) 11/21/2011   DYSPNEA 09/11/2010   GERD 02/11/2008   HYPERLIPIDEMIA 02/11/2008   HYPOTHYROIDISM 02/11/2008   Impaired glucose tolerance 11/20/2011   LUNG CANCER, HX OF 02/11/2008   MACULAR DEGENERATION 03/29/2009   OSTEOPENIA 02/11/2008   Sarcoidosis 02/11/2008   Sight impaired 04/05/2011     Past Surgical History:  Procedure Laterality Date   s/p arm surgury  05/29/1994   s/p left upper lobectomy       Social History   Socioeconomic History   Marital status: Widowed    Spouse name: Not on file   Number of children: Not on file   Years of education: Not on file   Highest education level: Associate degree: occupational, Scientist, product/process development, or vocational program  Occupational History   Occupation: retired Producer, television/film/video   Tobacco Use   Smoking status: Former    Current packs/day: 0.00    Types: Cigarettes    Quit date: 05/29/1994    Years since quitting: 29.1   Smokeless tobacco: Never  Substance and Sexual Activity   Alcohol use: No    Drug use: No   Sexual activity: Not on file  Other Topics Concern   Not on file  Social History Narrative   Not on file   Social Drivers of Health   Financial Resource Strain: Low Risk  (07/01/2023)   Overall Financial Resource Strain (CARDIA)    Difficulty of Paying Living Expenses: Not hard at all  Food Insecurity: No Food Insecurity (07/15/2023)   Hunger Vital Sign    Worried About Running Out of Food in the Last Year: Never true    Ran Out of Food in the Last Year: Never true  Transportation Needs: No Transportation Needs (07/15/2023)   PRAPARE - Administrator, Civil Service (Medical): No    Lack of Transportation (Non-Medical): No  Recent Concern: Transportation Needs - Unmet Transportation Needs (07/01/2023)   PRAPARE - Transportation    Lack of Transportation (Medical): Yes    Lack of Transportation (Non-Medical): Yes  Physical Activity: Unknown (07/01/2023)   Exercise Vital Sign    Days of Exercise per Week: 0 days    Minutes of Exercise per Session: Not on file  Stress: No Stress Concern Present (07/01/2023)   [employer] of Occupational Health - Occupational Stress Questionnaire    Feeling of Stress : Only a little  Social  Connections: Socially Isolated (07/01/2023)   Social Connection and Isolation Panel [NHANES]    Frequency of Communication with Friends and Family: More than three times a week    Frequency of Social Gatherings with Friends and Family: More than three times a week    Attends Religious Services: Never    Database administrator or Organizations: No    Attends Banker Meetings: Not on file    Marital Status: Widowed  Intimate Partner Violence: Not At Risk (07/15/2023)   Humiliation, Afraid, Rape, and Kick questionnaire    Fear of Current or Ex-Partner: No    Emotionally Abused: No    Physically Abused: No    Sexually Abused: No     Allergies  Allergen Reactions   Fioricet [Butalbital-Apap-Caffeine] Other (See  Comments)    Confusion    Meperidine Hcl Other (See Comments)    Reaction not noted     No current facility-administered medications on file prior to encounter.   Current Outpatient Medications on File Prior to Encounter  Medication Sig Dispense Refill   Acidophilus Lactobacillus CAPS Take 1 capsule by mouth daily.     albuterol (VENTOLIN HFA) 108 (90 Base) MCG/ACT inhaler Inhale 2 puffs into the lungs every 6 (six) hours as needed for wheezing or shortness of breath. 8 g 0   aspirin EC 81 MG tablet Take 81 mg by mouth daily. Swallow whole.     Cholecalciferol (VITAMIN D3) 50 MCG (2000 UT) TABS Take 2,000 Units by mouth daily.     citalopram (CELEXA) 10 MG tablet Take 1 tablet (10 mg total) by mouth daily. 90 tablet 3   cyclobenzaprine (FLEXERIL) 5 MG tablet Take 1 tablet (5 mg total) by mouth 3 (three) times daily as needed for muscle spasms. 40 tablet 2   levothyroxine (SYNTHROID) 100 MCG tablet TAKE 1 TABLET BY MOUTH DAILY (Patient taking differently: Take 100 mcg by mouth daily before breakfast.) 90 tablet 3   lovastatin (MEVACOR) 40 MG tablet TAKE 1 TABLET BY MOUTH DAILY 90 tablet 3   moxifloxacin (VIGAMOX) 0.5 % ophthalmic solution Place 1 drop into the left eye See admin instructions. Instill 1 drop into the left eye four times a day, alternating with generic Maxitrol- until the next office visit Administration times: 0800/1200/1600/2000     neomycin-polymyxin b-dexamethasone (MAXITROL) 3.5-10000-0.1 SUSP Place 1 drop into the left eye See admin instructions. Instill 1 drop into the left eye four times a day, alternating with generic Vigamox- until the next office visit Administration Times: 0600/1000/1400/1800     pantoprazole (PROTONIX) 40 MG tablet TAKE ONE TABLET BY MOUTH TWICE A DAY (Patient taking differently: Take 40 mg by mouth at bedtime.) 180 tablet 3   REFRESH TEARS PF 0.5-0.9 % SOLN Place 1 drop into both eyes See admin instructions. Instill 1 drop into affected eye(s) up  to six times a day as needed for matted appearance     timolol (TIMOPTIC) 0.5 % ophthalmic solution INSTILL ONE DROP INTO EACH EYE TWO TIMES A DAY (Patient taking differently: Place 1 drop into both eyes in the morning and at bedtime.) 15 mL 12   AMBULATORY NON FORMULARY MEDICATION Custom shoe lifts to treat acquired leg length discrepancy. [facility]   Fax # [phone] 1 each 0   ciclopirox (PENLAC) 8 % solution Apply topically at bedtime. Apply over nail and surrounding skin. Apply daily over previous coat. After seven (7) days, may remove with alcohol and continue cycle. (Patient not taking: Reported  on 07/15/2023) 6.6 mL 2   triamcinolone (NASACORT) 55 MCG/ACT AERO nasal inhaler Place 2 sprays into the nose daily. (Patient not taking: Reported on 07/15/2023) 1 each 12     ROS    Exam:  General: Awake, Alert, Oriented *2  Vision (near): without correction    OD: NLP  OS: HM  Responds to all four quadrants OS    External:   Normal Symmetry, V1-3 intact, infraorbital nerve appears intact.     Pupils  OD: 5mm to 4mm reactive APD OD  OS: 4mm to 3mm reactive without afferent pupillary defect (APD)   IOP(tonopen)  OD: 12  OS: 14  Slit Lamp Exam:  Lids/Lashes  OD: Normal Lids and lashes, No lesion or injury  OS: 2+ Bleph  Conjucntiva/Sclera  OD: White and quiet  OS: White and quiet, 2+ Mucus  Cornea  OD: Clear without abrasion or defect  OS: No abrasion, BCL in place, Amniotic membrane in place  Anterior Chamber  OD: Deep and quiet  OS: Deep and quiet  Iris  OD: Normal iris architecture  OS: Normal Iris Architecture   Lens  OD: Pseudophakia  OS: Pseudophakia     POSTERIOR POLE EXAM (Dialated with phenylephrine and tropicamide.Dilation may last up to 24 hours)  View:   OD: Limited peripheral view OD   OS: Limited peripheral view OD   Vitreous:   OD: Clear, no cell  OS: Clear, no cell  Disc:   OD: palor  OS: flat, sharp margin, with  appropriate color  C:D Ratio:   OD: 0.9  OS: 0.8  Macula  OD: Geographic atroph, fibrosis   OS: Subretinal fibrosis     Periphery  OD: appears flat. Limited view   OS: appears flat     Assessment and Plan:   This is 89 y.o.  female with recurrent erosion OS.  BCL and amniotic membrane in place.  - Continue gatifloxacin QID OS - Continue Maxitrol QID OS   Glaucoma - Continue TImolol BID OU   Follow up as outpatient with [facility] ophthalmology within one week of discharge.   Call for new or worsening vision.   Mack Hook, M.D.  [facility] [address] [place], [place] [address] [phone] ([phone]

## 2023-07-16 NOTE — Progress Notes (Signed)
[facility]  PROGRESS NOTE  Danielle Newman XBJ:[id number] DOB: Dec 12, 1931 DOA: 07/15/2023 PCP: Corwin Levins, MD   Brief HPI:   89 y.o. female with medical history significant of allergic rhinitis, anxiety, depression, right eye blindness, macular degeneration, CAD, type 2 diabetes, dyspnea, GERD, hyperlipidemia, hypothyroidism, prediabetes, history of lung cancer, sarcoidosis who presented to the emergency department with complaints of, nausea, an episode of non- coffee-ground emesis and melena for the last few days associated with progressively worse dyspnea and fatigue.  No chest pain, palpitations, or lower extremity edema.  She uses ibuprofen daily multiple times for joint and left thigh pain.  : CT abdomen/pelvis with contrast with no evidence of acute pulmonary embolism.  There is interstitial thickening and peribronchial nodularity of the upper lobes and right and lower lobe suggest chronic pulmonary inflammation.  Nodules measuring up to 12 mm in the right lower lobe    Assessment/Plan:   Upper GI bleed with melena -Patient presented with black-colored stool -Started on Protonix 40 mg IV every 12 hours -Gastroenterology consulted, plan for EGD today. -Hemodynamically she is stable  Acute blood loss anemia -Hemoglobin was found to be low at 7.9; last hemoglobin from May 2024 was 12.0 -Status post 1 unit PRBC, hemoglobin improved to 9.3 this morning -Continue to monitor H&H and transfuse for Hb less than 7   Choroiditis of both eyes   Blind right eye Continue alternating 4 times daily antibiotic eyedrops on left eye. Continue timolol drops in both eyes twice daily. -Ophthalmology consult Dr. Genia Del, as per family request.  Dr. Leretha Dykes from [facility] cannot come to the hospital as he does not have privileges at [facility].   Hypothyroidism -Continue Synthroid 100 mcg daily  Hyperlipidemia -Continue lovastatin 40 mg daily  Depression -Continue escitalopram 10 mg  daily  CAD -Hold aspirin, continue statin  CKD stage IIIa -Creatinine stable  Medications     sodium chloride   Intravenous Once   Chlorhexidine Gluconate Cloth  6 each Topical Daily   citalopram  10 mg Oral Daily   gatifloxacin  1 drop Left Eye QID   levothyroxine  100 mcg Oral QAC breakfast   neomycin-polymyxin b-dexamethasone   Left Eye QID   pantoprazole (PROTONIX) IV  40 mg Intravenous Q12H   pravastatin  40 mg Oral Daily   timolol  1 drop Left Eye BID     Data Reviewed:   CBG:  No results for input(s): "GLUCAP" in the last 168 hours.  SpO2: 100 %    Vitals:   07/16/23 0439 07/16/23 0445 07/16/23 0500 07/16/23 0600  BP:   (!) 147/52 (!) 141/54  Pulse: 90  88 83  Resp: 20 20 (!) 23 (!) 22  Temp:      TempSrc:      SpO2: 100% 100% 100% 100%  Weight:      Height:          Data Reviewed:  Basic Metabolic Panel: Recent Labs  Lab 07/15/23 1446 07/16/23 0315  NA 138 141  K 3.7 3.8  CL 104 108  CO2 23 25  GLUCOSE 151* 123*  BUN 58* 52*  CREATININE 1.07* 0.96  CALCIUM 8.7* 8.7*    CBC: Recent Labs  Lab 07/15/23 1446 07/15/23 1752 07/16/23 0315  WBC 13.4*  --  15.1*  NEUTROABS 9.9*  --   --   HGB 8.5* 7.9* 9.3*  HCT 26.0* 23.5* 29.0*  MCV 93.2  --  92.1  PLT 283  --  255    LFT Recent Labs  Lab 07/15/23 1446 07/16/23 0315  AST 19 13*  ALT 11 12  ALKPHOS 39 40  BILITOT 0.6 0.8  PROT 6.4* 6.0*  ALBUMIN 3.2* 3.2*     Antibiotics: Anti-infectives (From admission, onward)    None        DVT prophylaxis: SCDs  Code Status: Full code  Family Communication: Discussed with patient's daughter at bedside   CONSULTS    Subjective   Denies abdominal pain.   Objective    Physical Examination:   General-appears in no acute distress HEENT-blindness in right eye, Heart-S1-S2, regular, no murmur auscultated Lungs-clear to auscultation bilaterally, no wheezing or crackles auscultated Abdomen-soft, nontender, no  organomegaly Extremities-no edema in the lower extremities Neuro-alert, oriented x3, no focal deficit noted   Status is: Inpatient:             Meredeth Ide   [facility] If 7PM-7AM, please contact night-coverage at [email or web address], Office  [phone]   07/16/2023, 7:35 AM  LOS: 0 days

## 2023-07-16 NOTE — Anesthesia Postprocedure Evaluation (Signed)
Anesthesia Post Note  Patient: Danielle Newman  Procedure(s) Performed: ESOPHAGOGASTRODUODENOSCOPY (EGD) WITH PROPOFOL BALLOON DILATION BIOPSY SUBMUCOSAL INJECTION HEMOSTASIS CONTROL HOT HEMOSTASIS (ARGON PLASMA COAGULATION/BICAP)     Patient location during evaluation: PACU Anesthesia Type: MAC Level of consciousness: awake and alert Pain management: pain level controlled Vital Signs Assessment: post-procedure vital signs reviewed and stable Respiratory status: spontaneous breathing, nonlabored ventilation and respiratory function stable Cardiovascular status: stable and blood pressure returned to baseline Anesthetic complications: no   No notable events documented.  Last Vitals:  Vitals:   07/16/23 1435 07/16/23 1440  BP: (!) 149/54 (!) 161/65  Pulse: 98 82  Resp: (!) 25 (!) 24  Temp:    SpO2: 100% 100%    Last Pain:  Vitals:   07/16/23 1440  TempSrc:   PainSc: 0-No pain                 Beryle Lathe

## 2023-07-17 DIAGNOSIS — D62 Acute posthemorrhagic anemia: Secondary | ICD-10-CM | POA: Diagnosis not present

## 2023-07-17 DIAGNOSIS — K264 Chronic or unspecified duodenal ulcer with hemorrhage: Secondary | ICD-10-CM | POA: Diagnosis not present

## 2023-07-17 DIAGNOSIS — H3093 Unspecified chorioretinal inflammation, bilateral: Secondary | ICD-10-CM | POA: Diagnosis not present

## 2023-07-17 DIAGNOSIS — K921 Melena: Secondary | ICD-10-CM | POA: Diagnosis not present

## 2023-07-17 DIAGNOSIS — E039 Hypothyroidism, unspecified: Secondary | ICD-10-CM | POA: Diagnosis not present

## 2023-07-17 DIAGNOSIS — K269 Duodenal ulcer, unspecified as acute or chronic, without hemorrhage or perforation: Secondary | ICD-10-CM | POA: Diagnosis not present

## 2023-07-17 DIAGNOSIS — K209 Esophagitis, unspecified without bleeding: Secondary | ICD-10-CM | POA: Diagnosis not present

## 2023-07-17 LAB — HEMOGLOBIN AND HEMATOCRIT, BLOOD
HCT: 24.5 % — ABNORMAL LOW (ref 36.0–46.0)
HCT: 25.2 % — ABNORMAL LOW (ref 36.0–46.0)
HCT: 26 % — ABNORMAL LOW (ref 36.0–46.0)
Hemoglobin: 7.8 g/dL — ABNORMAL LOW (ref 12.0–15.0)
Hemoglobin: 8.2 g/dL — ABNORMAL LOW (ref 12.0–15.0)
Hemoglobin: 8.4 g/dL — ABNORMAL LOW (ref 12.0–15.0)

## 2023-07-17 LAB — SURGICAL PATHOLOGY

## 2023-07-17 LAB — PROTIME-INR
INR: 1.2 (ref 0.8–1.2)
Prothrombin Time: 15.3 s — ABNORMAL HIGH (ref 11.4–15.2)

## 2023-07-17 LAB — GLUCOSE, CAPILLARY
Glucose-Capillary: 97 mg/dL (ref 70–99)
Glucose-Capillary: 119 mg/dL — ABNORMAL HIGH (ref 70–99)
Glucose-Capillary: 174 mg/dL — ABNORMAL HIGH (ref 70–99)
Glucose-Capillary: 92 mg/dL (ref 70–99)

## 2023-07-17 NOTE — Plan of Care (Signed)
  Problem: Coping: Goal: Ability to adjust to condition or change in health will improve Outcome: Progressing   

## 2023-07-17 NOTE — Progress Notes (Addendum)
Patient ID: Danielle Newman, female   DOB: [birth year], 89 y.o.   MRN: [id number]    Progress Note   Subjective  Day # 2 CC; melena, anemia weakness/dizziness  Patient says she feels okay still a bit weak would like to try to get out of bed, hungry no complaints of abdominal pain or nausea.  She has not had any further bowel movements or melena today.  Daughter at bedside  EGD yesterday-3 cratered duodenal ulcers 1 with visible vessel in the duodenal bulb largest 10 mm not amenable to clipping due to edema and stenosis it was successfully injected with epi, and cauterized then.  Purastat gel Also noted to have a benign stenosis at the GE junction which was dilated from 12 to 15 mm balloon  Path pending Labs today-hemoglobin 8.2-post 1 unit packed RBCs on admission   Objective   Vital signs in last 24 hours: Temp:  [97.2 F (36.2 C)-98.5 F (36.9 C)] 97.9 F (36.6 C) (12/18 0800) Pulse Rate:  [64-103] 85 (12/18 0900) Resp:  [11-26] 16 (12/18 0900) BP: (107-167)/(37-76) 160/63 (12/18 0900) SpO2:  [97 %-100 %] 98 % (12/18 0900) Last BM Date : 07/15/23 General:    elderly white female in NAD Heart:  Regular rate and rhythm; no murmurs Lungs: Respirations even and unlabored, lungs CTA bilaterally Abdomen:  Soft, nontender and nondistended. Normal bowel sounds. Extremities:  Without edema. Neurologic:  Alert and oriented,  grossly normal neurologically. Psych:  Cooperative. Normal mood and affect.  Intake/Output from previous day: 12/17 0701 - 12/18 0700 In: 200 [I.V.:200] Out: 900 [Urine:900] Intake/Output this shift: No intake/output data recorded.  Lab Results: Recent Labs    07/15/23 1446 07/15/23 1752 07/16/23 0315 07/16/23 1037 07/16/23 1757 07/17/23 0252 07/17/23 0925  WBC 13.4*  --  15.1*  --   --   --   --   HGB 8.5*   < > 9.3*   < > 8.1* 7.8* 8.2*  HCT 26.0*   < > 29.0*   < > 25.6* 24.5* 25.2*  PLT 283  --  255  --   --   --   --    < > = values in this  interval not displayed.   BMET Recent Labs    07/15/23 1446 07/16/23 0315  NA 138 141  K 3.7 3.8  CL 104 108  CO2 23 25  GLUCOSE 151* 123*  BUN 58* 52*  CREATININE 1.07* 0.96  CALCIUM 8.7* 8.7*   LFT Recent Labs    07/16/23 0315  PROT 6.0*  ALBUMIN 3.2*  AST 13*  ALT 12  ALKPHOS 40  BILITOT 0.8   PT/INR Recent Labs    07/17/23 0252  LABPROT 15.3*  INR 1.2    Studies/Results: CT ABDOMEN PELVIS W CONTRAST Result Date: 07/15/2023 CLINICAL DATA:  High probability pulmonary embolism. EXAM: CT ANGIOGRAPHY CHEST CT ABDOMEN AND PELVIS WITH CONTRAST TECHNIQUE: Multidetector CT imaging of the chest was performed using the standard protocol during bolus administration of intravenous contrast. Multiplanar CT image reconstructions and MIPs were obtained to evaluate the vascular anatomy. Multidetector CT imaging of the abdomen and pelvis was performed using the standard protocol during bolus administration of intravenous contrast. RADIATION DOSE REDUCTION: This exam was performed according to the departmental dose-optimization program which includes automated exposure control, adjustment of the mA and/or kV according to patient size and/or use of iterative reconstruction technique. CONTRAST:  OMNIPAQUE IOHEXOL 350 MG/ML SOLN COMPARISON:  None Available. FINDINGS: CTA CHEST  FINDINGS Cardiovascular: No filling defects within the pulmonary arteries to suggest acute pulmonary embolism. Mediastinum/Nodes: No axillary or supraclavicular adenopathy. No mediastinal or hilar adenopathy. No pericardial fluid. Esophagus normal. Lungs/Pleura: Interstitial thickening peribronchial nodularity in the upper lobes suggest chronic pulmonary infection. Similar findings in the RIGHT lower lobe. Nodules measure up to 12 mm in RIGHT lower lobe (image 87/series 12) these findings were subtly present on comparison CT from 2008. Significant progression in the interval. Musculoskeletal: Multiple healed LEFT rib  fractures. Acute findings. CT ABDOMEN AND PELVIS FINDINGS Hepatobiliary: No focal hepatic lesion. No biliary ductal dilatation. Gallbladder is normal. Common bile duct is normal. Pancreas: Pancreas is normal. No ductal dilatation. No pancreatic inflammation. Spleen: Low-density 2.5 cm lesion in the spleen is favored benign cysts or hemangioma. Adrenals/urinary tract: enlargement of the LEFT adrenal gland to 16 mm. These enlarged gland has low density on the CT of the chest consistent benign adenoma. RIGHT adrenal gland normal. Several benign low-density cyst of the LEFT or RIGHT kidney. No follow-up recommended for benign renal lesion. No obstructive uropathy. Ureters and bladder normal. Stomach/Bowel: Small hiatal hernia. Stomach, small bowel, appendix, and cecum are normal. Multiple diverticula of the descending colon and sigmoid colon without acute inflammation. Vascular/Lymphatic: Abdominal aorta is normal caliber with atherosclerotic calcification. There is no retroperitoneal or periportal lymphadenopathy. No pelvic lymphadenopathy. Reproductive: Uterus and adnexa unremarkable. Other: No free fluid. Musculoskeletal: No aggressive osseous lesion. Review of the MIP images confirms the above findings. IMPRESSION: CHEST: 1. No evidence acute pulmonary embolism. 2. Interstitial thickening and peribronchial nodularity in the upper lobes and RIGHT and lower lobe suggest chronic pulmonary infection (e.g. MIA). Nodules measure up to 12 mm in the RIGHT lower lobe. Subtle findings in the RIGHT lung on CT from 2008 with interval progression PELVIS: 1. No acute findings in the abdomen pelvis. 2. Diverticulosis without diverticulitis. 3. Benign LEFT adrenal adenoma. 4.  Aortic Atherosclerosis (ICD10-I70.0). Electronically Signed   By: Genevive Bi M.D.   On: 07/15/2023 16:58   CT Angio Chest PE W and/or Wo Contrast Result Date: 07/15/2023 CLINICAL DATA:  High probability pulmonary embolism. EXAM: CT ANGIOGRAPHY CHEST  CT ABDOMEN AND PELVIS WITH CONTRAST TECHNIQUE: Multidetector CT imaging of the chest was performed using the standard protocol during bolus administration of intravenous contrast. Multiplanar CT image reconstructions and MIPs were obtained to evaluate the vascular anatomy. Multidetector CT imaging of the abdomen and pelvis was performed using the standard protocol during bolus administration of intravenous contrast. RADIATION DOSE REDUCTION: This exam was performed according to the departmental dose-optimization program which includes automated exposure control, adjustment of the mA and/or kV according to patient size and/or use of iterative reconstruction technique. CONTRAST:  OMNIPAQUE IOHEXOL 350 MG/ML SOLN COMPARISON:  None Available. FINDINGS: CTA CHEST FINDINGS Cardiovascular: No filling defects within the pulmonary arteries to suggest acute pulmonary embolism. Mediastinum/Nodes: No axillary or supraclavicular adenopathy. No mediastinal or hilar adenopathy. No pericardial fluid. Esophagus normal. Lungs/Pleura: Interstitial thickening peribronchial nodularity in the upper lobes suggest chronic pulmonary infection. Similar findings in the RIGHT lower lobe. Nodules measure up to 12 mm in RIGHT lower lobe (image 87/series 12) these findings were subtly present on comparison CT from 2008. Significant progression in the interval. Musculoskeletal: Multiple healed LEFT rib fractures. Acute findings. CT ABDOMEN AND PELVIS FINDINGS Hepatobiliary: No focal hepatic lesion. No biliary ductal dilatation. Gallbladder is normal. Common bile duct is normal. Pancreas: Pancreas is normal. No ductal dilatation. No pancreatic inflammation. Spleen: Low-density 2.5 cm lesion in the  spleen is favored benign cysts or hemangioma. Adrenals/urinary tract: enlargement of the LEFT adrenal gland to 16 mm. These enlarged gland has low density on the CT of the chest consistent benign adenoma. RIGHT adrenal gland normal. Several benign  low-density cyst of the LEFT or RIGHT kidney. No follow-up recommended for benign renal lesion. No obstructive uropathy. Ureters and bladder normal. Stomach/Bowel: Small hiatal hernia. Stomach, small bowel, appendix, and cecum are normal. Multiple diverticula of the descending colon and sigmoid colon without acute inflammation. Vascular/Lymphatic: Abdominal aorta is normal caliber with atherosclerotic calcification. There is no retroperitoneal or periportal lymphadenopathy. No pelvic lymphadenopathy. Reproductive: Uterus and adnexa unremarkable. Other: No free fluid. Musculoskeletal: No aggressive osseous lesion. Review of the MIP images confirms the above findings. IMPRESSION: CHEST: 1. No evidence acute pulmonary embolism. 2. Interstitial thickening and peribronchial nodularity in the upper lobes and RIGHT and lower lobe suggest chronic pulmonary infection (e.g. MIA). Nodules measure up to 12 mm in the RIGHT lower lobe. Subtle findings in the RIGHT lung on CT from 2008 with interval progression PELVIS: 1. No acute findings in the abdomen pelvis. 2. Diverticulosis without diverticulitis. 3. Benign LEFT adrenal adenoma. 4.  Aortic Atherosclerosis (ICD10-I70.0). Electronically Signed   By: Genevive Bi M.D.   On: 07/15/2023 16:58       Assessment / Plan:    #3 89 year old white female presenting with acute upper GI bleed in setting of chronic Motrin use and recent increase in dosage secondary to eye pain  Patient found to have 3 cratered duodenal ulcers yesterday 1 with a visible vessel which was treated with epi/cautery/gel for hemostasis   very likely NSAID induced, gastric biopsies to rule out H. pylori are pending  #2 anemia acute secondary to acute GI blood loss-stable post 1 unit of packed RBCs  #3 legal blindness, patient is having current issues with her left eye and had a mesh placed by her ophthalmologist last week, no complaints of eye pain today #4 coronary artery disease #5.  Chronic  kidney disease #6.  History of lung cancer status post prior lobectomy #7.  Diabetes mellitus  Plan; full liquid lunch then soft diet for dinner Continue IV Protonix twice daily through till tomorrow morning, when she does go home will need twice daily PPI for at least 8 weeks and then would keep her on daily PPI long-term Protonix 40 mg I had a discussion with the patient and daughter regarding discontinuing all NSAID use moving forward, she can use regular strength Tylenol  Possible discharge tomorrow if stable and able to ambulate etc.   Principal Problem:   Gastrointestinal hemorrhage with melena Active Problems:   Hypothyroidism   Hyperlipidemia   Depression   Coronary atherosclerosis   GERD   Blind right eye   DM (diabetes mellitus) (HCC)   CKD (chronic kidney disease) stage 3, GFR 30-59 ml/min   Choroiditis of both eyes   ABLA (acute blood loss anemia)   Acute duodenal ulcer with hemorrhage   Esophageal stricture   Upper GI bleed     LOS: 1 day   Amy EsterwoodPA-C  07/17/2023, 10:05 AM      [facility] Attending   I have taken an interval history, reviewed the chart and examined the patient. I agree with the Advanced Practitioner's note, impression and recommendations with the following additions:  Biopsies were negative for H pylori.Minimal chronic gastritis  Iva Boop, MD, [facility] See Loretha Stapler on call - gastroenterology for best contact person 07/17/2023 2:30  PM

## 2023-07-17 NOTE — Progress Notes (Signed)
[facility]  PROGRESS NOTE  Danielle LITZINGER Newman:[id number] DOB: April 28, 1932 DOA: 07/15/2023 PCP: Corwin Levins, MD   Brief HPI:   89 y.o. female with medical history significant of allergic rhinitis, anxiety, depression, right eye blindness, macular degeneration, CAD, type 2 diabetes, dyspnea, GERD, hyperlipidemia, hypothyroidism, prediabetes, history of lung cancer, sarcoidosis who presented to the emergency department with complaints of, nausea, an episode of non- coffee-ground emesis and melena for the last few days associated with progressively worse dyspnea and fatigue.  No chest pain, palpitations, or lower extremity edema.  She uses ibuprofen daily multiple times for joint and left thigh pain.  : CT abdomen/pelvis with contrast with no evidence of acute pulmonary embolism.  There is interstitial thickening and peribronchial nodularity of the upper lobes and right and lower lobe suggest chronic pulmonary inflammation.  Nodules measuring up to 12 mm in the right lower lobe    Assessment/Plan:   Upper GI bleed with melena -Patient presented with black-colored stool Underwent EGD yesterday showed 3 cratered duodenal ulcer, 1 with visible vessel in the duodenal bulb, largest 10 mm not unable to clipping due to edema and stenosis.  It was successfully injected with epinephrine and cauterized.  Also noted to have benign stenosis at GE junction which was dilated from 12 to 15 mm balloon. -Started on Protonix 40 mg IV every 12 hours -Gastroenterology consulted, plan for EGD today.  Acute blood loss anemia -Hemoglobin was found to be low at 7.9; last hemoglobin from May 2024 was 12.0 -Status post 1 unit PRBC, hemoglobin improved to 8.2 this morning -Continue to monitor H&H and transfuse for Hb less than 7   Choroiditis of both eyes   Blind right eye Continue alternating 4 times daily antibiotic eyedrops on left eye. Continue timolol drops in both eyes twice daily. -Ophthalmology  consult Dr. Genia Del, as per family request.  Dr. Leretha Dykes from [facility] cannot come to the hospital as he does not have privileges at [facility].   Hypothyroidism -Continue Synthroid 100 mcg daily  Hyperlipidemia -Continue lovastatin 40 mg daily  Depression -Continue escitalopram 10 mg daily  CAD -Hold aspirin, continue statin  CKD stage IIIa -Creatinine stable  Medications     sodium chloride   Intravenous Once   Chlorhexidine Gluconate Cloth  6 each Topical Daily   citalopram  10 mg Oral Daily   gatifloxacin  1 drop Left Eye QID   levothyroxine  100 mcg Oral QAC breakfast   neomycin-polymyxin b-dexamethasone  1 drop Left Eye QID   pantoprazole (PROTONIX) IV  40 mg Intravenous Q6H   Followed by   Melene Muller ON 07/19/2023] pantoprazole (PROTONIX) IV  40 mg Intravenous Q12H   pravastatin  40 mg Oral Daily   timolol  1 drop Both Eyes BID     Data Reviewed:   CBG:  Recent Labs  Lab 07/16/23 0742 07/16/23 1141 07/16/23 1613 07/16/23 2130  GLUCAP 125* 125* 127* 97    SpO2: 97 %    Vitals:   07/17/23 0200 07/17/23 0300 07/17/23 0400 07/17/23 0500  BP: (!) 126/38 (!) 142/63 (!) 167/76 (!) 124/43  Pulse: 79 65 (!) 103 69  Resp: (!) 25 20 (!) 21 17  Temp:   97.7 F (36.5 C)   TempSrc:   Oral   SpO2: 100% 99% 97% 97%  Weight:      Height:          Data Reviewed:  Basic Metabolic Panel: Recent Labs  Lab 07/15/23 1446 07/16/23  0315  NA 138 141  K 3.7 3.8  CL 104 108  CO2 23 25  GLUCOSE 151* 123*  BUN 58* 52*  CREATININE 1.07* 0.96  CALCIUM 8.7* 8.7*    CBC: Recent Labs  Lab 07/15/23 1446 07/15/23 1752 07/16/23 0315 07/16/23 1037 07/16/23 1757 07/17/23 0252  WBC 13.4*  --  15.1*  --   --   --   NEUTROABS 9.9*  --   --   --   --   --   HGB 8.5* 7.9* 9.3* 8.9* 8.1* 7.8*  HCT 26.0* 23.5* 29.0* 26.9* 25.6* 24.5*  MCV 93.2  --  92.1  --   --   --   PLT 283  --  255  --   --   --     LFT Recent Labs  Lab 07/15/23 1446 07/16/23 0315  AST 19  13*  ALT 11 12  ALKPHOS 39 40  BILITOT 0.6 0.8  PROT 6.4* 6.0*  ALBUMIN 3.2* 3.2*     Antibiotics: Anti-infectives (From admission, onward)    None        DVT prophylaxis: SCDs  Code Status: Full code  Family Communication: Discussed with patient's daughter at bedside   CONSULTS    Subjective   Denies pain.  No nausea or vomiting.   Objective    Physical Examination:    General-appears in no acute distress Heart-S1-S2, regular, no murmur auscultated Lungs-clear to auscultation bilaterally, no wheezing or crackles auscultated Abdomen-soft, nontender, no organomegaly Extremities-no edema in the lower extremities Neuro-alert, oriented x3, no focal deficit noted  Status is: Inpatient:             Meredeth Ide   [facility] If 7PM-7AM, please contact night-coverage at [email or web address], Office  [phone]   07/17/2023, 7:37 AM  LOS: 1 day

## 2023-07-17 NOTE — Progress Notes (Signed)
Bed assignment received for 3W, room 1339.  Pt and daughter, Danielle Newman, notified of upcomming transfer.  Pt report called to Kathaleen Bury RN.  Pt transferred via w/c, with all belongings, including eyedrops x4 and denture cup with dentures.

## 2023-07-17 NOTE — Evaluation (Signed)
Physical Therapy Evaluation Patient Details Name: Danielle Newman MRN: [id number] DOB: March 05, 1932 Today's Date: 07/17/2023  History of Present Illness  89 yo female admitted with GI hemorrhage with melena, anemia, weakness. Hx of CAD, CKD, near blindness, DM, sarcoidosis, lung ca s/p lobectomy  Clinical Impression  On eval, pt required Min A for mobility. She walked ~10 feet with a RW. HR 107 bpm at rest, 145 bpm with minimal activity/ambulation. Daughter present during session-pt needs to be able to ambulate household distance. Will plan to follow and progress activity as tolerated. Will recommend HHPT, HHOT. OT eval order placed as well.         If plan is discharge home, recommend the following: A little help with walking and/or transfers;A little help with bathing/dressing/bathroom;Assistance with cooking/housework;Assist for transportation;Help with stairs or ramp for entrance   Can travel by private vehicle        Equipment Recommendations None recommended by PT  Recommendations for Other Services       Functional Status Assessment Patient has had a recent decline in their functional status and demonstrates the ability to make significant improvements in function in a reasonable and predictable amount of time.     Precautions / Restrictions Precautions Precautions: Fall Precaution Comments: monitor HR Restrictions Weight Bearing Restrictions Per Provider Order: No      Mobility  Bed Mobility Overal bed mobility: Needs Assistance Bed Mobility: Supine to Sit     Supine to sit: Contact guard, HOB elevated, Used rails     General bed mobility comments: Close guarding and cues for safety. Increased time.    Transfers Overall transfer level: Needs assistance Equipment used: Rolling walker (2 wheels) Transfers: Sit to/from Stand Sit to Stand: Min assist           General transfer comment: Assist to rise, steady, control descent. Cues for safety, hand placement.  Increased time.    Ambulation/Gait Ambulation/Gait assistance: Min assist Gait Distance (Feet): 10 Feet Assistive device: Rolling walker (2 wheels) Gait Pattern/deviations: Step-through pattern, Decreased stride length, Decreased step length - left, Decreased step length - right       General Gait Details: Assist to stabilize pt throughout distance and to manage RW. LOB x 1 posteriorly requiring assist from therapist to prevent fall. Directional and safey cues.  Stairs            Wheelchair Mobility     Tilt Bed    Modified Rankin (Stroke Patients Only)       Balance Overall balance assessment: Needs assistance         Standing balance support: Bilateral upper extremity supported, Reliant on assistive device for balance, During functional activity Standing balance-Leahy Scale: Poor                               Pertinent Vitals/Pain Pain Assessment Pain Assessment: Faces Faces Pain Scale: Hurts a little bit Pain Location: bil thighs Pain Descriptors / Indicators: Discomfort, Aching Pain Intervention(s): Limited activity within patient's tolerance, Monitored during session, Repositioned    Home Living Family/patient expects to be discharged to:: Private residence Living Arrangements: Children Available Help at Discharge: Family Type of Home: House Home Access: Stairs to enter   Secretary/administrator of Steps: 2   Home Layout: Able to live on main level with bedroom/bathroom Home Equipment: Agricultural consultant (2 wheels)      Prior Function Prior Level of Function : Independent/Modified Independent  Mobility Comments: using RW ADLs Comments: mod ind with bathing, dressing. makes some meals-no oven.     Extremity/Trunk Assessment   Upper Extremity Assessment Upper Extremity Assessment: Defer to OT evaluation    Lower Extremity Assessment Lower Extremity Assessment: Generalized weakness    Cervical / Trunk  Assessment Cervical / Trunk Assessment: Kyphotic  Communication   Communication Communication: No apparent difficulties  Cognition Arousal: Alert Behavior During Therapy: WFL for tasks assessed/performed Overall Cognitive Status: Within Functional Limits for tasks assessed                                          General Comments      Exercises     Assessment/Plan    PT Assessment Patient needs continued PT services  PT Problem List Decreased strength;Decreased range of motion;Decreased activity tolerance;Decreased balance;Decreased mobility;Decreased knowledge of use of DME;Pain       PT Treatment Interventions DME instruction;Gait training;Functional mobility training;Therapeutic activities;Therapeutic exercise;Stair training;Patient/family education;Balance training    PT Goals (Current goals can be found in the Care Plan section)  Acute Rehab PT Goals Patient Stated Goal: to get better. for pt to be able to walk household distances. PT Goal Formulation: With patient/family Time For Goal Achievement: 07/31/23 Potential to Achieve Goals: Good    Frequency Min 1X/week     Co-evaluation               AM-PAC PT "6 Clicks" Mobility  Outcome Measure Help needed turning from your back to your side while in a flat bed without using bedrails?: A Little Help needed moving from lying on your back to sitting on the side of a flat bed without using bedrails?: A Little Help needed moving to and from a bed to a chair (including a wheelchair)?: A Little Help needed standing up from a chair using your arms (e.g., wheelchair or bedside chair)?: A Little Help needed to walk in hospital room?: A Little Help needed climbing 3-5 steps with a railing? : A Little 6 Click Score: 18    End of Session Equipment Utilized During Treatment: Gait belt Activity Tolerance: Patient tolerated treatment well Patient left: in chair;with call [place]/phone within reach;with  family/visitor present   PT Visit Diagnosis: Muscle weakness (generalized) (M62.81);Difficulty in walking, not elsewhere classified (R26.2)    Time: 5784-6962 PT Time Calculation (min) (ACUTE ONLY): 17 min   Charges:   PT Evaluation $PT Eval Low Complexity: 1 Low   PT General Charges $$ ACUTE PT VISIT: 1 Visit           Faye Ramsay, PT Acute Rehabilitation  Office: [phone]

## 2023-07-17 NOTE — Plan of Care (Signed)
  Problem: Nutritional: Goal: Maintenance of adequate nutrition will improve Outcome: Progressing Goal: Progress toward achieving an optimal weight will improve Outcome: Progressing   Problem: Clinical Measurements: Goal: Will remain free from infection Outcome: Progressing Goal: Diagnostic test results will improve Outcome: Progressing

## 2023-07-18 ENCOUNTER — Encounter (HOSPITAL_COMMUNITY): Payer: Self-pay | Admitting: Internal Medicine

## 2023-07-18 DIAGNOSIS — H3093 Unspecified chorioretinal inflammation, bilateral: Secondary | ICD-10-CM | POA: Diagnosis not present

## 2023-07-18 DIAGNOSIS — K209 Esophagitis, unspecified without bleeding: Secondary | ICD-10-CM | POA: Diagnosis not present

## 2023-07-18 DIAGNOSIS — K269 Duodenal ulcer, unspecified as acute or chronic, without hemorrhage or perforation: Secondary | ICD-10-CM | POA: Diagnosis not present

## 2023-07-18 DIAGNOSIS — K260 Acute duodenal ulcer with hemorrhage: Secondary | ICD-10-CM | POA: Diagnosis not present

## 2023-07-18 DIAGNOSIS — K264 Chronic or unspecified duodenal ulcer with hemorrhage: Secondary | ICD-10-CM | POA: Diagnosis not present

## 2023-07-18 DIAGNOSIS — D62 Acute posthemorrhagic anemia: Secondary | ICD-10-CM | POA: Diagnosis not present

## 2023-07-18 LAB — HEMOGLOBIN AND HEMATOCRIT, BLOOD
HCT: 24.1 % — ABNORMAL LOW (ref 36.0–46.0)
HCT: 24.5 % — ABNORMAL LOW (ref 36.0–46.0)
Hemoglobin: 7.6 g/dL — ABNORMAL LOW (ref 12.0–15.0)
Hemoglobin: 8.1 g/dL — ABNORMAL LOW (ref 12.0–15.0)

## 2023-07-18 LAB — GLUCOSE, CAPILLARY
Glucose-Capillary: 109 mg/dL — ABNORMAL HIGH (ref 70–99)
Glucose-Capillary: 132 mg/dL — ABNORMAL HIGH (ref 70–99)
Glucose-Capillary: 101 mg/dL — ABNORMAL HIGH (ref 70–99)
Glucose-Capillary: 127 mg/dL — ABNORMAL HIGH (ref 70–99)

## 2023-07-18 LAB — PREPARE RBC (CROSSMATCH)

## 2023-07-18 MED ORDER — SODIUM CHLORIDE 0.9% IV SOLUTION: Freq: Once | INTRAVENOUS | Status: AC

## 2023-07-18 NOTE — Progress Notes (Signed)
[facility]  PROGRESS NOTE  Danielle LIEDTKE ZOX:[id number] DOB: [birth year] DOA: 07/15/2023 PCP: Corwin Levins, MD   Brief HPI:   89 y.o. female with medical history significant of allergic rhinitis, anxiety, depression, right eye blindness, macular degeneration, CAD, type 2 diabetes, dyspnea, GERD, hyperlipidemia, hypothyroidism, prediabetes, history of lung cancer, sarcoidosis who presented to the emergency department with complaints of, nausea, an episode of non- coffee-ground emesis and melena for the last few days associated with progressively worse dyspnea and fatigue.  No chest pain, palpitations, or lower extremity edema.  She uses ibuprofen daily multiple times for joint and left thigh pain.  : CT abdomen/pelvis with contrast with no evidence of acute pulmonary embolism.  There is interstitial thickening and peribronchial nodularity of the upper lobes and right and lower lobe suggest chronic pulmonary inflammation.  Nodules measuring up to 12 mm in the right lower lobe    Assessment/Plan:   Upper GI bleed with melena -Patient presented with black-colored stool Underwent EGD yesterday showed 3 cratered duodenal ulcer, 1 with visible vessel in the duodenal bulb, largest 10 mm not unable to clipping due to edema and stenosis.  It was successfully injected with epinephrine and cauterized.  Also noted to have benign stenosis at GE junction which was dilated from 12 to 15 mm balloon. -Started on Protonix 40 mg IV every 12 hours -Gastroenterology following; recommend to transition to PPI po bid at time of discharge. Will need to continue PPI bid x 8 weeks then every day indefinitely   Acute blood loss anemia -Hemoglobin was found to be low at 7.9; last hemoglobin from May 2024 was 12.0 -Status post 1 unit PRBC, hemoglobin improved to 8.1 this morning    Choroiditis of both eyes   Blind right eye Continue alternating 4 times daily antibiotic eyedrops on left eye. Continue timolol  drops in both eyes twice daily. -Ophthalmology consult Dr. Genia Del, as per family request.  Dr. Leretha Dykes from [facility] cannot come to the hospital as he does not have privileges at [facility].   Hypothyroidism -Continue Synthroid 100 mcg daily  Hyperlipidemia -Continue lovastatin 40 mg daily  Depression -Continue escitalopram 10 mg daily  CAD -Hold aspirin, continue statin  CKD stage IIIa -Creatinine stable  Medications     sodium chloride   Intravenous Once   Chlorhexidine Gluconate Cloth  6 each Topical Daily   citalopram  10 mg Oral Daily   gatifloxacin  1 drop Left Eye QID   levothyroxine  100 mcg Oral QAC breakfast   neomycin-polymyxin b-dexamethasone  1 drop Left Eye QID   pantoprazole (PROTONIX) IV  40 mg Intravenous Q6H   Followed by   Melene Muller ON 07/19/2023] pantoprazole (PROTONIX) IV  40 mg Intravenous Q12H   pravastatin  40 mg Oral Daily   timolol  1 drop Both Eyes BID     Data Reviewed:   CBG:  Recent Labs  Lab 07/17/23 0749 07/17/23 1204 07/17/23 1632 07/17/23 2138 07/18/23 0737  GLUCAP 97 119* 174* 92 109*    SpO2: 96 %    Vitals:   07/17/23 2000 07/17/23 2259 07/18/23 0159 07/18/23 0611  BP: (!) 138/58 137/62 126/62 136/61  Pulse: 80 89 92 81  Resp: (!) 23 16 16 16   Temp: 98.1 F (36.7 C) 98.7 F (37.1 C) 98.9 F (37.2 C) 98.2 F (36.8 C)  TempSrc: Oral Oral Oral Oral  SpO2: 97% 95% 96% 96%  Weight:      Height:  Data Reviewed:  Basic Metabolic Panel: Recent Labs  Lab 07/15/23 1446 07/16/23 0315  NA 138 141  K 3.7 3.8  CL 104 108  CO2 23 25  GLUCOSE 151* 123*  BUN 58* 52*  CREATININE 1.07* 0.96  CALCIUM 8.7* 8.7*    CBC: Recent Labs  Lab 07/15/23 1446 07/15/23 1752 07/16/23 0315 07/16/23 1037 07/16/23 1757 07/17/23 0252 07/17/23 0925 07/17/23 1731 07/18/23 0417  WBC 13.4*  --  15.1*  --   --   --   --   --   --   NEUTROABS 9.9*  --   --   --   --   --   --   --   --   HGB 8.5*   < > 9.3*   < > 8.1*  7.8* 8.2* 8.4* 7.6*  HCT 26.0*   < > 29.0*   < > 25.6* 24.5* 25.2* 26.0* 24.1*  MCV 93.2  --  92.1  --   --   --   --   --   --   PLT 283  --  255  --   --   --   --   --   --    < > = values in this interval not displayed.    LFT Recent Labs  Lab 07/15/23 1446 07/16/23 0315  AST 19 13*  ALT 11 12  ALKPHOS 39 40  BILITOT 0.6 0.8  PROT 6.4* 6.0*  ALBUMIN 3.2* 3.2*     Antibiotics: Anti-infectives (From admission, onward)    None        DVT prophylaxis: SCDs  Code Status: Full code  Family Communication: Discussed with patient's daughter at bedside   CONSULTS    Subjective   Denies any complaints.  Hemoglobin is up to 8.1 this afternoon.   Objective    Physical Examination:   General-appears in no acute distress Heart-S1-S2, regular, no murmur auscultated Lungs-clear to auscultation bilaterally, no wheezing or crackles auscultated Abdomen-soft, nontender, no organomegaly Extremities-no edema in the lower extremities Neuro-alert, oriented x3, no focal deficit noted  Status is: Inpatient:             Meredeth Ide   [facility] If 7PM-7AM, please contact night-coverage at [email or web address], Office  [phone]   07/18/2023, 9:01 AM  LOS: 2 days

## 2023-07-18 NOTE — TOC Initial Note (Signed)
Transition of Care [facility]) - Initial/Assessment Note   Patient Details  Name: Danielle Newman MRN: [id number] Date of Birth: January 11, 1932  Transition of Care [facility]) CM/SW Contact:    Ewing Schlein, LCSW Phone Number: 07/18/2023, 2:59 PM  Clinical Narrative: PT/OT evaluations recommended HH. CSW spoke with patient's daughter, Tyrone Sage, regarding recommendations and she is agreeable to [facility] being set up. CSW made [facility] referrals to the following agencies:  [facility]: no response [facility]: declined [facility]: declined [facility]: out of network [facility]: accepted  HH orders placed by hospitalist. CSW updated daughter regarding HH being set up with [facility].  Expected Discharge Plan: Home w Home Health Services Barriers to Discharge: Continued Medical Work up  Patient Goals and CMS Choice Patient states their goals for this hospitalization and ongoing recovery are:: Discharge home with [facility] Choice offered to / list presented to : Adult Children [place] ownership interest in [facility]provided to:: Adult Children   Expected Discharge Plan and Services In-house Referral: Clinical Social Work Post Acute Care Choice: Home Health Living arrangements for the past 2 months: Single Family Home           DME Arranged: N/A DME Agency: NA HH Arranged: PT, OT HH Agency: [facility] Date HH Agency Contacted: 07/18/23 Time HH Agency Contacted: 1457 Representative spoke with at [facility] Agency: Haywood Lasso  Prior Living Arrangements/Services Living arrangements for the past 2 months: Single Family Home Lives with:: Adult Children Patient language and need for interpreter reviewed:: Yes Do you feel safe going back to the place where you live?: Yes      Need for Family Participation in Patient Care: Yes (Comment) Care giver support system in place?: Yes (comment) Criminal Activity/Legal Involvement Pertinent to Current Situation/Hospitalization: No - Comment as needed  Activities of  Daily Living ADL Screening (condition at time of admission) Independently performs ADLs?: Yes (appropriate for developmental age) Is the patient deaf or have difficulty hearing?: No Does the patient have difficulty seeing, even when wearing glasses/contacts?: Yes Does the patient have difficulty concentrating, remembering, or making decisions?: No  Permission Sought/Granted Permission sought to share information with : Other (comment) Permission granted to share information with : Yes, Verbal Permission Granted Permission granted to share info w AGENCY: HH agencies  Emotional Assessment Orientation: : Oriented to Self, Oriented to Place, Oriented to  Time, Oriented to Situation Alcohol / Substance Use: Not Applicable Psych Involvement: No (comment)  Admission diagnosis:  Shortness of breath [R06.02] Melena [K92.1] Lightheadedness [R42] Nausea [R11.0] Gastrointestinal hemorrhage with melena [K92.1] Abdominal pain, unspecified abdominal location [R10.9] Fatigue, unspecified type [R53.83] Upper GI bleed [K92.2] Patient Active Problem List   Diagnosis Date Noted   Acute duodenal ulcer with hemorrhage 07/16/2023   Esophageal stricture 07/16/2023   Upper GI bleed 07/16/2023   Gastrointestinal hemorrhage with melena 07/15/2023   ABLA (acute blood loss anemia) 07/15/2023   Low back pain 12/10/2021   Bilateral impacted cerumen 10/27/2021   Presbycusis of both ears 06/12/2021   Choroiditis of both eyes 03/13/2021   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   Gastritis 02/13/2021   Weight loss 02/13/2021   Dysphagia 02/13/2021   Mass of right submandibular region 09/27/2020   Sensation of foreign body in eye 05/20/2020   Right knee pain 10/11/2019   Right shoulder pain 10/11/2019   Vitamin D deficiency 10/09/2019   Pain and swelling of right lower leg 01/27/2019   Bilateral hearing loss 05/17/2018   CKD (chronic kidney disease) stage 3, GFR 30-59  ml/min 05/16/2018   Cough 11/22/2017    Acute hearing loss, right 05/17/2017   Constipation 05/17/2017   DM (diabetes mellitus) (HCC) 11/21/2011   Blind right eye 11/20/2011   Gait disorder 11/20/2011   Leg weakness, bilateral 11/20/2011   Migraine headache 04/05/2011   Hearing loss, right 04/05/2011   Sight impaired 04/05/2011   Encounter for well adult exam with abnormal findings 04/01/2011   Macular degeneration (senile) of retina 03/29/2009   Sarcoidosis 02/11/2008   Hypothyroidism 02/11/2008   Hyperlipidemia 02/11/2008   Anxiety state 02/11/2008   Depression 02/11/2008   Coronary atherosclerosis 02/11/2008   Allergic rhinitis 02/11/2008   GERD 02/11/2008   Disorder of bone and cartilage 02/11/2008   LUNG CANCER, HX OF 02/11/2008   PCP:  Corwin Levins, MD Pharmacy:   [facility] [id number] - [place], [place] - [address] [place] [address] Phone: [phone] Fax: [phone]  Social Drivers of Health (SDOH) Social History: SDOH Screenings   Food Insecurity: No Food Insecurity (07/15/2023)  Housing: Low Risk  (07/15/2023)  Transportation Needs: No Transportation Needs (07/15/2023)  Recent Concern: Transportation Needs - Unmet Transportation Needs (07/01/2023)  Utilities: Not At Risk (07/15/2023)  Depression (PHQ2-9): Low Risk  (07/01/2023)  Financial Resource Strain: Low Risk  (07/01/2023)  Physical Activity: Unknown (07/01/2023)  Social Connections: Socially Isolated (07/01/2023)  Stress: No Stress Concern Present (07/01/2023)  Tobacco Use: Medium Risk (07/16/2023)   SDOH Interventions:    Readmission Risk Interventions     No data to display

## 2023-07-18 NOTE — Plan of Care (Signed)
Problem: Education: Goal: Ability to describe self-care measures that may prevent or decrease complications (Diabetes Survival Skills Education) will improve Outcome: Progressing   Problem: Clinical Measurements: Goal: Ability to maintain clinical measurements within normal limits will improve Outcome: Progressing   Problem: Activity: Goal: Risk for activity intolerance will decrease Outcome: Progressing   Problem: Nutrition: Goal: Adequate nutrition will be maintained Outcome: Progressing   Haydee Salter, RN 07/18/23 5:33 PM

## 2023-07-18 NOTE — Progress Notes (Signed)
Mobility Specialist - Progress Note   07/18/23 1449  Mobility  Activity Ambulated with assistance in hallway;Ambulated with assistance to bathroom  Level of Assistance Minimal assist, patient does 75% or more  Assistive Device Front wheel walker  Distance Ambulated (ft) 130 ft  Activity Response Tolerated well  Mobility Referral Yes  Mobility visit 1 Mobility   Pt eager to mobilize this afternoon and required 2 seated rest breaks d/t fatigue. At EOS pt returned to room and was left in recliner with call light within reach, all needs met, and family in room.   Arliss Journey Mobility Specialist Acute Rehabilitation Services Phone: [phone] 07/18/23, 2:52 PM

## 2023-07-18 NOTE — Progress Notes (Signed)
Received notification from CCMD that patient has two telemetry orders in computer, with most recent order to dc. Notified Dr. Sharl Ma and he requests patient be placed on tele as she will be receiving blood. Updated orders per MD and notified charge nurse. Haydee Salter, RN 07/18/23 7:01 PM

## 2023-07-18 NOTE — Evaluation (Signed)
Occupational Therapy Evaluation Patient Details Name: Danielle Newman MRN: [id number] DOB: [birth year] Today's Date: 07/18/2023   History of Present Illness 89 yr old female admitted with GI hemorrhage with melena, anemia, weakness. Hx of CAD, CKD, near blindness, DM, sarcoidosis, lung ca s/p lobectomy   Clinical Impression   The pt is currently presenting below her baseline level of functioning for self-care management, as she limited by the below listed deficits (see OT problem list). As such, she currently requires CGA to min assist for tasks, including sit to stand using a RW, lower body dressing, simulated toileting, and ambulating using a RW.  She required a few seated rest breaks during the session today, given compromised endurance/fatigue with activity. She further required directional and tactile cues, given her vision impairment. She will benefit from further OT services to maximize her safety and independence with ADLs and to facilitate her safe return home. Home health OT recommended at discharge.        If plan is discharge home, recommend the following: Help with stairs or ramp for entrance;Assist for transportation;Assistance with cooking/housework;A little help with bathing/dressing/bathroom    Functional Status Assessment  Patient has had a recent decline in their functional status and demonstrates the ability to make significant improvements in function in a reasonable and predictable amount of time.  Equipment Recommendations  BSC/3in1    Recommendations for Other Services       Precautions / Restrictions Precautions Precautions: Fall Precaution Comments: monitor HR Restrictions Weight Bearing Restrictions Per Provider Order: No Other Position/Activity Restrictions: R eye blindness and decreased vision in L eye      Mobility Bed Mobility Overal bed mobility: Needs Assistance Bed Mobility: Supine to Sit     Supine to sit: Supervision           Transfers Overall transfer level: Needs assistance Equipment used: Rolling walker (2 wheels) Transfers: Sit to/from Stand Sit to Stand: Contact guard assist           General transfer comment: Steady assist with cues for hand placement      Balance     Sitting balance-Leahy Scale: Good         Standing balance comment: CGA to min assist with RW         ADL either performed or assessed with clinical judgement   ADL Overall ADL's : Needs assistance/impaired Eating/Feeding: Set up;Sitting   Grooming: Set up;Sitting           Upper Body Dressing : Set up;Sitting   Lower Body Dressing: Minimal assistance;Sit to/from stand   Toilet Transfer: Minimal assistance;Rolling walker (2 wheels);Grab bars;Ambulation   Toileting- Clothing Manipulation and Hygiene: Minimal assistance;Sit to/from stand               Vision  Chronic R eye blindness. Decreased vision in L eye              Pertinent Vitals/Pain Pain Assessment Pain Assessment: No/denies pain        Communication Communication Communication: No apparent difficulties   Cognition Arousal: Alert Behavior During Therapy: WFL for tasks assessed/performed Overall Cognitive Status: Within Functional Limits for tasks assessed                     Home Living Family/patient expects to be discharged to:: Private residence Living Arrangements: Children (daughter) Available Help at Discharge: Family Type of Home: House Home Access: Stairs to enter Secretary/administrator of Steps: 4 Entrance Stairs-Rails:  (unilateral) Home  Layout: Able to live on main level with bedroom/bathroom;Two level Alternate Level Stairs-Number of Steps: pt resides on the main level of the home   Bathroom Shower/Tub: Tub/shower unit         Home Equipment: Cane - single Librarian, academic (2 wheels);Tub bench          Prior Functioning/Environment Prior Level of Function : Independent/Modified Independent              Mobility Comments: The pt normally ambulates without an assistive device, however very recently started using a RW. ADLs Comments: She was modified independent to independent with ADLs and light cooking.        OT Problem List: Decreased strength;Decreased activity tolerance;Impaired balance (sitting and/or standing);Impaired vision/perception;Decreased knowledge of use of DME or AE      OT Treatment/Interventions: Self-care/ADL training;Therapeutic exercise;Energy conservation;DME and/or AE instruction;Patient/family education;Balance training;Therapeutic activities    OT Goals(Current goals can be found in the care plan section) Acute Rehab OT Goals OT Goal Formulation: With patient/family Time For Goal Achievement: 08/01/23 Potential to Achieve Goals: Good ADL Goals Pt Will Perform Lower Body Dressing: with supervision;sit to/from stand;sitting/lateral leans Pt Will Transfer to Toilet: with supervision;ambulating;grab bars Pt Will Perform Toileting - Clothing Manipulation and hygiene: with supervision;sit to/from stand  OT Frequency: Min 1X/week       AM-PAC OT "6 Clicks" Daily Activity     Outcome Measure Help from another person eating meals?: A Little Help from another person taking care of personal grooming?: A Little Help from another person toileting, which includes using toliet, bedpan, or urinal?: A Little Help from another person bathing (including washing, rinsing, drying)?: A Little Help from another person to put on and taking off regular upper body clothing?: A Little Help from another person to put on and taking off regular lower body clothing?: A Little 6 Click Score: 18   End of Session Equipment Utilized During Treatment: Rolling walker (2 wheels);Gait belt Nurse Communication: Mobility status  Activity Tolerance: Patient tolerated treatment well Patient left: in chair;with call [place]/phone within reach;with chair alarm set;with family/visitor  present  OT Visit Diagnosis: Unsteadiness on feet (R26.81);Muscle weakness (generalized) (M62.81)                Time: 1100-1134 OT Time Calculation (min): 34 min Charges:  OT General Charges $OT Visit: 1 Visit OT Evaluation $OT Eval Moderate Complexity: 1 Mod    Giuseppina Quinones L Gregoire Bennis, OTR/L 07/18/2023, 1:25 PM

## 2023-07-18 NOTE — Progress Notes (Addendum)
[facility] Progress Note  CC:  Anemia, melena   Subjective: She is sitting up in the chair eating her breakfast.  Appetite is good.  No nausea or vomiting.  No abdominal pain.  No melena or BM yesterday or thus far this morning.  No chest pain or shortness of breath.  Her daughter Hansel Starling is at the bedside.  Objective:   EGD 07/16/2023: Oozing duodenal ulcer with a visible vessel. Injected w/ 3.5 cc EPI 1:10K. Treated with bipolar cautery x.3 and then Purastat  Two other smaller clean-based ulcers There is associated edema and stenosis  Ulcers collected at D1/D2 junction and were acute (soft). Benign-appearing esophageal stenosis. Dilated to 15 nm  There is associated Grade C esophagitis thought from reflux  Deformity in the entire stomach. Extreme J-shape  The examination was otherwise normal.  Biopsies were taken with a cold forceps for histology in the gastric body and in the gastric antrum to assess for H pylori   Vital signs in last 24 hours: Temp:  [97.9 F (36.6 C)-98.9 F (37.2 C)] 98.2 F (36.8 C) (12/19 0611) Pulse Rate:  [65-145] 81 (12/19 0611) Resp:  [13-26] 16 (12/19 0611) BP: (107-160)/(42-79) 136/61 (12/19 0611) SpO2:  [95 %-98 %] 96 % (12/19 0611) Last BM Date : 07/15/23 General: Alert 89 year old female legally blind sitting up in the chair no acute distress. Heart: Regular rate and rhythm, no murmurs. Pulm: Breath sounds clear throughout. Abdomen: Soft, nondistended.  Nontender.  Positive bowel sounds to all 4 quadrants. Extremities: No lower extremity edema. Neurologic:  Alert and  oriented x 4.  Speech is clear.  Moves all extremities equally. Psych:  Alert and cooperative. Normal mood and affect.  Intake/Output from previous day: 12/18 0701 - 12/19 0700 In: -  Out: 500 [Urine:500] Intake/Output this shift: No intake/output data recorded.  Lab Results: Recent Labs    07/15/23 1446 07/15/23 1752 07/16/23 0315 07/16/23 1037  07/17/23 0925 07/17/23 1731 07/18/23 0417  WBC 13.4*  --  15.1*  --   --   --   --   HGB 8.5*   < > 9.3*   < > 8.2* 8.4* 7.6*  HCT 26.0*   < > 29.0*   < > 25.2* 26.0* 24.1*  PLT 283  --  255  --   --   --   --    < > = values in this interval not displayed.   BMET Recent Labs    07/15/23 1446 07/16/23 0315  NA 138 141  K 3.7 3.8  CL 104 108  CO2 23 25  GLUCOSE 151* 123*  BUN 58* 52*  CREATININE 1.07* 0.96  CALCIUM 8.7* 8.7*   LFT Recent Labs    07/16/23 0315  PROT 6.0*  ALBUMIN 3.2*  AST 13*  ALT 12  ALKPHOS 40  BILITOT 0.8   PT/INR Recent Labs    07/17/23 0252  LABPROT 15.3*  INR 1.2     Patient Profile: 89 year old female with a past medical history of anxiety, depression, macular degeneration with right eye blindness, CAD, diabetes mellitus type 2, CKD, hypothyroidism, lung cancer and sarcoidosis admitted to the hospital 07/15/2023 with melena and anemia.  Assessment / Plan:  89 year old female admitted 07/15/2023 with symptomatic anemia with UGI bleed/melena. + NSAID use. Admission Hg 8.5 -> 7.9 -> transfused one unit of PRBCs on 12/16 -> Hg 9.3 -> 8.4 -> 4am today Hg 7.6.  CTAP showed a normal-appearing esophagus  and a small hiatal hernia. EGD 12/17 showed a benign appearing esophageal stenosis which was dilated, grade C esophagitis, an oozing duodenal ulcer with a visible vessel in the duodenal bulb, treated with epi, bipolar cautery x 3 and Purastat. Two other small clean duodenal ulcers noted with associated edema and stenosis. Path report negative for H. Pylori.  No melena or BM yesterday or thus far this morning.  Hemodynamically stable. -Next H/H due at noon -Transfuse for Hg < 8 -Continue PPI IV Q 6 hours, then IV bid on 12/20, transition to PPI po bid at time of discharge. Will need to continue PPI bid x 8 weeks then every day indefinitely  -Avoid NSAIDs  -Soft diet  -Await further recommendations per Dr. Leone Payor  Acute anemia secondary to GI  bleed -See plan above   CAD   CKD  History of lung cancer status post lobectomy. CT showed interstitial thickening and peribronchial nodularity in the upper lobes suggesting chronic pulmonary infection and similar findings in the right lower lobe, nodules measuring up to 12 mm in the right lower lobe.   Legal blindness, patient is having current issues with her left eye and had a mesh placed by her ophthalmologist last week  DM type II  Principal Problem:   Gastrointestinal hemorrhage with melena Active Problems:   Hypothyroidism   Hyperlipidemia   Depression   Coronary atherosclerosis   GERD   Blind right eye   DM (diabetes mellitus) (HCC)   CKD (chronic kidney disease) stage 3, GFR 30-59 ml/min   Choroiditis of both eyes   ABLA (acute blood loss anemia)   Acute duodenal ulcer with hemorrhage   Esophageal stricture   Upper GI bleed     LOS: 2 days   Malachi Elayjah Chaney Kennedy-Smith  07/18/2023, 8:58 AM    [facility] Attending   I have taken an interval history, reviewed the chart and examined the patient. I agree with the Advanced Practitioner's note, impression and recommendations with the following additions:  She is improved and bleeding appears to have stopped but remains weak. I think another unit of RBC would be very helpful and recommend that today and dc tomorrow on bid PPI po.  I am arranging an outpatient follow-up with me  Iva Boop, MD, [facility] See Loretha Stapler on call - gastroenterology for best contact person 07/18/2023 1:45 PM

## 2023-07-18 NOTE — Progress Notes (Signed)
Physical Therapy Treatment Patient Details Name: Danielle Newman MRN: [id number] DOB: 12-27-1931 Today's Date: 07/18/2023   History of Present Illness 89 yo female admitted with GI hemorrhage with melena, anemia, weakness. 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  Pt up to ambulate in hall but requiring seated rest breaks ~ every 15' 2* fatigue.  Dtr expressing concerns regarding stairs to enter home.    If plan is discharge home, recommend the following: A little help with walking and/or transfers;A little help with bathing/dressing/bathroom;Assistance with cooking/housework;Assist for transportation;Help with stairs or ramp for entrance   Can travel by private vehicle        Equipment Recommendations  None recommended by PT    Recommendations for Other Services       Precautions / Restrictions Precautions Precautions: Fall Precaution Comments: monitor HR Restrictions Weight Bearing Restrictions Per Provider Order: No     Mobility  Bed Mobility Overal bed mobility: Needs Assistance Bed Mobility: Supine to Sit     Supine to sit: Supervision     General bed mobility comments: for safety    Transfers Overall transfer level: Needs assistance Equipment used: Rolling walker (2 wheels) Transfers: Sit to/from Stand Sit to Stand: Contact guard assist           General transfer comment: Steady assist with cues for hand placement    Ambulation/Gait Ambulation/Gait assistance: Min assist Gait Distance (Feet): 45 Feet (Pt ambulated 15' x 3 with seated rest breaks 2* fatigue) Assistive device: Rolling walker (2 wheels) Gait Pattern/deviations: Step-to pattern, Step-through pattern, Decreased step length - right, Decreased step length - left, Shuffle, Trunk flexed Gait velocity: decr     General Gait Details: Steady assist to stabilize pt throughout  distance and to manage RW. Directional and safey cues.   Stairs             Wheelchair Mobility     Tilt Bed    Modified Rankin (Stroke Patients Only)       Balance Overall balance assessment: Needs assistance Sitting-balance support: No upper extremity supported, Feet supported Sitting balance-Leahy Scale: Good     Standing balance support: Bilateral upper extremity supported, Reliant on assistive device for balance, During functional activity Standing balance-Leahy Scale: Poor                              Cognition Arousal: Alert Behavior During Therapy: WFL for tasks assessed/performed Overall Cognitive Status: Within Functional Limits for tasks assessed                                          Exercises      General Comments        Pertinent Vitals/Pain Pain Assessment Pain Assessment: No/denies pain    Home Living                          Prior Function            PT Goals (current goals can now be found in the care plan section) Acute Rehab PT Goals Patient Stated Goal: to get better. for pt to be able to walk household distances. PT Goal Formulation: With patient/family Time  For Goal Achievement: 07/31/23 Potential to Achieve Goals: Good Progress towards PT goals: Progressing toward goals    Frequency    Min 1X/week      PT Plan      Co-evaluation              AM-PAC PT "6 Clicks" Mobility   Outcome Measure  Help needed turning from your back to your side while in a flat bed without using bedrails?: A Little Help needed moving from lying on your back to sitting on the side of a flat bed without using bedrails?: A Little Help needed moving to and from a bed to a chair (including a wheelchair)?: A Little Help needed standing up from a chair using your arms (e.g., wheelchair or bedside chair)?: A Little Help needed to walk in hospital room?: A Little Help needed climbing 3-5 steps with  a railing? : A Little 6 Click Score: 18    End of Session Equipment Utilized During Treatment: Gait belt Activity Tolerance: Patient tolerated treatment well Patient left: in chair;with call Gose/phone within reach;with family/visitor present Nurse Communication: Mobility status PT Visit Diagnosis: Muscle weakness (generalized) (M62.81);Difficulty in walking, not elsewhere classified (R26.2)     Time: 1610-9604 PT Time Calculation (min) (ACUTE ONLY): 27 min  Charges:    $Gait Training: 8-22 mins PT General Charges $$ ACUTE PT VISIT: 1 Visit                     Mauro Kaufmann PT Acute Rehabilitation Services Pager [phone] Office [phone]    Tyreese Thain 07/18/2023, 12:29 PM

## 2023-07-18 NOTE — Plan of Care (Signed)
  Problem: Education: Goal: Ability to describe self-care measures that may prevent or decrease complications (Diabetes Survival Skills Education) will improve Outcome: Progressing Goal: Individualized Educational Video(s) Outcome: Progressing   Problem: Coping: Goal: Ability to adjust to condition or change in health will improve Outcome: Progressing   

## 2023-07-19 DIAGNOSIS — E1165 Type 2 diabetes mellitus with hyperglycemia: Secondary | ICD-10-CM

## 2023-07-19 DIAGNOSIS — R109 Unspecified abdominal pain: Secondary | ICD-10-CM | POA: Diagnosis not present

## 2023-07-19 DIAGNOSIS — R5383 Other fatigue: Secondary | ICD-10-CM

## 2023-07-19 DIAGNOSIS — K921 Melena: Secondary | ICD-10-CM | POA: Diagnosis not present

## 2023-07-19 DIAGNOSIS — R42 Dizziness and giddiness: Secondary | ICD-10-CM

## 2023-07-19 LAB — BPAM RBC
Blood Product Expiration Date: 202501122359
Blood Product Expiration Date: 202501122359
Blood Product Expiration Date: 202501122359
ISSUE DATE / TIME: 202412162105
ISSUE DATE / TIME: 202412192253
ISSUE DATE / TIME: 202412200730
Unit Type and Rh: 6200
Unit Type and Rh: 6200
Unit Type and Rh: 6200

## 2023-07-19 LAB — CBC
HCT: 29.5 % — ABNORMAL LOW (ref 36.0–46.0)
Hemoglobin: 9.3 g/dL — ABNORMAL LOW (ref 12.0–15.0)
MCH: 28.8 pg (ref 26.0–34.0)
MCHC: 31.5 g/dL (ref 30.0–36.0)
MCV: 91.3 fL (ref 80.0–100.0)
Platelets: 249 10*3/uL (ref 150–400)
RBC: 3.23 MIL/uL — ABNORMAL LOW (ref 3.87–5.11)
RDW: 16.5 % — ABNORMAL HIGH (ref 11.5–15.5)
WBC: 9.8 10*3/uL (ref 4.0–10.5)
nRBC: 0 % (ref 0.0–0.2)

## 2023-07-19 LAB — TYPE AND SCREEN
ABO/RH(D): A POS
Antibody Screen: NEGATIVE
Unit division: 0
Unit division: 0
Unit division: 0

## 2023-07-19 LAB — GLUCOSE, CAPILLARY
Glucose-Capillary: 94 mg/dL (ref 70–99)
Glucose-Capillary: 124 mg/dL — ABNORMAL HIGH (ref 70–99)

## 2023-07-19 MED ORDER — PANTOPRAZOLE SODIUM 40 MG PO TBEC: 40.0000 mg | DELAYED_RELEASE_TABLET | Freq: Every day | ORAL | 11 refills | Status: DC

## 2023-07-19 NOTE — Progress Notes (Signed)
Mobility Specialist - Progress Note   07/19/23 1011  Mobility  Activity Ambulated with assistance in hallway;Ambulated with assistance to bathroom  Level of Assistance Contact guard assist, steadying assist  Assistive Device Front wheel walker  Distance Ambulated (ft) 100 ft  Activity Response Tolerated well  Mobility Referral Yes  Mobility visit 1 Mobility  Mobility Specialist Start Time (ACUTE ONLY) 0945  Mobility Specialist Stop Time (ACUTE ONLY) 1008  Mobility Specialist Time Calculation (min) (ACUTE ONLY) 23 min   Pt received in bed and agreeable to mobility. Prior to ambulating, pt requested assistance to the bathroom. Once in hallway, pt took x1 seated rest break d/t fatigue. No complaints during session. Pt to recliner after session with all needs met.    [facility]

## 2023-07-19 NOTE — Discharge Summary (Signed)
Physician Discharge Summary   Patient: Danielle Newman MRN: [id number] DOB: 1931-09-02  Admit date:     07/15/2023  Discharge date: 07/19/23  Discharge Physician: Meredeth Ide   PCP: Corwin Levins, MD   Recommendations at discharge:   Hold aspirin for 2 weeks then start taking from 08/02/2023 Take Protonix 40 mg p.o. twice daily for 2 weeks then take once daily indefinitely from 08/02/2023  Discharge Diagnoses: Principal Problem:   Gastrointestinal hemorrhage with melena Active Problems:   DM (diabetes mellitus) (HCC)   Hypothyroidism   Hyperlipidemia   Depression   Coronary atherosclerosis   GERD   Blind right eye   CKD (chronic kidney disease) stage 3, GFR 30-59 ml/min   Choroiditis of both eyes   ABLA (acute blood loss anemia)   Acute duodenal ulcer with hemorrhage   Esophageal stricture   Upper GI bleed  Resolved Problems:   * No resolved hospital problems. *  Hospital Course: 89 y.o. female with medical history significant of allergic rhinitis, anxiety, depression, right eye blindness, macular degeneration, CAD, type 2 diabetes, dyspnea, GERD, hyperlipidemia, hypothyroidism, prediabetes, history of lung cancer, sarcoidosis who presented to the emergency department with complaints of, nausea, an episode of non- coffee-ground emesis and melena for the last few days associated with progressively worse dyspnea and fatigue.  No chest pain, palpitations, or lower extremity edema.  She uses ibuprofen daily multiple times for joint and left thigh pain.  : CT abdomen/pelvis with contrast with no evidence of acute pulmonary embolism.  There is interstitial thickening and peribronchial nodularity of the upper lobes and right and lower lobe suggest chronic pulmonary inflammation.  Nodules measuring up to 12 mm in the right lower lobe   Assessment and Plan:  Upper GI bleed with melena -Patient presented with black-colored stool Underwent EGD yesterday showed 3 cratered duodenal ulcer,  1 with visible vessel in the duodenal bulb, largest 10 mm not unable to clipping due to edema and stenosis.  It was successfully injected with epinephrine and cauterized.  Also noted to have benign stenosis at GE junction which was dilated from 12 to 15 mm balloon. -Started on Protonix 40 mg IV every 12 hours -Gastroenterology following; recommend to transition to PPI po bid at time of discharge. Will need to continue PPI bid x 8 weeks then every day indefinitely  -Gastroenterology recommends to hold aspirin for 2 weeks and then start taking daily   Acute blood loss anemia -Hemoglobin was found to be low at 7.9; last hemoglobin from May 2024 was 12.0 -Status post 2 units PRBC, hemoglobin has improved to 9.3   History of lung cancer status post lobectomy -CT showed interstitial thickening and peribronchial nodularity in the upper lobes suggesting chronic pulmonary infection and similar findings in the right lower lobe, nodules measuring up to 12 mm in the right lower lobe.  -Patient is asymptomatic, not requiring oxygen -Discussed with ID Dr. Algis Liming, no need for treatment for possible MAI as patient is asymptomatic -Follow-up with PCP for further management for lung nodules which seems to be chronic, has been present since 2008   Choroiditis of both eyes   Blind right eye Continue alternating 4 times daily antibiotic eyedrops on left eye. Continue timolol drops in both eyes twice daily. -Ophthalmology consult Dr. Genia Del, as per family request.  Dr. Leretha Dykes from [place] [facility] cannot come to the hospital as he does not have privileges at [facility].   Hypothyroidism -Continue Synthroid 100 mcg daily   Hyperlipidemia -  Continue lovastatin 40 mg daily   Depression -Continue escitalopram 10 mg daily   CAD -Hold aspirin for 2 weeks, resume from 08/02/2023 continue statin   CKD stage IIIa -Creatinine stable       Consultants: Gastroenterology Procedures performed: EGD Disposition: Home Diet  recommendation:  Discharge Diet Orders (From admission, onward)     Start     Ordered   07/19/23 0000  Diet - low sodium heart healthy        07/19/23 0957           Regular diet DISCHARGE MEDICATION: Allergies as of 07/19/2023       Reactions   Fioricet [butalbital-apap-caffeine] Other (See Comments)   Confusion   Meperidine Hcl Other (See Comments)   Reaction not noted        Medication List     STOP taking these medications    citalopram 10 MG tablet Commonly known as: CeleXA       TAKE these medications    Acidophilus Lactobacillus Caps Take 1 capsule by mouth daily.   albuterol 108 (90 Base) MCG/ACT inhaler Commonly known as: VENTOLIN HFA Inhale 2 puffs into the lungs every 6 (six) hours as needed for wheezing or shortness of breath.   AMBULATORY NON FORMULARY MEDICATION Custom shoe lifts to treat acquired leg length discrepancy. [facility]   Fax # [phone]   aspirin EC 81 MG tablet Take 81 mg by mouth daily. Swallow whole.   ciclopirox 8 % solution Commonly known as: PENLAC Apply topically at bedtime. Apply over nail and surrounding skin. Apply daily over previous coat. After seven (7) days, may remove with alcohol and continue cycle.   cyclobenzaprine 5 MG tablet Commonly known as: FLEXERIL Take 1 tablet (5 mg total) by mouth 3 (three) times daily as needed for muscle spasms.   levothyroxine 100 MCG tablet Commonly known as: SYNTHROID TAKE 1 TABLET BY MOUTH DAILY What changed: when to take this   lovastatin 40 MG tablet Commonly known as: MEVACOR TAKE 1 TABLET BY MOUTH DAILY   moxifloxacin 0.5 % ophthalmic solution Commonly known as: VIGAMOX Place 1 drop into the left eye See admin instructions. Instill 1 drop into the left eye four times a day, alternating with generic Maxitrol- until the next office visit Administration times: 0800/1200/1600/2000   neomycin-polymyxin b-dexamethasone 3.5-10000-0.1 Susp Commonly known as:  MAXITROL Place 1 drop into the left eye See admin instructions. Instill 1 drop into the left eye four times a day, alternating with generic Vigamox- until the next office visit Administration Times: 0600/1000/1400/1800   pantoprazole 40 MG tablet Commonly known as: PROTONIX Take 1 tablet (40 mg total) by mouth daily. Take Protonix 40 mg po twice daily for 2 weeks , then take Protonix 40 mg po daily What changed:  when to take this additional instructions   Refresh Tears PF 0.5-0.9 % Soln Generic drug: Carboxymethylcell-Glycerin PF Place 1 drop into both eyes See admin instructions. Instill 1 drop into affected eye(s) up to six times a day as needed for matted appearance   timolol 0.5 % ophthalmic solution Commonly known as: TIMOPTIC INSTILL ONE DROP INTO EACH EYE TWO TIMES A DAY What changed: See the new instructions.   triamcinolone 55 MCG/ACT Aero nasal inhaler Commonly known as: NASACORT Place 2 sprays into the nose daily.   Vitamin D3 50 MCG (2000 UT) Tabs Take 2,000 Units by mouth daily.        Follow-up Information     [facility]   Why: [facility] will provide PT and OT in the home after discharge.        Iva Boop, MD. Schedule an appointment as soon as possible for a visit.   Specialty: Gastroenterology Contact information: [address] [place] [address] [phone]                Discharge Exam: Ceasar Mons Weights   07/15/23 1134 07/15/23 2000  Weight: 68 kg 65.6 kg   General-appears in no acute distress Heart-S1-S2, regular, no murmur auscultated Lungs-clear to auscultation bilaterally, no wheezing or crackles auscultated Abdomen-soft, nontender, no organomegaly Extremities-no edema in the lower extremities Neuro-alert, oriented x3, no focal deficit noted  Condition at discharge: good  The results of significant diagnostics from this hospitalization (including imaging, microbiology, ancillary and  laboratory) are listed below for reference.   Imaging Studies: CT ABDOMEN PELVIS W CONTRAST Result Date: 07/15/2023 CLINICAL DATA:  High probability pulmonary embolism. EXAM: CT ANGIOGRAPHY CHEST CT ABDOMEN AND PELVIS WITH CONTRAST TECHNIQUE: Multidetector CT imaging of the chest was performed using the standard protocol during bolus administration of intravenous contrast. Multiplanar CT image reconstructions and MIPs were obtained to evaluate the vascular anatomy. Multidetector CT imaging of the abdomen and pelvis was performed using the standard protocol during bolus administration of intravenous contrast. RADIATION DOSE REDUCTION: This exam was performed according to the departmental dose-optimization program which includes automated exposure control, adjustment of the mA and/or kV according to patient size and/or use of iterative reconstruction technique. CONTRAST:  OMNIPAQUE IOHEXOL 350 MG/ML SOLN COMPARISON:  None Available. FINDINGS: CTA CHEST FINDINGS Cardiovascular: No filling defects within the pulmonary arteries to suggest acute pulmonary embolism. Mediastinum/Nodes: No axillary or supraclavicular adenopathy. No mediastinal or hilar adenopathy. No pericardial fluid. Esophagus normal. Lungs/Pleura: Interstitial thickening peribronchial nodularity in the upper lobes suggest chronic pulmonary infection. Similar findings in the RIGHT lower lobe. Nodules measure up to 12 mm in RIGHT lower lobe (image 87/series 12) these findings were subtly present on comparison CT from 2008. Significant progression in the interval. Musculoskeletal: Multiple healed LEFT rib fractures. Acute findings. CT ABDOMEN AND PELVIS FINDINGS Hepatobiliary: No focal hepatic lesion. No biliary ductal dilatation. Gallbladder is normal. Common bile duct is normal. Pancreas: Pancreas is normal. No ductal dilatation. No pancreatic inflammation. Spleen: Low-density 2.5 cm lesion in the spleen is favored benign cysts or hemangioma.  Adrenals/urinary tract: enlargement of the LEFT adrenal gland to 16 mm. These enlarged gland has low density on the CT of the chest consistent benign adenoma. RIGHT adrenal gland normal. Several benign low-density cyst of the LEFT or RIGHT kidney. No follow-up recommended for benign renal lesion. No obstructive uropathy. Ureters and bladder normal. Stomach/Bowel: Small hiatal hernia. Stomach, small bowel, appendix, and cecum are normal. Multiple diverticula of the descending colon and sigmoid colon without acute inflammation. Vascular/Lymphatic: Abdominal aorta is normal caliber with atherosclerotic calcification. There is no retroperitoneal or periportal lymphadenopathy. No pelvic lymphadenopathy. Reproductive: Uterus and adnexa unremarkable. Other: No free fluid. Musculoskeletal: No aggressive osseous lesion. Review of the MIP images confirms the above findings. IMPRESSION: CHEST: 1. No evidence acute pulmonary embolism. 2. Interstitial thickening and peribronchial nodularity in the upper lobes and RIGHT and lower lobe suggest chronic pulmonary infection (e.g. MIA). Nodules measure up to 12 mm in the RIGHT lower lobe. Subtle findings in the RIGHT lung on CT from 2008 with interval progression PELVIS: 1. No acute findings in the abdomen pelvis. 2. Diverticulosis without diverticulitis. 3. Benign LEFT adrenal  adenoma. 4.  Aortic Atherosclerosis (ICD10-I70.0). Electronically Signed   By: Genevive Bi M.D.   On: 07/15/2023 16:58   CT Angio Chest PE W and/or Wo Contrast Result Date: 07/15/2023 CLINICAL DATA:  High probability pulmonary embolism. EXAM: CT ANGIOGRAPHY CHEST CT ABDOMEN AND PELVIS WITH CONTRAST TECHNIQUE: Multidetector CT imaging of the chest was performed using the standard protocol during bolus administration of intravenous contrast. Multiplanar CT image reconstructions and MIPs were obtained to evaluate the vascular anatomy. Multidetector CT imaging of the abdomen and pelvis was performed using  the standard protocol during bolus administration of intravenous contrast. RADIATION DOSE REDUCTION: This exam was performed according to the departmental dose-optimization program which includes automated exposure control, adjustment of the mA and/or kV according to patient size and/or use of iterative reconstruction technique. CONTRAST:  OMNIPAQUE IOHEXOL 350 MG/ML SOLN COMPARISON:  None Available. FINDINGS: CTA CHEST FINDINGS Cardiovascular: No filling defects within the pulmonary arteries to suggest acute pulmonary embolism. Mediastinum/Nodes: No axillary or supraclavicular adenopathy. No mediastinal or hilar adenopathy. No pericardial fluid. Esophagus normal. Lungs/Pleura: Interstitial thickening peribronchial nodularity in the upper lobes suggest chronic pulmonary infection. Similar findings in the RIGHT lower lobe. Nodules measure up to 12 mm in RIGHT lower lobe (image 87/series 12) these findings were subtly present on comparison CT from 2008. Significant progression in the interval. Musculoskeletal: Multiple healed LEFT rib fractures. Acute findings. CT ABDOMEN AND PELVIS FINDINGS Hepatobiliary: No focal hepatic lesion. No biliary ductal dilatation. Gallbladder is normal. Common bile duct is normal. Pancreas: Pancreas is normal. No ductal dilatation. No pancreatic inflammation. Spleen: Low-density 2.5 cm lesion in the spleen is favored benign cysts or hemangioma. Adrenals/urinary tract: enlargement of the LEFT adrenal gland to 16 mm. These enlarged gland has low density on the CT of the chest consistent benign adenoma. RIGHT adrenal gland normal. Several benign low-density cyst of the LEFT or RIGHT kidney. No follow-up recommended for benign renal lesion. No obstructive uropathy. Ureters and bladder normal. Stomach/Bowel: Small hiatal hernia. Stomach, small bowel, appendix, and cecum are normal. Multiple diverticula of the descending colon and sigmoid colon without acute inflammation.  Vascular/Lymphatic: Abdominal aorta is normal caliber with atherosclerotic calcification. There is no retroperitoneal or periportal lymphadenopathy. No pelvic lymphadenopathy. Reproductive: Uterus and adnexa unremarkable. Other: No free fluid. Musculoskeletal: No aggressive osseous lesion. Review of the MIP images confirms the above findings. IMPRESSION: CHEST: 1. No evidence acute pulmonary embolism. 2. Interstitial thickening and peribronchial nodularity in the upper lobes and RIGHT and lower lobe suggest chronic pulmonary infection (e.g. MIA). Nodules measure up to 12 mm in the RIGHT lower lobe. Subtle findings in the RIGHT lung on CT from 2008 with interval progression PELVIS: 1. No acute findings in the abdomen pelvis. 2. Diverticulosis without diverticulitis. 3. Benign LEFT adrenal adenoma. 4.  Aortic Atherosclerosis (ICD10-I70.0). Electronically Signed   By: Genevive Bi M.D.   On: 07/15/2023 16:58    Microbiology: Results for orders placed or performed during the hospital encounter of 07/15/23  Resp panel by RT-PCR (RSV, Flu A&B, Covid) Anterior Nasal Swab     Status: None   Collection Time: 07/15/23  2:16 PM   Specimen: Anterior Nasal Swab  Result Value Ref Range Status   SARS Coronavirus 2 by RT PCR NEGATIVE NEGATIVE Final    Comment: (NOTE) SARS-CoV-2 target nucleic acids are NOT DETECTED.  The SARS-CoV-2 RNA is generally detectable in upper respiratory specimens during the acute phase of infection. The lowest concentration of SARS-CoV-2 viral copies this assay can detect  is 138 copies/mL. A negative result does not preclude SARS-Cov-2 infection and should not be used as the sole basis for treatment or other patient management decisions. A negative result may occur with  improper specimen collection/handling, submission of specimen other than nasopharyngeal swab, presence of viral mutation(s) within the areas targeted by this assay, and inadequate number of viral copies(<138  copies/mL). A negative result must be combined with clinical observations, patient history, and epidemiological information. The expected result is Negative.  Fact Sheet for Patients:  BloggerCourse.com  Fact Sheet for Healthcare Providers:  SeriousBroker.it  This test is no t yet approved or cleared by the [place] FDA and  has been authorized for detection and/or diagnosis of SARS-CoV-2 by FDA under an Emergency Use Authorization (EUA). This EUA will remain  in effect (meaning this test can be used) for the duration of the COVID-19 declaration under Section 564(b)(1) of the Act, 21 U.S.C.section 360bbb-3(b)(1), unless the authorization is terminated  or revoked sooner.       Influenza A by PCR NEGATIVE NEGATIVE Final   Influenza B by PCR NEGATIVE NEGATIVE Final    Comment: (NOTE) The Xpert Xpress SARS-CoV-2/FLU/RSV plus assay is intended as an aid in the diagnosis of influenza from Nasopharyngeal swab specimens and should not be used as a sole basis for treatment. Nasal washings and aspirates are unacceptable for Xpert Xpress SARS-CoV-2/FLU/RSV testing.  Fact Sheet for Patients: BloggerCourse.com  Fact Sheet for Healthcare Providers: SeriousBroker.it  This test is not yet approved or cleared by the [place] FDA and has been authorized for detection and/or diagnosis of SARS-CoV-2 by FDA under an Emergency Use Authorization (EUA). This EUA will remain in effect (meaning this test can be used) for the duration of the COVID-19 declaration under Section 564(b)(1) of the Act, 21 U.S.C. section 360bbb-3(b)(1), unless the authorization is terminated or revoked.     Resp Syncytial Virus by PCR NEGATIVE NEGATIVE Final    Comment: (NOTE) Fact Sheet for Patients: BloggerCourse.com  Fact Sheet for Healthcare  Providers: SeriousBroker.it  This test is not yet approved or cleared by the [place] FDA and has been authorized for detection and/or diagnosis of SARS-CoV-2 by FDA under an Emergency Use Authorization (EUA). This EUA will remain in effect (meaning this test can be used) for the duration of the COVID-19 declaration under Section 564(b)(1) of the Act, 21 U.S.C. section 360bbb-3(b)(1), unless the authorization is terminated or revoked.  Performed at [facility], [address] [place], [place] [address]     Labs: CBC: Recent Labs  Lab 07/15/23 1446 07/15/23 1752 07/16/23 0315 07/16/23 1037 07/17/23 0925 07/17/23 1731 07/18/23 0417 07/18/23 1216 07/19/23 0340  WBC 13.4*  --  15.1*  --   --   --   --   --  9.8  NEUTROABS 9.9*  --   --   --   --   --   --   --   --   HGB 8.5*   < > 9.3*   < > 8.2* 8.4* 7.6* 8.1* 9.3*  HCT 26.0*   < > 29.0*   < > 25.2* 26.0* 24.1* 24.5* 29.5*  MCV 93.2  --  92.1  --   --   --   --   --  91.3  PLT 283  --  255  --   --   --   --   --  249   < > = values in this interval not displayed.  Basic Metabolic Panel: Recent Labs  Lab 07/15/23 1446 07/16/23 0315  NA 138 141  K 3.7 3.8  CL 104 108  CO2 23 25  GLUCOSE 151* 123*  BUN 58* 52*  CREATININE 1.07* 0.96  CALCIUM 8.7* 8.7*   Liver Function Tests: Recent Labs  Lab 07/15/23 1446 07/16/23 0315  AST 19 13*  ALT 11 12  ALKPHOS 39 40  BILITOT 0.6 0.8  PROT 6.4* 6.0*  ALBUMIN 3.2* 3.2*   CBG: Recent Labs  Lab 07/18/23 0737 07/18/23 1143 07/18/23 1618 07/18/23 2133 07/19/23 0734  GLUCAP 109* 132* 101* 127* 94    Discharge time spent: greater than 30 minutes.  Signed: Meredeth Ide, MD [facility] 07/19/2023

## 2023-07-19 NOTE — Plan of Care (Signed)
Patient discharging home via private vehicle with daughter. AVS and discharge instruction provided. Patient and daughter verbalize understanding. Awaiting delivery of rolling walker to patient's room. Haydee Salter, RN 07/19/23 12:28 PM

## 2023-07-19 NOTE — Progress Notes (Addendum)
[facility] Progress Note  CC:   Anemia, melena   Subjective: She slept well last night.  No nausea, vomiting or abdominal pain.  She passed a dark brown stool yesterday, no melena or rectal bleeding.  No chest pain or shortness of breath.  She stated she is comfortable going home today and she wants to take the telemetry monitor off at this time.  No family at the bedside.   Objective:   EGD 07/16/2023: Oozing duodenal ulcer with a visible vessel. Injected w/ 3.5 cc EPI 1:10K. Treated with bipolar cautery x.3 and then Purastat  Two other smaller clean-based ulcers There is associated edema and stenosis  Ulcers collected at D1/D2 junction and were acute (soft). Benign-appearing esophageal stenosis. Dilated to 15 nm  There is associated Grade C esophagitis thought from reflux  Deformity in the entire stomach. Extreme J-shape  The examination was otherwise normal.  Biopsies were taken with a cold forceps for histology in the gastric body and in the gastric antrum to assess for H pylori.  Vital signs in last 24 hours: Temp:  [97.5 F (36.4 C)-98.9 F (37.2 C)] 98 F (36.7 C) (12/20 0503) Pulse Rate:  [74-90] 90 (12/20 0503) Resp:  [14-20] 15 (12/20 0503) BP: (100-140)/(48-68) 133/67 (12/20 0503) SpO2:  [95 %-97 %] 96 % (12/20 0503) Last BM Date : 07/18/23 General: 89 year old female visually impaired in no acute distress. Heart: Regular rate and rhythm, no murmurs. Pulm: Breath sounds clear throughout. Abdomen: Soft, nondistended.  Nontender.  Positive bowel sounds all 4 quadrants. Extremities: No lower extremity edema. Neurologic:  Alert and  oriented x 4.  Moves all extremities equally. Psych:  Alert and cooperative. Normal mood and affect.  Intake/Output from previous day: 12/19 0701 - 12/20 0700 In: 1221.7 [P.O.:890; Blood:331.7] Out: -  Intake/Output this shift: No intake/output data recorded.  Lab Results: Recent Labs    07/18/23 0417  07/18/23 1216 07/19/23 0340  WBC  --   --  9.8  HGB 7.6* 8.1* 9.3*  HCT 24.1* 24.5* 29.5*  PLT  --   --  249   BMET No results for input(s): "NA", "K", "CL", "CO2", "GLUCOSE", "BUN", "CREATININE", "CALCIUM" in the last 72 hours. LFT No results for input(s): "PROT", "ALBUMIN", "AST", "ALT", "ALKPHOS", "BILITOT", "BILIDIR", "IBILI" in the last 72 hours. PT/INR Recent Labs    07/17/23 0252  LABPROT 15.3*  INR 1.2   Hepatitis Panel No results for input(s): "HEPBSAG", "HCVAB", "HEPAIGM", "HEPBIGM" in the last 72 hours.  No results found.  Assessment / Plan:  89 year old female admitted 07/15/2023 with symptomatic anemia with UGI bleed/melena. + NSAID use. Admission Hg 8.5 -> 7.9 -> transfused one unit of PRBCs on 12/16 -> Hg 9.3 -> 8.4 -> Hg 7.6 -> 8.1 on 12/19 -> transfused 1 unit of PRBCs due to ongoing weakness, no further melena -> today Hg 9.3 . CTAP showed a normal-appearing esophagus and a small hiatal hernia. EGD 12/17 showed a benign appearing esophageal stenosis which was dilated, grade C esophagitis, an oozing duodenal ulcer with a visible vessel in the duodenal bulb, treated with epi, bipolar cautery x 3 and Purastat. Two other small clean duodenal ulcers noted with associated edema and stenosis. Path report negative for H. Pylori.  Hemodynamically stable. -Switch Pantoprazole 40mg  IV to po bid  x 8 weeks then every day indefinitely  -Avoid NSAIDs  -Ok to discharge home from GI perspective  -Our office will contact patient to facilitate GI follow  up   Acute anemia secondary to GI bleed -See plan above    CAD    CKD   History of lung cancer status post lobectomy. CT showed interstitial thickening and peribronchial nodularity in the upper lobes suggesting chronic pulmonary infection and similar findings in the right lower lobe, nodules measuring up to 12 mm in the right lower lobe.    Legal blindness, patient is having current issues with her left eye and had a mesh  placed by her ophthalmologist last week   DM type II       LOS: 3 days   Danielle Newman  07/19/2023, 8:01AM   GI attending - agree w/ APP note. Patient has f/u me 08/12/23.  Iva Boop, MD, [facility] See Loretha Stapler on call - gastroenterology for best contact person 07/19/2023 8:24 AM

## 2023-07-19 NOTE — TOC Transition Note (Signed)
Transition of Care [facility]) - Discharge Note  Patient Details  Name: Danielle Newman MRN: [id number] Date of Birth: [birth year]  Transition of Care [facility]) CM/SW Contact:  Ewing Schlein, LCSW Phone Number: 07/19/2023, 10:47 AM  Clinical Narrative: Patient will need a rolling walker for home use. Daughter agreeable to DME referral to [employer]. CSW made rolling walker referral to [facility] with [employer], which was accepted. [employer] to deliver rolling to patient's room. TOC signing off.  Final next level of care: Home w Home Health Services Barriers to Discharge: Barriers Resolved  Patient Goals and CMS Choice Patient states their goals for this hospitalization and ongoing recovery are:: Discharge home with [facility] Choice offered to / list presented to : Adult Children  ownership interest in [facility].provided to:: Adult Children   Discharge Plan and Services Additional resources added to the After Visit Summary for   In-house Referral: Clinical Social Work Post Acute Care Choice: Home Health          DME Arranged: Dan Humphreys rolling DME Agency: [employer] Date DME Agency Contacted: 07/19/23 Time DME Agency Contacted: [id number] Representative spoke with at DME Agency: Zack HH Arranged: PT, OT HH Agency: [facility] Date [facility] Agency Contacted: 07/18/23 Time HH Agency Contacted: [phone] Representative spoke with at [facility] Agency: Haywood Lasso  Social Drivers of Health (SDOH) Interventions SDOH Screenings   Food Insecurity: No Food Insecurity (07/15/2023)  Housing: Low Risk  (07/15/2023)  Transportation Needs: No Transportation Needs (07/15/2023)  Recent Concern: Transportation Needs - Unmet Transportation Needs (07/01/2023)  Utilities: Not At Risk (07/15/2023)  Depression (PHQ2-9): Low Risk  (07/01/2023)  Financial Resource Strain: Low Risk  (07/01/2023)  Physical Activity: Unknown (07/01/2023)  Social Connections: Socially Isolated (07/01/2023)  Stress: No Stress Concern Present (07/01/2023)   Tobacco Use: Medium Risk (07/16/2023)   Readmission Risk Interventions     No data to display

## 2023-07-19 NOTE — Progress Notes (Signed)
Physical Therapy Treatment Patient Details Name: Danielle Newman MRN: [id number] DOB: 1931/08/14 Today's Date: 07/19/2023   History of Present Illness 89 yo female admitted with GI hemorrhage with melena, anemia, weakness. 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  Pt up to ambulate increased distance in hall and negotiated stairs with rail and cane.  Dtr present for session and pt states feels more comfortable with dc today after accomplishing stairs.    If plan is discharge home, recommend the following: A little help with walking and/or transfers;A little help with bathing/dressing/bathroom;Assistance with cooking/housework;Assist for transportation;Help with stairs or ramp for entrance   Can travel by private vehicle        Equipment Recommendations  None recommended by PT    Recommendations for Other Services       Precautions / Restrictions Precautions Precautions: Fall Precaution Comments: monitor HR Restrictions Weight Bearing Restrictions Per Provider Order: No Other Position/Activity Restrictions: R eye blindness and decreased vision in L eye     Mobility  Bed Mobility               General bed mobility comments: Up in chair and requests back to same    Transfers Overall transfer level: Needs assistance Equipment used: Rolling walker (2 wheels) Transfers: Sit to/from Stand Sit to Stand: Contact guard assist, Supervision           General transfer comment: cues for transition position and use of UEs to self assist    Ambulation/Gait Ambulation/Gait assistance: Min assist, Contact guard assist Gait Distance (Feet): 40 Feet (and 30') Assistive device: Rolling walker (2 wheels) Gait Pattern/deviations: Step-to pattern, Step-through pattern, Decreased step length - right, Decreased step length - left, Shuffle, Trunk flexed Gait velocity: decr      General Gait Details: Steady assist and to direct RW 2* visual deficits.   Stairs Stairs: Yes Stairs assistance: Min assist Stair Management: One rail Right, Step to pattern, Forwards, With cane Number of Stairs: 3 General stair comments: cues for sequence and cane placement   Wheelchair Mobility     Tilt Bed    Modified Rankin (Stroke Patients Only)       Balance Overall balance assessment: Needs assistance Sitting-balance support: No upper extremity supported, Feet supported Sitting balance-Leahy Scale: Good     Standing balance support: Bilateral upper extremity supported, Reliant on assistive device for balance, During functional activity Standing balance-Leahy Scale: Poor Standing balance comment: CGA to min assist with RW                            Cognition Arousal: Alert Behavior During Therapy: WFL for tasks assessed/performed Overall Cognitive Status: Within Functional Limits for tasks assessed                                          Exercises      General Comments        Pertinent Vitals/Pain Pain Assessment Pain Assessment: No/denies pain Pain Intervention(s): Limited activity within patient's tolerance, Monitored during session    Home Living                          Prior Function  PT Goals (current goals can now be found in the care plan section) Acute Rehab PT Goals Patient Stated Goal: to get better. for pt to be able to walk household distances. PT Goal Formulation: With patient/family Time For Goal Achievement: 07/31/23 Potential to Achieve Goals: Good Progress towards PT goals: Progressing toward goals    Frequency    Min 1X/week      PT Plan      Co-evaluation              AM-PAC PT "6 Clicks" Mobility   Outcome Measure  Help needed turning from your back to your side while in a flat bed without using bedrails?: A Little Help needed moving from lying on your  back to sitting on the side of a flat bed without using bedrails?: A Little Help needed moving to and from a bed to a chair (including a wheelchair)?: A Little Help needed standing up from a chair using your arms (e.g., wheelchair or bedside chair)?: A Little Help needed to walk in hospital room?: A Little Help needed climbing 3-5 steps with a railing? : A Little 6 Click Score: 18    End of Session Equipment Utilized During Treatment: Gait belt Activity Tolerance: Patient tolerated treatment well Patient left: in chair;with call Verma/phone within reach;with family/visitor present Nurse Communication: Mobility status PT Visit Diagnosis: Muscle weakness (generalized) (M62.81);Difficulty in walking, not elsewhere classified (R26.2)     Time: 9563-8756 PT Time Calculation (min) (ACUTE ONLY): 19 min  Charges:    $Gait Training: 8-22 mins PT General Charges $$ ACUTE PT VISIT: 1 Visit                     Mauro Kaufmann PT Acute Rehabilitation Services Pager [phone] Office [phone]    [facility] 07/19/2023, 3:38 PM

## 2023-07-20 DIAGNOSIS — H269 Unspecified cataract: Secondary | ICD-10-CM | POA: Diagnosis not present

## 2023-07-20 DIAGNOSIS — M4800 Spinal stenosis, site unspecified: Secondary | ICD-10-CM | POA: Diagnosis not present

## 2023-07-20 DIAGNOSIS — E785 Hyperlipidemia, unspecified: Secondary | ICD-10-CM | POA: Diagnosis not present

## 2023-07-20 DIAGNOSIS — M8580 Other specified disorders of bone density and structure, unspecified site: Secondary | ICD-10-CM | POA: Diagnosis not present

## 2023-07-20 DIAGNOSIS — G8929 Other chronic pain: Secondary | ICD-10-CM | POA: Diagnosis not present

## 2023-07-20 DIAGNOSIS — E039 Hypothyroidism, unspecified: Secondary | ICD-10-CM | POA: Diagnosis not present

## 2023-07-20 DIAGNOSIS — I700 Atherosclerosis of aorta: Secondary | ICD-10-CM | POA: Diagnosis not present

## 2023-07-20 DIAGNOSIS — M1990 Unspecified osteoarthritis, unspecified site: Secondary | ICD-10-CM | POA: Diagnosis not present

## 2023-07-20 DIAGNOSIS — I2510 Atherosclerotic heart disease of native coronary artery without angina pectoris: Secondary | ICD-10-CM | POA: Diagnosis not present

## 2023-07-20 DIAGNOSIS — D869 Sarcoidosis, unspecified: Secondary | ICD-10-CM | POA: Diagnosis not present

## 2023-07-20 DIAGNOSIS — M545 Low back pain, unspecified: Secondary | ICD-10-CM | POA: Diagnosis not present

## 2023-07-20 DIAGNOSIS — H3530 Unspecified macular degeneration: Secondary | ICD-10-CM | POA: Diagnosis not present

## 2023-07-20 DIAGNOSIS — H3093 Unspecified chorioretinal inflammation, bilateral: Secondary | ICD-10-CM | POA: Diagnosis not present

## 2023-07-20 DIAGNOSIS — H53452 Other localized visual field defect, left eye: Secondary | ICD-10-CM | POA: Diagnosis not present

## 2023-07-20 DIAGNOSIS — N183 Chronic kidney disease, stage 3 unspecified: Secondary | ICD-10-CM | POA: Diagnosis not present

## 2023-07-20 DIAGNOSIS — J309 Allergic rhinitis, unspecified: Secondary | ICD-10-CM | POA: Diagnosis not present

## 2023-07-20 DIAGNOSIS — H5461 Unqualified visual loss, right eye, normal vision left eye: Secondary | ICD-10-CM | POA: Diagnosis not present

## 2023-07-20 DIAGNOSIS — K219 Gastro-esophageal reflux disease without esophagitis: Secondary | ICD-10-CM | POA: Diagnosis not present

## 2023-07-20 DIAGNOSIS — D62 Acute posthemorrhagic anemia: Secondary | ICD-10-CM | POA: Diagnosis not present

## 2023-07-20 DIAGNOSIS — H409 Unspecified glaucoma: Secondary | ICD-10-CM | POA: Diagnosis not present

## 2023-07-20 DIAGNOSIS — H9193 Unspecified hearing loss, bilateral: Secondary | ICD-10-CM | POA: Diagnosis not present

## 2023-07-20 DIAGNOSIS — I129 Hypertensive chronic kidney disease with stage 1 through stage 4 chronic kidney disease, or unspecified chronic kidney disease: Secondary | ICD-10-CM | POA: Diagnosis not present

## 2023-07-20 DIAGNOSIS — E1122 Type 2 diabetes mellitus with diabetic chronic kidney disease: Secondary | ICD-10-CM | POA: Diagnosis not present

## 2023-07-21 DIAGNOSIS — E1122 Type 2 diabetes mellitus with diabetic chronic kidney disease: Secondary | ICD-10-CM | POA: Diagnosis not present

## 2023-07-21 DIAGNOSIS — I129 Hypertensive chronic kidney disease with stage 1 through stage 4 chronic kidney disease, or unspecified chronic kidney disease: Secondary | ICD-10-CM | POA: Diagnosis not present

## 2023-07-21 DIAGNOSIS — M1990 Unspecified osteoarthritis, unspecified site: Secondary | ICD-10-CM | POA: Diagnosis not present

## 2023-07-21 DIAGNOSIS — D62 Acute posthemorrhagic anemia: Secondary | ICD-10-CM | POA: Diagnosis not present

## 2023-07-21 DIAGNOSIS — N183 Chronic kidney disease, stage 3 unspecified: Secondary | ICD-10-CM | POA: Diagnosis not present

## 2023-07-21 DIAGNOSIS — H3093 Unspecified chorioretinal inflammation, bilateral: Secondary | ICD-10-CM | POA: Diagnosis not present

## 2023-07-21 DIAGNOSIS — H409 Unspecified glaucoma: Secondary | ICD-10-CM | POA: Diagnosis not present

## 2023-07-21 DIAGNOSIS — H9193 Unspecified hearing loss, bilateral: Secondary | ICD-10-CM | POA: Diagnosis not present

## 2023-07-21 DIAGNOSIS — H5461 Unqualified visual loss, right eye, normal vision left eye: Secondary | ICD-10-CM | POA: Diagnosis not present

## 2023-07-21 DIAGNOSIS — H53452 Other localized visual field defect, left eye: Secondary | ICD-10-CM | POA: Diagnosis not present

## 2023-07-21 DIAGNOSIS — J309 Allergic rhinitis, unspecified: Secondary | ICD-10-CM | POA: Diagnosis not present

## 2023-07-21 DIAGNOSIS — E785 Hyperlipidemia, unspecified: Secondary | ICD-10-CM | POA: Diagnosis not present

## 2023-07-21 DIAGNOSIS — H3530 Unspecified macular degeneration: Secondary | ICD-10-CM | POA: Diagnosis not present

## 2023-07-21 DIAGNOSIS — G8929 Other chronic pain: Secondary | ICD-10-CM | POA: Diagnosis not present

## 2023-07-21 DIAGNOSIS — E039 Hypothyroidism, unspecified: Secondary | ICD-10-CM | POA: Diagnosis not present

## 2023-07-21 DIAGNOSIS — M8580 Other specified disorders of bone density and structure, unspecified site: Secondary | ICD-10-CM | POA: Diagnosis not present

## 2023-07-21 DIAGNOSIS — I700 Atherosclerosis of aorta: Secondary | ICD-10-CM | POA: Diagnosis not present

## 2023-07-21 DIAGNOSIS — H269 Unspecified cataract: Secondary | ICD-10-CM | POA: Diagnosis not present

## 2023-07-21 DIAGNOSIS — K219 Gastro-esophageal reflux disease without esophagitis: Secondary | ICD-10-CM | POA: Diagnosis not present

## 2023-07-21 DIAGNOSIS — M545 Low back pain, unspecified: Secondary | ICD-10-CM | POA: Diagnosis not present

## 2023-07-21 DIAGNOSIS — D869 Sarcoidosis, unspecified: Secondary | ICD-10-CM | POA: Diagnosis not present

## 2023-07-21 DIAGNOSIS — I2510 Atherosclerotic heart disease of native coronary artery without angina pectoris: Secondary | ICD-10-CM | POA: Diagnosis not present

## 2023-07-21 DIAGNOSIS — M4800 Spinal stenosis, site unspecified: Secondary | ICD-10-CM | POA: Diagnosis not present

## 2023-07-22 ENCOUNTER — Telehealth: Payer: Self-pay | Admitting: *Deleted

## 2023-07-22 ENCOUNTER — Telehealth: Payer: Self-pay

## 2023-07-22 NOTE — Telephone Encounter (Signed)
Sorry, I dont see a question or request

## 2023-07-22 NOTE — Transitions of Care (Post Inpatient/ED Visit) (Signed)
   07/22/2023  Name: Danielle Newman MRN: [id number] DOB: 01-02-1932  Today's TOC FU Call Status: Today's TOC FU Call Status:: Unsuccessful Call (1st Attempt) Unsuccessful Call (1st Attempt) Date: 07/22/23  Attempted to reach the patient regarding the most recent Inpatient visit; phone rang multiple times without physical or voice mail pick up; unable to leave voice message requesting call back   Follow Up Plan: Additional outreach attempts will be made to reach the patient to complete the Transitions of Care (Post Inpatient visit) call.   Caryl Pina, RN, BSN, Media planner  Transitions of Care  [facility] - [facility] [phone]: direct office

## 2023-07-23 ENCOUNTER — Telehealth: Payer: Self-pay | Admitting: *Deleted

## 2023-07-23 NOTE — Transitions of Care (Post Inpatient/ED Visit) (Signed)
07/23/2023  Name: Danielle Newman MRN: [id number] DOB: [birth year]  Today's TOC FU Call Status: Today's TOC FU Call Status:: Successful TOC FU Call Completed TOC FU Call Complete Date: 07/23/23 Patient's Name and Date of Birth confirmed.  Transition Care Management Follow-up Telephone Call Date of Discharge: 07/19/23 Discharge Facility: [facility]) Type of Discharge: Inpatient Admission Primary Inpatient Discharge Diagnosis:: GI Bleeding with melena How have you been since you were released from the hospital?: Better ("I am fine; I am independent; I live with my daughter and she and my other family members are keeping a close eye open on me, but I really don't need any help.  I am using the new walker.  So far, my poop looks normal with no signs of blood in it") Any questions or concerns?: No  Items Reviewed: Did you receive and understand the discharge instructions provided?: Yes (thoroughly reviewed with patient who verbalizes good understanding of same) Medications obtained,verified, and reconciled?: Yes (Medications Reviewed) (Full medication reconciliation/ review completed; no concerns or discrepancies identified; confirmed patient obtained/ is taking all newly Rx'd medications as instructed; self-manages medications and denies questions/ concerns around medications today) Any new allergies since your discharge?: No Dietary orders reviewed?: Yes Type of Diet Ordered:: "As healthy as I can" Do you have support at home?: Yes People in Home: child(ren), adult Name of Support/Comfort Primary Source: Reports independent in self-care activities; resides with supportive daughter; she and other extended local family members assists as/ if needed/ indicated  Medications Reviewed Today: Medications Reviewed Today     Reviewed by Michaela Corner, RN (Registered Nurse) on 07/23/23 at 1226  Med List Status: <None>   Medication Order Taking? Sig Documenting Provider Last Dose Status  Informant  Acidophilus Lactobacillus CAPS [id number] Yes Take 1 capsule by mouth daily. [provider] Taking Active Multiple Informants  albuterol (VENTOLIN HFA) 108 (90 Base) MCG/ACT inhaler [id number] Yes Inhale 2 puffs into the lungs every 6 (six) hours as needed for wheezing or shortness of breath. Corwin Levins, MD Taking Active Multiple Informants  AMBULATORY NON FORMULARY MEDICATION [id number] Yes Custom shoe lifts to treat acquired leg length discrepancy. [employer]   Fax # [phone] Rodolph Bong, MD Taking Active   aspirin EC 81 MG tablet [id number] No Take 81 mg by mouth daily. Swallow whole.  Patient not taking: Reported on 07/23/2023   [provider] Not Taking Active Multiple Informants           Med Note Michaela Corner   Tue Jul 23, 2023 12:26 PM) 07/23/23: TOC call-- patient able to independently verbalize without prompting that she is currently holding until 08/02/23- as per hospital discharge instructions  Cholecalciferol (VITAMIN D3) 50 MCG (2000 UT) TABS [id number] Yes Take 2,000 Units by mouth daily. [provider] Taking Active Multiple Informants  ciclopirox (PENLAC) 8 % solution [id number] Yes Apply topically at bedtime. Apply over nail and surrounding skin. Apply daily over previous coat. After seven (7) days, may remove with alcohol and continue cycle. Vivi Barrack, DPM Taking Active Multiple Informants  cyclobenzaprine (FLEXERIL) 5 MG tablet [id number] No Take 1 tablet (5 mg total) by mouth 3 (three) times daily as needed for muscle spasms.  Patient not taking: Reported on 07/23/2023   Corwin Levins, MD Not Taking Active Multiple Informants  levothyroxine (SYNTHROID) 100 MCG tablet [id number] Yes TAKE 1 TABLET BY MOUTH DAILY  Patient taking differently: Take 100 mcg by mouth daily before breakfast.  Corwin Levins, MD Taking Active Multiple Informants  lovastatin (MEVACOR) 40 MG tablet [id number] Yes TAKE 1 TABLET BY MOUTH DAILY Corwin Levins, MD Taking Active Multiple Informants  moxifloxacin (VIGAMOX) 0.5 % ophthalmic solution [id number] Yes Place 1 drop into the left eye See admin instructions. Instill 1 drop into the left eye four times a day, alternating with generic Maxitrol- until the next office visit Administration times: 0800/1200/1600/2000 [provider] Taking Active Multiple Informants  neomycin-polymyxin b-dexamethasone (MAXITROL) 3.5-10000-0.1 SUSP [id number] Yes Place 1 drop into the left eye See admin instructions. Instill 1 drop into the left eye four times a day, alternating with generic Vigamox- until the next office visit Administration Times: 0600/1000/1400/1800 [provider] Taking Active Multiple Informants  pantoprazole (PROTONIX) 40 MG tablet [id number] Yes Take 1 tablet (40 mg total) by mouth daily. Take Protonix 40 mg po twice daily for 2 weeks , then take Protonix 40 mg po daily Meredeth Ide, MD Taking Active   REFRESH TEARS PF 0.5-0.9 % SOLN [id number] Yes Place 1 drop into both eyes See admin instructions. Instill 1 drop into affected eye(s) up to six times a day as needed for matted appearance [provider] Taking Active Multiple Informants  timolol (TIMOPTIC) 0.5 % ophthalmic solution [id number] Yes INSTILL ONE DROP INTO EACH EYE TWO TIMES A DAY  Patient taking differently: Place 1 drop into both eyes in the morning and at bedtime.   Edmon Crape, MD Taking Active Multiple Informants           Med Note Sharlynn Oliphant Jul 15, 2023  6:52 PM) Cannot be used within 15-30 minutes of other eyedrops  triamcinolone (NASACORT) 55 MCG/ACT AERO nasal inhaler [id number] No Place 2 sprays into the nose daily.  Patient not taking: Reported on 07/15/2023   Corwin Levins, MD Not Taking Active Multiple Informants           Home Care and Equipment/Supplies: Were Home Health Services Ordered?: Yes Name of Home Health Agency:: [facility]- PT Has Agency set up a time to come to  your home?: Yes First Home Health Visit Date: 07/20/23 Any new equipment or medical supplies ordered?: Yes (Rolling Walker) Name of Medical supply agency?: [employer] Were you able to get the equipment/medical supplies?: Yes Do you have any questions related to the use of the equipment/supplies?: No  Functional Questionnaire: Do you need assistance with bathing/showering or dressing?: No Do you need assistance with meal preparation?: No Do you need assistance with eating?: No Do you have difficulty maintaining continence: No Do you need assistance with getting out of bed/getting out of a chair/moving?: No Do you have difficulty managing or taking your medications?: No  Follow up appointments reviewed: PCP Follow-up appointment confirmed?: No (patient declines assistance with scheduling HFU OV with PCP- states her daughter will make the appointment when she returns home from traveling) MD Provider Line [phone] Given: No (verified well-established with current PCP) Specialist Hospital Follow-up appointment confirmed?: Yes Date of Specialist follow-up appointment?: 08/11/22 (verified this is recommended time frame for follow up per hospital discharging provider notes) Follow-Up Specialty Provider:: GI provider Do you need transportation to your follow-up appointment?: No Do you understand care options if your condition(s) worsen?: Yes-patient verbalized understanding  SDOH Interventions Today    Flowsheet Row Most Recent Value  SDOH Interventions   Food Insecurity Interventions Intervention Not Indicated  Housing Interventions Intervention Not Indicated  [[facility]- resides with daughter]  Transportation Interventions Intervention  Not Indicated  [daughter provides all transportation]  Utilities Interventions Intervention Not Indicated      Interventions Today    Flowsheet Row Most Recent Value  Chronic Disease   Chronic disease during today's visit Other  [GI bleeding with   melena]  General Interventions   General Interventions Discussed/Reviewed General Interventions Discussed, Durable Medical Equipment (DME)  Durable Medical Equipment (DME) Val Riles currently requiring/ using assistive devices for ambulation -- new rolling walker]  Exercise Interventions   Exercise Discussed/Reviewed Exercise Discussed  [role of home health services with importance of participation/ ongoing engagement]  Education Interventions   Education Provided Provided Education  Provided Verbal Education On Other  [signs/ symptoms GI bleeding with corresponding action plan]  Nutrition Interventions   Nutrition Discussed/Reviewed Nutrition Discussed  Pharmacy Interventions   Pharmacy Dicussed/Reviewed Pharmacy Topics Discussed  [Full medication review with updating medication list in EHR per patient report]  Safety Interventions   Safety Discussed/Reviewed Safety Discussed       TOC Interventions Today    Flowsheet Row Most Recent Value  TOC Interventions   TOC Interventions Discussed/Reviewed TOC Interventions Discussed      Patient declines need for ongoing/ further care management outreach; no needs identified at time of TOC call today- declines enrollment in 30-day TOC program; provided my direct contact information should questions/ concerns/ needs arise post-TOC call   Caryl Pina, RN, BSN, CCRN Alumnus RN Care Manager  Transitions of Care  [facility] - [facility] [phone]: direct office

## 2023-08-01 DIAGNOSIS — D62 Acute posthemorrhagic anemia: Secondary | ICD-10-CM | POA: Diagnosis not present

## 2023-08-01 DIAGNOSIS — K219 Gastro-esophageal reflux disease without esophagitis: Secondary | ICD-10-CM | POA: Diagnosis not present

## 2023-08-01 DIAGNOSIS — N183 Chronic kidney disease, stage 3 unspecified: Secondary | ICD-10-CM | POA: Diagnosis not present

## 2023-08-01 DIAGNOSIS — M1990 Unspecified osteoarthritis, unspecified site: Secondary | ICD-10-CM | POA: Diagnosis not present

## 2023-08-01 DIAGNOSIS — I700 Atherosclerosis of aorta: Secondary | ICD-10-CM | POA: Diagnosis not present

## 2023-08-01 DIAGNOSIS — H3530 Unspecified macular degeneration: Secondary | ICD-10-CM | POA: Diagnosis not present

## 2023-08-01 DIAGNOSIS — M8580 Other specified disorders of bone density and structure, unspecified site: Secondary | ICD-10-CM | POA: Diagnosis not present

## 2023-08-01 DIAGNOSIS — M545 Low back pain, unspecified: Secondary | ICD-10-CM | POA: Diagnosis not present

## 2023-08-01 DIAGNOSIS — E039 Hypothyroidism, unspecified: Secondary | ICD-10-CM | POA: Diagnosis not present

## 2023-08-01 DIAGNOSIS — J309 Allergic rhinitis, unspecified: Secondary | ICD-10-CM | POA: Diagnosis not present

## 2023-08-01 DIAGNOSIS — E785 Hyperlipidemia, unspecified: Secondary | ICD-10-CM | POA: Diagnosis not present

## 2023-08-01 DIAGNOSIS — E1122 Type 2 diabetes mellitus with diabetic chronic kidney disease: Secondary | ICD-10-CM | POA: Diagnosis not present

## 2023-08-01 DIAGNOSIS — M4800 Spinal stenosis, site unspecified: Secondary | ICD-10-CM | POA: Diagnosis not present

## 2023-08-01 DIAGNOSIS — I2510 Atherosclerotic heart disease of native coronary artery without angina pectoris: Secondary | ICD-10-CM | POA: Diagnosis not present

## 2023-08-01 DIAGNOSIS — I129 Hypertensive chronic kidney disease with stage 1 through stage 4 chronic kidney disease, or unspecified chronic kidney disease: Secondary | ICD-10-CM | POA: Diagnosis not present

## 2023-08-01 DIAGNOSIS — H5461 Unqualified visual loss, right eye, normal vision left eye: Secondary | ICD-10-CM | POA: Diagnosis not present

## 2023-08-01 DIAGNOSIS — G8929 Other chronic pain: Secondary | ICD-10-CM | POA: Diagnosis not present

## 2023-08-01 DIAGNOSIS — H53452 Other localized visual field defect, left eye: Secondary | ICD-10-CM | POA: Diagnosis not present

## 2023-08-01 DIAGNOSIS — H3093 Unspecified chorioretinal inflammation, bilateral: Secondary | ICD-10-CM | POA: Diagnosis not present

## 2023-08-01 DIAGNOSIS — D869 Sarcoidosis, unspecified: Secondary | ICD-10-CM | POA: Diagnosis not present

## 2023-08-01 DIAGNOSIS — H269 Unspecified cataract: Secondary | ICD-10-CM | POA: Diagnosis not present

## 2023-08-01 DIAGNOSIS — H5711 Ocular pain, right eye: Secondary | ICD-10-CM | POA: Diagnosis not present

## 2023-08-01 DIAGNOSIS — H9193 Unspecified hearing loss, bilateral: Secondary | ICD-10-CM | POA: Diagnosis not present

## 2023-08-01 DIAGNOSIS — S0502XA Injury of conjunctiva and corneal abrasion without foreign body, left eye, initial encounter: Secondary | ICD-10-CM | POA: Diagnosis not present

## 2023-08-01 DIAGNOSIS — H409 Unspecified glaucoma: Secondary | ICD-10-CM | POA: Diagnosis not present

## 2023-08-08 DIAGNOSIS — E1122 Type 2 diabetes mellitus with diabetic chronic kidney disease: Secondary | ICD-10-CM | POA: Diagnosis not present

## 2023-08-08 DIAGNOSIS — H9193 Unspecified hearing loss, bilateral: Secondary | ICD-10-CM | POA: Diagnosis not present

## 2023-08-08 DIAGNOSIS — H53452 Other localized visual field defect, left eye: Secondary | ICD-10-CM | POA: Diagnosis not present

## 2023-08-08 DIAGNOSIS — M8580 Other specified disorders of bone density and structure, unspecified site: Secondary | ICD-10-CM | POA: Diagnosis not present

## 2023-08-08 DIAGNOSIS — H269 Unspecified cataract: Secondary | ICD-10-CM | POA: Diagnosis not present

## 2023-08-08 DIAGNOSIS — I129 Hypertensive chronic kidney disease with stage 1 through stage 4 chronic kidney disease, or unspecified chronic kidney disease: Secondary | ICD-10-CM | POA: Diagnosis not present

## 2023-08-08 DIAGNOSIS — I2510 Atherosclerotic heart disease of native coronary artery without angina pectoris: Secondary | ICD-10-CM | POA: Diagnosis not present

## 2023-08-08 DIAGNOSIS — M545 Low back pain, unspecified: Secondary | ICD-10-CM | POA: Diagnosis not present

## 2023-08-08 DIAGNOSIS — K219 Gastro-esophageal reflux disease without esophagitis: Secondary | ICD-10-CM | POA: Diagnosis not present

## 2023-08-08 DIAGNOSIS — H409 Unspecified glaucoma: Secondary | ICD-10-CM | POA: Diagnosis not present

## 2023-08-08 DIAGNOSIS — N183 Chronic kidney disease, stage 3 unspecified: Secondary | ICD-10-CM | POA: Diagnosis not present

## 2023-08-08 DIAGNOSIS — E785 Hyperlipidemia, unspecified: Secondary | ICD-10-CM | POA: Diagnosis not present

## 2023-08-08 DIAGNOSIS — H5461 Unqualified visual loss, right eye, normal vision left eye: Secondary | ICD-10-CM | POA: Diagnosis not present

## 2023-08-08 DIAGNOSIS — H3093 Unspecified chorioretinal inflammation, bilateral: Secondary | ICD-10-CM | POA: Diagnosis not present

## 2023-08-08 DIAGNOSIS — M4800 Spinal stenosis, site unspecified: Secondary | ICD-10-CM | POA: Diagnosis not present

## 2023-08-08 DIAGNOSIS — I700 Atherosclerosis of aorta: Secondary | ICD-10-CM | POA: Diagnosis not present

## 2023-08-08 DIAGNOSIS — J309 Allergic rhinitis, unspecified: Secondary | ICD-10-CM | POA: Diagnosis not present

## 2023-08-08 DIAGNOSIS — D869 Sarcoidosis, unspecified: Secondary | ICD-10-CM | POA: Diagnosis not present

## 2023-08-08 DIAGNOSIS — H3530 Unspecified macular degeneration: Secondary | ICD-10-CM | POA: Diagnosis not present

## 2023-08-08 DIAGNOSIS — G8929 Other chronic pain: Secondary | ICD-10-CM | POA: Diagnosis not present

## 2023-08-08 DIAGNOSIS — M1990 Unspecified osteoarthritis, unspecified site: Secondary | ICD-10-CM | POA: Diagnosis not present

## 2023-08-08 DIAGNOSIS — D62 Acute posthemorrhagic anemia: Secondary | ICD-10-CM | POA: Diagnosis not present

## 2023-08-08 DIAGNOSIS — E039 Hypothyroidism, unspecified: Secondary | ICD-10-CM | POA: Diagnosis not present

## 2023-08-09 DIAGNOSIS — E039 Hypothyroidism, unspecified: Secondary | ICD-10-CM | POA: Diagnosis not present

## 2023-08-09 DIAGNOSIS — K219 Gastro-esophageal reflux disease without esophagitis: Secondary | ICD-10-CM | POA: Diagnosis not present

## 2023-08-09 DIAGNOSIS — G8929 Other chronic pain: Secondary | ICD-10-CM | POA: Diagnosis not present

## 2023-08-09 DIAGNOSIS — N183 Chronic kidney disease, stage 3 unspecified: Secondary | ICD-10-CM | POA: Diagnosis not present

## 2023-08-09 DIAGNOSIS — H5461 Unqualified visual loss, right eye, normal vision left eye: Secondary | ICD-10-CM | POA: Diagnosis not present

## 2023-08-09 DIAGNOSIS — H269 Unspecified cataract: Secondary | ICD-10-CM | POA: Diagnosis not present

## 2023-08-09 DIAGNOSIS — J309 Allergic rhinitis, unspecified: Secondary | ICD-10-CM | POA: Diagnosis not present

## 2023-08-09 DIAGNOSIS — M8580 Other specified disorders of bone density and structure, unspecified site: Secondary | ICD-10-CM | POA: Diagnosis not present

## 2023-08-09 DIAGNOSIS — H53452 Other localized visual field defect, left eye: Secondary | ICD-10-CM | POA: Diagnosis not present

## 2023-08-09 DIAGNOSIS — M545 Low back pain, unspecified: Secondary | ICD-10-CM | POA: Diagnosis not present

## 2023-08-09 DIAGNOSIS — H3093 Unspecified chorioretinal inflammation, bilateral: Secondary | ICD-10-CM | POA: Diagnosis not present

## 2023-08-09 DIAGNOSIS — I129 Hypertensive chronic kidney disease with stage 1 through stage 4 chronic kidney disease, or unspecified chronic kidney disease: Secondary | ICD-10-CM | POA: Diagnosis not present

## 2023-08-09 DIAGNOSIS — M1990 Unspecified osteoarthritis, unspecified site: Secondary | ICD-10-CM | POA: Diagnosis not present

## 2023-08-09 DIAGNOSIS — D62 Acute posthemorrhagic anemia: Secondary | ICD-10-CM | POA: Diagnosis not present

## 2023-08-09 DIAGNOSIS — H9193 Unspecified hearing loss, bilateral: Secondary | ICD-10-CM | POA: Diagnosis not present

## 2023-08-09 DIAGNOSIS — H409 Unspecified glaucoma: Secondary | ICD-10-CM | POA: Diagnosis not present

## 2023-08-09 DIAGNOSIS — M4800 Spinal stenosis, site unspecified: Secondary | ICD-10-CM | POA: Diagnosis not present

## 2023-08-09 DIAGNOSIS — H3530 Unspecified macular degeneration: Secondary | ICD-10-CM | POA: Diagnosis not present

## 2023-08-09 DIAGNOSIS — D869 Sarcoidosis, unspecified: Secondary | ICD-10-CM | POA: Diagnosis not present

## 2023-08-09 DIAGNOSIS — I700 Atherosclerosis of aorta: Secondary | ICD-10-CM | POA: Diagnosis not present

## 2023-08-09 DIAGNOSIS — I2510 Atherosclerotic heart disease of native coronary artery without angina pectoris: Secondary | ICD-10-CM | POA: Diagnosis not present

## 2023-08-09 DIAGNOSIS — E1122 Type 2 diabetes mellitus with diabetic chronic kidney disease: Secondary | ICD-10-CM | POA: Diagnosis not present

## 2023-08-09 DIAGNOSIS — E785 Hyperlipidemia, unspecified: Secondary | ICD-10-CM | POA: Diagnosis not present

## 2023-08-12 ENCOUNTER — Other Ambulatory Visit (INDEPENDENT_AMBULATORY_CARE_PROVIDER_SITE_OTHER): Payer: Medicare Other

## 2023-08-12 ENCOUNTER — Ambulatory Visit: Payer: Medicare Other | Admitting: Internal Medicine

## 2023-08-12 ENCOUNTER — Encounter: Payer: Self-pay | Admitting: Internal Medicine

## 2023-08-12 VITALS — BP 104/48 | HR 80 | Ht 67.0 in | Wt 144.0 lb

## 2023-08-12 DIAGNOSIS — K219 Gastro-esophageal reflux disease without esophagitis: Secondary | ICD-10-CM | POA: Diagnosis not present

## 2023-08-12 DIAGNOSIS — S0502XA Injury of conjunctiva and corneal abrasion without foreign body, left eye, initial encounter: Secondary | ICD-10-CM | POA: Diagnosis not present

## 2023-08-12 DIAGNOSIS — K222 Esophageal obstruction: Secondary | ICD-10-CM | POA: Diagnosis not present

## 2023-08-12 DIAGNOSIS — D62 Acute posthemorrhagic anemia: Secondary | ICD-10-CM | POA: Diagnosis not present

## 2023-08-12 DIAGNOSIS — K260 Acute duodenal ulcer with hemorrhage: Secondary | ICD-10-CM | POA: Diagnosis not present

## 2023-08-12 DIAGNOSIS — K5900 Constipation, unspecified: Secondary | ICD-10-CM

## 2023-08-12 DIAGNOSIS — H5712 Ocular pain, left eye: Secondary | ICD-10-CM | POA: Diagnosis not present

## 2023-08-12 LAB — CBC WITH DIFFERENTIAL/PLATELET
Basophils Absolute: 0.1 10*3/uL (ref 0.0–0.1)
Basophils Relative: 0.7 % (ref 0.0–3.0)
Eosinophils Absolute: 0.1 10*3/uL (ref 0.0–0.7)
Eosinophils Relative: 1 % (ref 0.0–5.0)
HCT: 33 % — ABNORMAL LOW (ref 36.0–46.0)
Hemoglobin: 10.6 g/dL — ABNORMAL LOW (ref 12.0–15.0)
Lymphocytes Relative: 18.1 % (ref 12.0–46.0)
Lymphs Abs: 1.6 10*3/uL (ref 0.7–4.0)
MCHC: 32 g/dL (ref 30.0–36.0)
MCV: 86.5 fL (ref 78.0–100.0)
Monocytes Absolute: 0.8 10*3/uL (ref 0.1–1.0)
Monocytes Relative: 9.5 % (ref 3.0–12.0)
Neutro Abs: 6.2 10*3/uL (ref 1.4–7.7)
Neutrophils Relative %: 70.7 % (ref 43.0–77.0)
Platelets: 428 10*3/uL — ABNORMAL HIGH (ref 150.0–400.0)
RBC: 3.81 Mil/uL — ABNORMAL LOW (ref 3.87–5.11)
RDW: 15.7 % — ABNORMAL HIGH (ref 11.5–15.5)
WBC: 8.8 10*3/uL (ref 4.0–10.5)

## 2023-08-12 LAB — HM DIABETES EYE EXAM

## 2023-08-12 LAB — FERRITIN: Ferritin: 11 ng/mL (ref 10.0–291.0)

## 2023-08-12 NOTE — Patient Instructions (Signed)
 Your provider has requested that you go to the basement level for lab work before leaving today. Press B on the elevator. The lab is located at the first door on the left as you exit the elevator.  Due to recent changes in healthcare laws, you may see the results of your imaging and laboratory studies on MyChart before your provider has had a chance to review them.  We understand that in some cases there may be results that are confusing or concerning to you. Not all laboratory results come back in the same time frame and the provider may be waiting for multiple results in order to interpret others.  Please give us  48 hours in order for your provider to thoroughly review all the results before contacting the office for clarification of your results.   _______________________________________________________  If your blood pressure at your visit was 140/90 or greater, please contact your primary care physician to follow up on this.  _______________________________________________________  If you are age 88 or older, your body mass index should be between 23-30. Your Body mass index is 22.55 kg/m. If this is out of the aforementioned range listed, please consider follow up with your Primary Care Provider.  If you are age 1 or younger, your body mass index should be between 19-25. Your Body mass index is 22.55 kg/m. If this is out of the aformentioned range listed, please consider follow up with your Primary Care Provider.   ________________________________________________________  The [place] [facility] providers would like to encourage you to use MYCHART to communicate with providers for non-urgent requests or questions.  Due to long hold times on the telephone, sending your provider a message by [facility] may be a faster and more efficient way to get a response.  Please allow 48 business hours for a response.  Please remember that this is for non-urgent requests.   _______________________________________________________  I appreciate the opportunity to care for you. Lupita Commander, MD, [facility]

## 2023-08-12 NOTE — Progress Notes (Signed)
 Danielle Newman 89 y.o. [birth year] [id number]  Assessment & Plan:   Encounter Diagnoses  Name Primary?   Acute duodenal ulcer with hemorrhage Yes   GERD with stricture    ABLA (acute blood loss anemia)    Constipation, unspecified constipation type    She is clinically improved.  Dysphagia resolved after esophageal dilation to 15 mm plus PPI therapy.  Now down on daily PPI therapy and may continue that.  Etiology of duodenal ulcer thought to be NSAID H. pylori was negative.  Continue PPI for this as well.  MiraLAX for constipation she may take it daily and titrate for effect  Orders Placed This Encounter  Procedures   CBC with Differential/Platelet   Ferritin   Should she need iron therapy will need to take into account the potential for constipation from that.  Acetaminophen  is safe for musculoskeletal complaints reviewed with patient and daughter.  Avoid NSAIDs.  CC: Danielle Newman ORN, MD   Subjective:   Chief Complaint: Follow-up of GERD with stricture and duodenal ulcer with hemorrhage  HPI 89 year old white woman with a history of legal blindness/low vision, chronic kidney disease, lung cancer status post lobectomy who was hospitalized in December and found to have an NSAID induced bleeding duodenal ulcer treated with Endo-therapy (epinephrine  injection therapy and BiCap) and also had a distal esophageal stricture and grade C esophagitis, with dilation via balloon to 15 mm.  She was having dysphagia previously also.  She returns for follow-up today.  No hemoglobin since discharge.  Gastric biopsies negative for H. pylori.  She is feeling stronger.  Her daughter is here with her today there is no reported melena or rectal bleeding.  Absolutely no dysphagia.  Does struggle with constipation not a new thing in the past she took MiraLAX but for some reason is not using it now.  Says she may go up to a week without significant defecation.  Lab Results  Component Value  Date   WBC 9.8 07/19/2023   HGB 9.3 (L) 07/19/2023   HCT 29.5 (L) 07/19/2023   MCV 91.3 07/19/2023   PLT 249 07/19/2023   Wt Readings from Last 3 Encounters:  08/12/23 144 lb (65.3 kg)  07/15/23 144 lb 10 oz (65.6 kg)  07/01/23 150 lb (68 kg)     Allergies  Allergen Reactions   Fioricet [Butalbital -Apap-Caffeine ] Other (See Comments)    Confusion    Meperidine Hcl Other (See Comments)    Reaction not noted   Current Meds  Medication Sig   AMBULATORY NON FORMULARY MEDICATION Custom shoe lifts to treat acquired leg length discrepancy. [facility]   Fax # [phone]   aspirin EC 81 MG tablet Take 81 mg by mouth daily. Swallow whole.   Cholecalciferol (VITAMIN D3) 50 MCG (2000 UT) TABS Take 2,000 Units by mouth daily.   levothyroxine  (SYNTHROID ) 100 MCG tablet TAKE 1 TABLET BY MOUTH DAILY (Patient taking differently: Take 100 mcg by mouth daily before breakfast.)   lovastatin  (MEVACOR ) 40 MG tablet TAKE 1 TABLET BY MOUTH DAILY   melatonin 3 MG TABS tablet Take 3 mg by mouth at bedtime.   neomycin -polymyxin b-dexamethasone  (MAXITROL ) 3.5-10000-0.1 SUSP Place 1 drop into the left eye See admin instructions. Instill 1 drop into the left eye four times a day, alternating with generic Vigamox- until the next office visit Administration Times: 0600/1000/1400/1800   pantoprazole  (PROTONIX ) 40 MG tablet Take 1 tablet (40 mg total) by mouth daily. Take Protonix  40 mg po twice daily for  2 weeks , then take Protonix  40 mg po daily   REFRESH TEARS PF 0.5-0.9 % SOLN Place 1 drop into both eyes See admin instructions. 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; since 2008   CORONARY ARTERY DISEASE 02/11/2008   DEPRESSION 02/11/2008   DM (diabetes mellitus) (HCC) 11/21/2011   DYSPNEA 09/11/2010   GERD 02/11/2008   HYPERLIPIDEMIA 02/11/2008   HYPOTHYROIDISM 02/11/2008   Impaired glucose tolerance 11/20/2011   LUNG CANCER, HX OF 02/11/2008   MACULAR DEGENERATION 03/29/2009   OSTEOPENIA 02/11/2008   Sarcoidosis 02/11/2008   Sight impaired 04/05/2011   Past Surgical History:  Procedure Laterality Date   BALLOON DILATION N/A 07/16/2023   Procedure: BALLOON DILATION;  Surgeon: Avram Lupita BRAVO, MD;  Location: [facility];  Service: Gastroenterology;  Laterality: N/A;   BIOPSY  07/16/2023   Procedure: BIOPSY;  Surgeon: Avram Lupita BRAVO, MD;  Location: [facility];  Service: Gastroenterology;;   ESOPHAGOGASTRODUODENOSCOPY (EGD) WITH PROPOFOL  N/A 07/16/2023   Procedure: ESOPHAGOGASTRODUODENOSCOPY (EGD) WITH PROPOFOL ;  Surgeon: Avram Lupita BRAVO, MD;  Location: [facility];  Service: Gastroenterology;  Laterality: N/A;   HEMOSTASIS CONTROL  07/16/2023   Procedure: HEMOSTASIS CONTROL;  Surgeon: Avram Lupita BRAVO, MD;  Location: [facility];  Service: Gastroenterology;;   HOT HEMOSTASIS N/A 07/16/2023   Procedure: HOT HEMOSTASIS (ARGON PLASMA COAGULATION/BICAP);  Surgeon: Avram Lupita BRAVO, MD;  Location: [facility];  Service: Gastroenterology;  Laterality: N/A;   s/p arm surgury  05/29/1994   s/p left upper lobectomy     SUBMUCOSAL INJECTION  07/16/2023   Procedure: SUBMUCOSAL INJECTION;  Surgeon: Avram Lupita BRAVO, MD;  Location: [facility];  Service: Gastroenterology;;   Social History   Social History Narrative   Widow   Daughter Shelba Grana is caregiver   family history includes Allergies in her daughter; Cancer in her daughter and mother.   Review of Systems Left corneal abrasions are improving still following with ophthalmology  Objective:   Physical Exam BP (!) 104/48   Pulse 80   Ht 5' 7 (1.702 m)   Wt  144 lb (65.3 kg)   SpO2 97%   BMI 22.55 kg/m  Elderly white woman no acute distress

## 2023-08-13 DIAGNOSIS — H3530 Unspecified macular degeneration: Secondary | ICD-10-CM | POA: Diagnosis not present

## 2023-08-13 DIAGNOSIS — H409 Unspecified glaucoma: Secondary | ICD-10-CM | POA: Diagnosis not present

## 2023-08-13 DIAGNOSIS — H9193 Unspecified hearing loss, bilateral: Secondary | ICD-10-CM | POA: Diagnosis not present

## 2023-08-13 DIAGNOSIS — I700 Atherosclerosis of aorta: Secondary | ICD-10-CM | POA: Diagnosis not present

## 2023-08-13 DIAGNOSIS — M1990 Unspecified osteoarthritis, unspecified site: Secondary | ICD-10-CM | POA: Diagnosis not present

## 2023-08-13 DIAGNOSIS — M4800 Spinal stenosis, site unspecified: Secondary | ICD-10-CM | POA: Diagnosis not present

## 2023-08-13 DIAGNOSIS — E039 Hypothyroidism, unspecified: Secondary | ICD-10-CM | POA: Diagnosis not present

## 2023-08-13 DIAGNOSIS — H5461 Unqualified visual loss, right eye, normal vision left eye: Secondary | ICD-10-CM | POA: Diagnosis not present

## 2023-08-13 DIAGNOSIS — N183 Chronic kidney disease, stage 3 unspecified: Secondary | ICD-10-CM | POA: Diagnosis not present

## 2023-08-13 DIAGNOSIS — H3093 Unspecified chorioretinal inflammation, bilateral: Secondary | ICD-10-CM | POA: Diagnosis not present

## 2023-08-13 DIAGNOSIS — D869 Sarcoidosis, unspecified: Secondary | ICD-10-CM | POA: Diagnosis not present

## 2023-08-13 DIAGNOSIS — M545 Low back pain, unspecified: Secondary | ICD-10-CM | POA: Diagnosis not present

## 2023-08-13 DIAGNOSIS — H269 Unspecified cataract: Secondary | ICD-10-CM | POA: Diagnosis not present

## 2023-08-13 DIAGNOSIS — D62 Acute posthemorrhagic anemia: Secondary | ICD-10-CM | POA: Diagnosis not present

## 2023-08-13 DIAGNOSIS — I2510 Atherosclerotic heart disease of native coronary artery without angina pectoris: Secondary | ICD-10-CM | POA: Diagnosis not present

## 2023-08-13 DIAGNOSIS — G8929 Other chronic pain: Secondary | ICD-10-CM | POA: Diagnosis not present

## 2023-08-13 DIAGNOSIS — H53452 Other localized visual field defect, left eye: Secondary | ICD-10-CM | POA: Diagnosis not present

## 2023-08-13 DIAGNOSIS — E1122 Type 2 diabetes mellitus with diabetic chronic kidney disease: Secondary | ICD-10-CM | POA: Diagnosis not present

## 2023-08-13 DIAGNOSIS — E785 Hyperlipidemia, unspecified: Secondary | ICD-10-CM | POA: Diagnosis not present

## 2023-08-13 DIAGNOSIS — I129 Hypertensive chronic kidney disease with stage 1 through stage 4 chronic kidney disease, or unspecified chronic kidney disease: Secondary | ICD-10-CM | POA: Diagnosis not present

## 2023-08-13 DIAGNOSIS — J309 Allergic rhinitis, unspecified: Secondary | ICD-10-CM | POA: Diagnosis not present

## 2023-08-13 DIAGNOSIS — M8580 Other specified disorders of bone density and structure, unspecified site: Secondary | ICD-10-CM | POA: Diagnosis not present

## 2023-08-13 DIAGNOSIS — K219 Gastro-esophageal reflux disease without esophagitis: Secondary | ICD-10-CM | POA: Diagnosis not present

## 2023-08-15 ENCOUNTER — Other Ambulatory Visit: Payer: Self-pay

## 2023-08-15 DIAGNOSIS — E785 Hyperlipidemia, unspecified: Secondary | ICD-10-CM | POA: Diagnosis not present

## 2023-08-15 DIAGNOSIS — D869 Sarcoidosis, unspecified: Secondary | ICD-10-CM | POA: Diagnosis not present

## 2023-08-15 DIAGNOSIS — H3530 Unspecified macular degeneration: Secondary | ICD-10-CM | POA: Diagnosis not present

## 2023-08-15 DIAGNOSIS — H9193 Unspecified hearing loss, bilateral: Secondary | ICD-10-CM | POA: Diagnosis not present

## 2023-08-15 DIAGNOSIS — H3093 Unspecified chorioretinal inflammation, bilateral: Secondary | ICD-10-CM | POA: Diagnosis not present

## 2023-08-15 DIAGNOSIS — H53452 Other localized visual field defect, left eye: Secondary | ICD-10-CM | POA: Diagnosis not present

## 2023-08-15 DIAGNOSIS — H409 Unspecified glaucoma: Secondary | ICD-10-CM | POA: Diagnosis not present

## 2023-08-15 DIAGNOSIS — M4800 Spinal stenosis, site unspecified: Secondary | ICD-10-CM | POA: Diagnosis not present

## 2023-08-15 DIAGNOSIS — I129 Hypertensive chronic kidney disease with stage 1 through stage 4 chronic kidney disease, or unspecified chronic kidney disease: Secondary | ICD-10-CM | POA: Diagnosis not present

## 2023-08-15 DIAGNOSIS — M1990 Unspecified osteoarthritis, unspecified site: Secondary | ICD-10-CM | POA: Diagnosis not present

## 2023-08-15 DIAGNOSIS — G8929 Other chronic pain: Secondary | ICD-10-CM | POA: Diagnosis not present

## 2023-08-15 DIAGNOSIS — H5461 Unqualified visual loss, right eye, normal vision left eye: Secondary | ICD-10-CM | POA: Diagnosis not present

## 2023-08-15 DIAGNOSIS — D62 Acute posthemorrhagic anemia: Secondary | ICD-10-CM | POA: Diagnosis not present

## 2023-08-15 DIAGNOSIS — J309 Allergic rhinitis, unspecified: Secondary | ICD-10-CM | POA: Diagnosis not present

## 2023-08-15 DIAGNOSIS — R790 Abnormal level of blood mineral: Secondary | ICD-10-CM

## 2023-08-15 DIAGNOSIS — M8580 Other specified disorders of bone density and structure, unspecified site: Secondary | ICD-10-CM | POA: Diagnosis not present

## 2023-08-15 DIAGNOSIS — I2510 Atherosclerotic heart disease of native coronary artery without angina pectoris: Secondary | ICD-10-CM | POA: Diagnosis not present

## 2023-08-15 DIAGNOSIS — I700 Atherosclerosis of aorta: Secondary | ICD-10-CM | POA: Diagnosis not present

## 2023-08-15 DIAGNOSIS — N183 Chronic kidney disease, stage 3 unspecified: Secondary | ICD-10-CM | POA: Diagnosis not present

## 2023-08-15 DIAGNOSIS — K219 Gastro-esophageal reflux disease without esophagitis: Secondary | ICD-10-CM | POA: Diagnosis not present

## 2023-08-15 DIAGNOSIS — M545 Low back pain, unspecified: Secondary | ICD-10-CM | POA: Diagnosis not present

## 2023-08-15 DIAGNOSIS — E1122 Type 2 diabetes mellitus with diabetic chronic kidney disease: Secondary | ICD-10-CM | POA: Diagnosis not present

## 2023-08-15 DIAGNOSIS — H269 Unspecified cataract: Secondary | ICD-10-CM | POA: Diagnosis not present

## 2023-08-15 DIAGNOSIS — E039 Hypothyroidism, unspecified: Secondary | ICD-10-CM | POA: Diagnosis not present

## 2023-08-26 DIAGNOSIS — G8929 Other chronic pain: Secondary | ICD-10-CM | POA: Diagnosis not present

## 2023-08-26 DIAGNOSIS — D62 Acute posthemorrhagic anemia: Secondary | ICD-10-CM | POA: Diagnosis not present

## 2023-08-26 DIAGNOSIS — D869 Sarcoidosis, unspecified: Secondary | ICD-10-CM | POA: Diagnosis not present

## 2023-08-26 DIAGNOSIS — H3093 Unspecified chorioretinal inflammation, bilateral: Secondary | ICD-10-CM | POA: Diagnosis not present

## 2023-08-26 DIAGNOSIS — K219 Gastro-esophageal reflux disease without esophagitis: Secondary | ICD-10-CM | POA: Diagnosis not present

## 2023-08-26 DIAGNOSIS — M4800 Spinal stenosis, site unspecified: Secondary | ICD-10-CM | POA: Diagnosis not present

## 2023-08-26 DIAGNOSIS — E039 Hypothyroidism, unspecified: Secondary | ICD-10-CM | POA: Diagnosis not present

## 2023-08-26 DIAGNOSIS — H9193 Unspecified hearing loss, bilateral: Secondary | ICD-10-CM | POA: Diagnosis not present

## 2023-08-26 DIAGNOSIS — N183 Chronic kidney disease, stage 3 unspecified: Secondary | ICD-10-CM | POA: Diagnosis not present

## 2023-08-26 DIAGNOSIS — H53452 Other localized visual field defect, left eye: Secondary | ICD-10-CM | POA: Diagnosis not present

## 2023-08-26 DIAGNOSIS — I2510 Atherosclerotic heart disease of native coronary artery without angina pectoris: Secondary | ICD-10-CM | POA: Diagnosis not present

## 2023-08-26 DIAGNOSIS — I129 Hypertensive chronic kidney disease with stage 1 through stage 4 chronic kidney disease, or unspecified chronic kidney disease: Secondary | ICD-10-CM | POA: Diagnosis not present

## 2023-08-26 DIAGNOSIS — J309 Allergic rhinitis, unspecified: Secondary | ICD-10-CM | POA: Diagnosis not present

## 2023-08-26 DIAGNOSIS — M1990 Unspecified osteoarthritis, unspecified site: Secondary | ICD-10-CM | POA: Diagnosis not present

## 2023-08-26 DIAGNOSIS — I700 Atherosclerosis of aorta: Secondary | ICD-10-CM | POA: Diagnosis not present

## 2023-08-26 DIAGNOSIS — M545 Low back pain, unspecified: Secondary | ICD-10-CM | POA: Diagnosis not present

## 2023-08-26 DIAGNOSIS — E785 Hyperlipidemia, unspecified: Secondary | ICD-10-CM | POA: Diagnosis not present

## 2023-08-26 DIAGNOSIS — H3530 Unspecified macular degeneration: Secondary | ICD-10-CM | POA: Diagnosis not present

## 2023-08-26 DIAGNOSIS — H269 Unspecified cataract: Secondary | ICD-10-CM | POA: Diagnosis not present

## 2023-08-26 DIAGNOSIS — E1122 Type 2 diabetes mellitus with diabetic chronic kidney disease: Secondary | ICD-10-CM | POA: Diagnosis not present

## 2023-08-26 DIAGNOSIS — H409 Unspecified glaucoma: Secondary | ICD-10-CM | POA: Diagnosis not present

## 2023-08-26 DIAGNOSIS — H5461 Unqualified visual loss, right eye, normal vision left eye: Secondary | ICD-10-CM | POA: Diagnosis not present

## 2023-08-26 DIAGNOSIS — M8580 Other specified disorders of bone density and structure, unspecified site: Secondary | ICD-10-CM | POA: Diagnosis not present

## 2023-08-27 DIAGNOSIS — H3093 Unspecified chorioretinal inflammation, bilateral: Secondary | ICD-10-CM | POA: Diagnosis not present

## 2023-08-27 DIAGNOSIS — K219 Gastro-esophageal reflux disease without esophagitis: Secondary | ICD-10-CM | POA: Diagnosis not present

## 2023-08-27 DIAGNOSIS — E039 Hypothyroidism, unspecified: Secondary | ICD-10-CM | POA: Diagnosis not present

## 2023-08-27 DIAGNOSIS — D62 Acute posthemorrhagic anemia: Secondary | ICD-10-CM | POA: Diagnosis not present

## 2023-08-27 DIAGNOSIS — E1122 Type 2 diabetes mellitus with diabetic chronic kidney disease: Secondary | ICD-10-CM | POA: Diagnosis not present

## 2023-08-27 DIAGNOSIS — J309 Allergic rhinitis, unspecified: Secondary | ICD-10-CM | POA: Diagnosis not present

## 2023-08-27 DIAGNOSIS — H5461 Unqualified visual loss, right eye, normal vision left eye: Secondary | ICD-10-CM | POA: Diagnosis not present

## 2023-08-27 DIAGNOSIS — D869 Sarcoidosis, unspecified: Secondary | ICD-10-CM | POA: Diagnosis not present

## 2023-08-27 DIAGNOSIS — H53452 Other localized visual field defect, left eye: Secondary | ICD-10-CM | POA: Diagnosis not present

## 2023-08-27 DIAGNOSIS — H3530 Unspecified macular degeneration: Secondary | ICD-10-CM | POA: Diagnosis not present

## 2023-08-27 DIAGNOSIS — N183 Chronic kidney disease, stage 3 unspecified: Secondary | ICD-10-CM | POA: Diagnosis not present

## 2023-08-27 DIAGNOSIS — E785 Hyperlipidemia, unspecified: Secondary | ICD-10-CM | POA: Diagnosis not present

## 2023-08-27 DIAGNOSIS — M1990 Unspecified osteoarthritis, unspecified site: Secondary | ICD-10-CM | POA: Diagnosis not present

## 2023-08-27 DIAGNOSIS — G8929 Other chronic pain: Secondary | ICD-10-CM | POA: Diagnosis not present

## 2023-08-27 DIAGNOSIS — M8580 Other specified disorders of bone density and structure, unspecified site: Secondary | ICD-10-CM | POA: Diagnosis not present

## 2023-08-27 DIAGNOSIS — H9193 Unspecified hearing loss, bilateral: Secondary | ICD-10-CM | POA: Diagnosis not present

## 2023-08-27 DIAGNOSIS — M545 Low back pain, unspecified: Secondary | ICD-10-CM | POA: Diagnosis not present

## 2023-08-27 DIAGNOSIS — H269 Unspecified cataract: Secondary | ICD-10-CM | POA: Diagnosis not present

## 2023-08-27 DIAGNOSIS — H409 Unspecified glaucoma: Secondary | ICD-10-CM | POA: Diagnosis not present

## 2023-08-27 DIAGNOSIS — I700 Atherosclerosis of aorta: Secondary | ICD-10-CM | POA: Diagnosis not present

## 2023-08-27 DIAGNOSIS — I2510 Atherosclerotic heart disease of native coronary artery without angina pectoris: Secondary | ICD-10-CM | POA: Diagnosis not present

## 2023-08-27 DIAGNOSIS — I129 Hypertensive chronic kidney disease with stage 1 through stage 4 chronic kidney disease, or unspecified chronic kidney disease: Secondary | ICD-10-CM | POA: Diagnosis not present

## 2023-08-27 DIAGNOSIS — M4800 Spinal stenosis, site unspecified: Secondary | ICD-10-CM | POA: Diagnosis not present

## 2023-09-02 DIAGNOSIS — S0502XA Injury of conjunctiva and corneal abrasion without foreign body, left eye, initial encounter: Secondary | ICD-10-CM | POA: Diagnosis not present

## 2023-09-02 DIAGNOSIS — H5712 Ocular pain, left eye: Secondary | ICD-10-CM | POA: Diagnosis not present

## 2023-09-12 DIAGNOSIS — H3530 Unspecified macular degeneration: Secondary | ICD-10-CM | POA: Diagnosis not present

## 2023-09-12 DIAGNOSIS — N183 Chronic kidney disease, stage 3 unspecified: Secondary | ICD-10-CM | POA: Diagnosis not present

## 2023-09-12 DIAGNOSIS — H9193 Unspecified hearing loss, bilateral: Secondary | ICD-10-CM | POA: Diagnosis not present

## 2023-09-12 DIAGNOSIS — M1990 Unspecified osteoarthritis, unspecified site: Secondary | ICD-10-CM | POA: Diagnosis not present

## 2023-09-12 DIAGNOSIS — H53452 Other localized visual field defect, left eye: Secondary | ICD-10-CM | POA: Diagnosis not present

## 2023-09-12 DIAGNOSIS — H5461 Unqualified visual loss, right eye, normal vision left eye: Secondary | ICD-10-CM | POA: Diagnosis not present

## 2023-09-12 DIAGNOSIS — D869 Sarcoidosis, unspecified: Secondary | ICD-10-CM | POA: Diagnosis not present

## 2023-09-12 DIAGNOSIS — E1122 Type 2 diabetes mellitus with diabetic chronic kidney disease: Secondary | ICD-10-CM | POA: Diagnosis not present

## 2023-09-12 DIAGNOSIS — M4800 Spinal stenosis, site unspecified: Secondary | ICD-10-CM | POA: Diagnosis not present

## 2023-09-12 DIAGNOSIS — D62 Acute posthemorrhagic anemia: Secondary | ICD-10-CM | POA: Diagnosis not present

## 2023-09-12 DIAGNOSIS — H409 Unspecified glaucoma: Secondary | ICD-10-CM | POA: Diagnosis not present

## 2023-09-12 DIAGNOSIS — M8580 Other specified disorders of bone density and structure, unspecified site: Secondary | ICD-10-CM | POA: Diagnosis not present

## 2023-09-12 DIAGNOSIS — I2510 Atherosclerotic heart disease of native coronary artery without angina pectoris: Secondary | ICD-10-CM | POA: Diagnosis not present

## 2023-09-12 DIAGNOSIS — I129 Hypertensive chronic kidney disease with stage 1 through stage 4 chronic kidney disease, or unspecified chronic kidney disease: Secondary | ICD-10-CM | POA: Diagnosis not present

## 2023-09-12 DIAGNOSIS — G8929 Other chronic pain: Secondary | ICD-10-CM | POA: Diagnosis not present

## 2023-09-12 DIAGNOSIS — I700 Atherosclerosis of aorta: Secondary | ICD-10-CM | POA: Diagnosis not present

## 2023-09-12 DIAGNOSIS — H3093 Unspecified chorioretinal inflammation, bilateral: Secondary | ICD-10-CM | POA: Diagnosis not present

## 2023-09-12 DIAGNOSIS — J309 Allergic rhinitis, unspecified: Secondary | ICD-10-CM | POA: Diagnosis not present

## 2023-09-12 DIAGNOSIS — E039 Hypothyroidism, unspecified: Secondary | ICD-10-CM | POA: Diagnosis not present

## 2023-09-12 DIAGNOSIS — M545 Low back pain, unspecified: Secondary | ICD-10-CM | POA: Diagnosis not present

## 2023-09-12 DIAGNOSIS — H269 Unspecified cataract: Secondary | ICD-10-CM | POA: Diagnosis not present

## 2023-09-12 DIAGNOSIS — E785 Hyperlipidemia, unspecified: Secondary | ICD-10-CM | POA: Diagnosis not present

## 2023-09-12 DIAGNOSIS — K219 Gastro-esophageal reflux disease without esophagitis: Secondary | ICD-10-CM | POA: Diagnosis not present

## 2023-09-13 DIAGNOSIS — M545 Low back pain, unspecified: Secondary | ICD-10-CM | POA: Diagnosis not present

## 2023-09-13 DIAGNOSIS — H3530 Unspecified macular degeneration: Secondary | ICD-10-CM | POA: Diagnosis not present

## 2023-09-13 DIAGNOSIS — M8580 Other specified disorders of bone density and structure, unspecified site: Secondary | ICD-10-CM | POA: Diagnosis not present

## 2023-09-13 DIAGNOSIS — J309 Allergic rhinitis, unspecified: Secondary | ICD-10-CM | POA: Diagnosis not present

## 2023-09-13 DIAGNOSIS — D62 Acute posthemorrhagic anemia: Secondary | ICD-10-CM | POA: Diagnosis not present

## 2023-09-13 DIAGNOSIS — E785 Hyperlipidemia, unspecified: Secondary | ICD-10-CM | POA: Diagnosis not present

## 2023-09-13 DIAGNOSIS — E039 Hypothyroidism, unspecified: Secondary | ICD-10-CM | POA: Diagnosis not present

## 2023-09-13 DIAGNOSIS — N183 Chronic kidney disease, stage 3 unspecified: Secondary | ICD-10-CM | POA: Diagnosis not present

## 2023-09-13 DIAGNOSIS — M4800 Spinal stenosis, site unspecified: Secondary | ICD-10-CM | POA: Diagnosis not present

## 2023-09-13 DIAGNOSIS — G8929 Other chronic pain: Secondary | ICD-10-CM | POA: Diagnosis not present

## 2023-09-13 DIAGNOSIS — K219 Gastro-esophageal reflux disease without esophagitis: Secondary | ICD-10-CM | POA: Diagnosis not present

## 2023-09-13 DIAGNOSIS — E1122 Type 2 diabetes mellitus with diabetic chronic kidney disease: Secondary | ICD-10-CM | POA: Diagnosis not present

## 2023-09-13 DIAGNOSIS — H53452 Other localized visual field defect, left eye: Secondary | ICD-10-CM | POA: Diagnosis not present

## 2023-09-13 DIAGNOSIS — H9193 Unspecified hearing loss, bilateral: Secondary | ICD-10-CM | POA: Diagnosis not present

## 2023-09-13 DIAGNOSIS — I700 Atherosclerosis of aorta: Secondary | ICD-10-CM | POA: Diagnosis not present

## 2023-09-13 DIAGNOSIS — H3093 Unspecified chorioretinal inflammation, bilateral: Secondary | ICD-10-CM | POA: Diagnosis not present

## 2023-09-13 DIAGNOSIS — I129 Hypertensive chronic kidney disease with stage 1 through stage 4 chronic kidney disease, or unspecified chronic kidney disease: Secondary | ICD-10-CM | POA: Diagnosis not present

## 2023-09-13 DIAGNOSIS — D869 Sarcoidosis, unspecified: Secondary | ICD-10-CM | POA: Diagnosis not present

## 2023-09-13 DIAGNOSIS — I2510 Atherosclerotic heart disease of native coronary artery without angina pectoris: Secondary | ICD-10-CM | POA: Diagnosis not present

## 2023-09-13 DIAGNOSIS — M1990 Unspecified osteoarthritis, unspecified site: Secondary | ICD-10-CM | POA: Diagnosis not present

## 2023-09-13 DIAGNOSIS — H5461 Unqualified visual loss, right eye, normal vision left eye: Secondary | ICD-10-CM | POA: Diagnosis not present

## 2023-09-13 DIAGNOSIS — H409 Unspecified glaucoma: Secondary | ICD-10-CM | POA: Diagnosis not present

## 2023-09-13 DIAGNOSIS — H269 Unspecified cataract: Secondary | ICD-10-CM | POA: Diagnosis not present

## 2023-09-20 DIAGNOSIS — M545 Low back pain, unspecified: Secondary | ICD-10-CM | POA: Diagnosis not present

## 2023-09-20 DIAGNOSIS — D869 Sarcoidosis, unspecified: Secondary | ICD-10-CM | POA: Diagnosis not present

## 2023-09-20 DIAGNOSIS — H3530 Unspecified macular degeneration: Secondary | ICD-10-CM | POA: Diagnosis not present

## 2023-09-20 DIAGNOSIS — I2510 Atherosclerotic heart disease of native coronary artery without angina pectoris: Secondary | ICD-10-CM | POA: Diagnosis not present

## 2023-09-20 DIAGNOSIS — H409 Unspecified glaucoma: Secondary | ICD-10-CM | POA: Diagnosis not present

## 2023-09-20 DIAGNOSIS — I700 Atherosclerosis of aorta: Secondary | ICD-10-CM | POA: Diagnosis not present

## 2023-09-20 DIAGNOSIS — Z85118 Personal history of other malignant neoplasm of bronchus and lung: Secondary | ICD-10-CM | POA: Diagnosis not present

## 2023-09-20 DIAGNOSIS — R7303 Prediabetes: Secondary | ICD-10-CM | POA: Diagnosis not present

## 2023-09-20 DIAGNOSIS — M1990 Unspecified osteoarthritis, unspecified site: Secondary | ICD-10-CM | POA: Diagnosis not present

## 2023-09-20 DIAGNOSIS — N183 Chronic kidney disease, stage 3 unspecified: Secondary | ICD-10-CM | POA: Diagnosis not present

## 2023-09-20 DIAGNOSIS — E039 Hypothyroidism, unspecified: Secondary | ICD-10-CM | POA: Diagnosis not present

## 2023-09-20 DIAGNOSIS — K219 Gastro-esophageal reflux disease without esophagitis: Secondary | ICD-10-CM | POA: Diagnosis not present

## 2023-09-20 DIAGNOSIS — M4800 Spinal stenosis, site unspecified: Secondary | ICD-10-CM | POA: Diagnosis not present

## 2023-09-20 DIAGNOSIS — E785 Hyperlipidemia, unspecified: Secondary | ICD-10-CM | POA: Diagnosis not present

## 2023-09-20 DIAGNOSIS — J309 Allergic rhinitis, unspecified: Secondary | ICD-10-CM | POA: Diagnosis not present

## 2023-09-20 DIAGNOSIS — H9193 Unspecified hearing loss, bilateral: Secondary | ICD-10-CM | POA: Diagnosis not present

## 2023-09-20 DIAGNOSIS — G8929 Other chronic pain: Secondary | ICD-10-CM | POA: Diagnosis not present

## 2023-09-20 DIAGNOSIS — I129 Hypertensive chronic kidney disease with stage 1 through stage 4 chronic kidney disease, or unspecified chronic kidney disease: Secondary | ICD-10-CM | POA: Diagnosis not present

## 2023-09-20 DIAGNOSIS — Z7982 Long term (current) use of aspirin: Secondary | ICD-10-CM | POA: Diagnosis not present

## 2023-09-20 DIAGNOSIS — H269 Unspecified cataract: Secondary | ICD-10-CM | POA: Diagnosis not present

## 2023-09-20 DIAGNOSIS — E1122 Type 2 diabetes mellitus with diabetic chronic kidney disease: Secondary | ICD-10-CM | POA: Diagnosis not present

## 2023-09-20 DIAGNOSIS — H548 Legal blindness, as defined in USA: Secondary | ICD-10-CM | POA: Diagnosis not present

## 2023-09-20 DIAGNOSIS — M8580 Other specified disorders of bone density and structure, unspecified site: Secondary | ICD-10-CM | POA: Diagnosis not present

## 2023-09-23 DIAGNOSIS — G8929 Other chronic pain: Secondary | ICD-10-CM | POA: Diagnosis not present

## 2023-09-23 DIAGNOSIS — H3530 Unspecified macular degeneration: Secondary | ICD-10-CM | POA: Diagnosis not present

## 2023-09-23 DIAGNOSIS — N183 Chronic kidney disease, stage 3 unspecified: Secondary | ICD-10-CM | POA: Diagnosis not present

## 2023-09-23 DIAGNOSIS — M545 Low back pain, unspecified: Secondary | ICD-10-CM | POA: Diagnosis not present

## 2023-09-23 DIAGNOSIS — Z85118 Personal history of other malignant neoplasm of bronchus and lung: Secondary | ICD-10-CM | POA: Diagnosis not present

## 2023-09-23 DIAGNOSIS — I129 Hypertensive chronic kidney disease with stage 1 through stage 4 chronic kidney disease, or unspecified chronic kidney disease: Secondary | ICD-10-CM | POA: Diagnosis not present

## 2023-09-23 DIAGNOSIS — E785 Hyperlipidemia, unspecified: Secondary | ICD-10-CM | POA: Diagnosis not present

## 2023-09-23 DIAGNOSIS — R7303 Prediabetes: Secondary | ICD-10-CM | POA: Diagnosis not present

## 2023-09-23 DIAGNOSIS — H548 Legal blindness, as defined in USA: Secondary | ICD-10-CM | POA: Diagnosis not present

## 2023-09-23 DIAGNOSIS — M1990 Unspecified osteoarthritis, unspecified site: Secondary | ICD-10-CM | POA: Diagnosis not present

## 2023-09-23 DIAGNOSIS — J309 Allergic rhinitis, unspecified: Secondary | ICD-10-CM | POA: Diagnosis not present

## 2023-09-23 DIAGNOSIS — M4800 Spinal stenosis, site unspecified: Secondary | ICD-10-CM | POA: Diagnosis not present

## 2023-09-23 DIAGNOSIS — M8580 Other specified disorders of bone density and structure, unspecified site: Secondary | ICD-10-CM | POA: Diagnosis not present

## 2023-09-23 DIAGNOSIS — D869 Sarcoidosis, unspecified: Secondary | ICD-10-CM | POA: Diagnosis not present

## 2023-09-23 DIAGNOSIS — H9193 Unspecified hearing loss, bilateral: Secondary | ICD-10-CM | POA: Diagnosis not present

## 2023-09-23 DIAGNOSIS — H269 Unspecified cataract: Secondary | ICD-10-CM | POA: Diagnosis not present

## 2023-09-23 DIAGNOSIS — K219 Gastro-esophageal reflux disease without esophagitis: Secondary | ICD-10-CM | POA: Diagnosis not present

## 2023-09-23 DIAGNOSIS — E1122 Type 2 diabetes mellitus with diabetic chronic kidney disease: Secondary | ICD-10-CM | POA: Diagnosis not present

## 2023-09-23 DIAGNOSIS — Z7982 Long term (current) use of aspirin: Secondary | ICD-10-CM | POA: Diagnosis not present

## 2023-09-23 DIAGNOSIS — H409 Unspecified glaucoma: Secondary | ICD-10-CM | POA: Diagnosis not present

## 2023-09-23 DIAGNOSIS — E039 Hypothyroidism, unspecified: Secondary | ICD-10-CM | POA: Diagnosis not present

## 2023-09-23 DIAGNOSIS — I2510 Atherosclerotic heart disease of native coronary artery without angina pectoris: Secondary | ICD-10-CM | POA: Diagnosis not present

## 2023-09-23 DIAGNOSIS — I700 Atherosclerosis of aorta: Secondary | ICD-10-CM | POA: Diagnosis not present

## 2023-09-26 DIAGNOSIS — H548 Legal blindness, as defined in USA: Secondary | ICD-10-CM | POA: Diagnosis not present

## 2023-09-26 DIAGNOSIS — M1990 Unspecified osteoarthritis, unspecified site: Secondary | ICD-10-CM | POA: Diagnosis not present

## 2023-09-26 DIAGNOSIS — Z7982 Long term (current) use of aspirin: Secondary | ICD-10-CM | POA: Diagnosis not present

## 2023-09-26 DIAGNOSIS — M545 Low back pain, unspecified: Secondary | ICD-10-CM | POA: Diagnosis not present

## 2023-09-26 DIAGNOSIS — I2510 Atherosclerotic heart disease of native coronary artery without angina pectoris: Secondary | ICD-10-CM | POA: Diagnosis not present

## 2023-09-26 DIAGNOSIS — M4800 Spinal stenosis, site unspecified: Secondary | ICD-10-CM | POA: Diagnosis not present

## 2023-09-26 DIAGNOSIS — D869 Sarcoidosis, unspecified: Secondary | ICD-10-CM | POA: Diagnosis not present

## 2023-09-26 DIAGNOSIS — H3530 Unspecified macular degeneration: Secondary | ICD-10-CM | POA: Diagnosis not present

## 2023-09-26 DIAGNOSIS — G8929 Other chronic pain: Secondary | ICD-10-CM | POA: Diagnosis not present

## 2023-09-26 DIAGNOSIS — J309 Allergic rhinitis, unspecified: Secondary | ICD-10-CM | POA: Diagnosis not present

## 2023-09-26 DIAGNOSIS — R7303 Prediabetes: Secondary | ICD-10-CM | POA: Diagnosis not present

## 2023-09-26 DIAGNOSIS — E1122 Type 2 diabetes mellitus with diabetic chronic kidney disease: Secondary | ICD-10-CM | POA: Diagnosis not present

## 2023-09-26 DIAGNOSIS — H269 Unspecified cataract: Secondary | ICD-10-CM | POA: Diagnosis not present

## 2023-09-26 DIAGNOSIS — N183 Chronic kidney disease, stage 3 unspecified: Secondary | ICD-10-CM | POA: Diagnosis not present

## 2023-09-26 DIAGNOSIS — M8580 Other specified disorders of bone density and structure, unspecified site: Secondary | ICD-10-CM | POA: Diagnosis not present

## 2023-09-26 DIAGNOSIS — I129 Hypertensive chronic kidney disease with stage 1 through stage 4 chronic kidney disease, or unspecified chronic kidney disease: Secondary | ICD-10-CM | POA: Diagnosis not present

## 2023-09-26 DIAGNOSIS — E039 Hypothyroidism, unspecified: Secondary | ICD-10-CM | POA: Diagnosis not present

## 2023-09-26 DIAGNOSIS — I700 Atherosclerosis of aorta: Secondary | ICD-10-CM | POA: Diagnosis not present

## 2023-09-26 DIAGNOSIS — K219 Gastro-esophageal reflux disease without esophagitis: Secondary | ICD-10-CM | POA: Diagnosis not present

## 2023-09-26 DIAGNOSIS — H9193 Unspecified hearing loss, bilateral: Secondary | ICD-10-CM | POA: Diagnosis not present

## 2023-09-26 DIAGNOSIS — H409 Unspecified glaucoma: Secondary | ICD-10-CM | POA: Diagnosis not present

## 2023-09-26 DIAGNOSIS — Z85118 Personal history of other malignant neoplasm of bronchus and lung: Secondary | ICD-10-CM | POA: Diagnosis not present

## 2023-09-26 DIAGNOSIS — E785 Hyperlipidemia, unspecified: Secondary | ICD-10-CM | POA: Diagnosis not present

## 2023-10-09 ENCOUNTER — Ambulatory Visit: Payer: Medicare Other

## 2023-10-10 DIAGNOSIS — H9193 Unspecified hearing loss, bilateral: Secondary | ICD-10-CM | POA: Diagnosis not present

## 2023-10-10 DIAGNOSIS — M8580 Other specified disorders of bone density and structure, unspecified site: Secondary | ICD-10-CM | POA: Diagnosis not present

## 2023-10-10 DIAGNOSIS — D869 Sarcoidosis, unspecified: Secondary | ICD-10-CM | POA: Diagnosis not present

## 2023-10-10 DIAGNOSIS — H548 Legal blindness, as defined in USA: Secondary | ICD-10-CM | POA: Diagnosis not present

## 2023-10-10 DIAGNOSIS — G8929 Other chronic pain: Secondary | ICD-10-CM | POA: Diagnosis not present

## 2023-10-10 DIAGNOSIS — J309 Allergic rhinitis, unspecified: Secondary | ICD-10-CM | POA: Diagnosis not present

## 2023-10-10 DIAGNOSIS — M1990 Unspecified osteoarthritis, unspecified site: Secondary | ICD-10-CM | POA: Diagnosis not present

## 2023-10-10 DIAGNOSIS — M545 Low back pain, unspecified: Secondary | ICD-10-CM | POA: Diagnosis not present

## 2023-10-10 DIAGNOSIS — H3530 Unspecified macular degeneration: Secondary | ICD-10-CM | POA: Diagnosis not present

## 2023-10-10 DIAGNOSIS — K219 Gastro-esophageal reflux disease without esophagitis: Secondary | ICD-10-CM | POA: Diagnosis not present

## 2023-10-10 DIAGNOSIS — I700 Atherosclerosis of aorta: Secondary | ICD-10-CM | POA: Diagnosis not present

## 2023-10-10 DIAGNOSIS — H409 Unspecified glaucoma: Secondary | ICD-10-CM | POA: Diagnosis not present

## 2023-10-10 DIAGNOSIS — Z7982 Long term (current) use of aspirin: Secondary | ICD-10-CM | POA: Diagnosis not present

## 2023-10-10 DIAGNOSIS — N183 Chronic kidney disease, stage 3 unspecified: Secondary | ICD-10-CM | POA: Diagnosis not present

## 2023-10-10 DIAGNOSIS — I129 Hypertensive chronic kidney disease with stage 1 through stage 4 chronic kidney disease, or unspecified chronic kidney disease: Secondary | ICD-10-CM | POA: Diagnosis not present

## 2023-10-10 DIAGNOSIS — R7303 Prediabetes: Secondary | ICD-10-CM | POA: Diagnosis not present

## 2023-10-10 DIAGNOSIS — E039 Hypothyroidism, unspecified: Secondary | ICD-10-CM | POA: Diagnosis not present

## 2023-10-10 DIAGNOSIS — E1122 Type 2 diabetes mellitus with diabetic chronic kidney disease: Secondary | ICD-10-CM | POA: Diagnosis not present

## 2023-10-10 DIAGNOSIS — Z85118 Personal history of other malignant neoplasm of bronchus and lung: Secondary | ICD-10-CM | POA: Diagnosis not present

## 2023-10-10 DIAGNOSIS — M4800 Spinal stenosis, site unspecified: Secondary | ICD-10-CM | POA: Diagnosis not present

## 2023-10-10 DIAGNOSIS — H269 Unspecified cataract: Secondary | ICD-10-CM | POA: Diagnosis not present

## 2023-10-10 DIAGNOSIS — I2510 Atherosclerotic heart disease of native coronary artery without angina pectoris: Secondary | ICD-10-CM | POA: Diagnosis not present

## 2023-10-10 DIAGNOSIS — E785 Hyperlipidemia, unspecified: Secondary | ICD-10-CM | POA: Diagnosis not present

## 2023-10-15 DIAGNOSIS — K219 Gastro-esophageal reflux disease without esophagitis: Secondary | ICD-10-CM | POA: Diagnosis not present

## 2023-10-15 DIAGNOSIS — R7303 Prediabetes: Secondary | ICD-10-CM | POA: Diagnosis not present

## 2023-10-15 DIAGNOSIS — E1122 Type 2 diabetes mellitus with diabetic chronic kidney disease: Secondary | ICD-10-CM | POA: Diagnosis not present

## 2023-10-15 DIAGNOSIS — N183 Chronic kidney disease, stage 3 unspecified: Secondary | ICD-10-CM | POA: Diagnosis not present

## 2023-10-15 DIAGNOSIS — G8929 Other chronic pain: Secondary | ICD-10-CM | POA: Diagnosis not present

## 2023-10-15 DIAGNOSIS — E039 Hypothyroidism, unspecified: Secondary | ICD-10-CM | POA: Diagnosis not present

## 2023-10-15 DIAGNOSIS — H269 Unspecified cataract: Secondary | ICD-10-CM | POA: Diagnosis not present

## 2023-10-15 DIAGNOSIS — D869 Sarcoidosis, unspecified: Secondary | ICD-10-CM | POA: Diagnosis not present

## 2023-10-15 DIAGNOSIS — Z7982 Long term (current) use of aspirin: Secondary | ICD-10-CM | POA: Diagnosis not present

## 2023-10-15 DIAGNOSIS — M545 Low back pain, unspecified: Secondary | ICD-10-CM | POA: Diagnosis not present

## 2023-10-15 DIAGNOSIS — E785 Hyperlipidemia, unspecified: Secondary | ICD-10-CM | POA: Diagnosis not present

## 2023-10-15 DIAGNOSIS — J309 Allergic rhinitis, unspecified: Secondary | ICD-10-CM | POA: Diagnosis not present

## 2023-10-15 DIAGNOSIS — M4800 Spinal stenosis, site unspecified: Secondary | ICD-10-CM | POA: Diagnosis not present

## 2023-10-15 DIAGNOSIS — I2510 Atherosclerotic heart disease of native coronary artery without angina pectoris: Secondary | ICD-10-CM | POA: Diagnosis not present

## 2023-10-15 DIAGNOSIS — H548 Legal blindness, as defined in USA: Secondary | ICD-10-CM | POA: Diagnosis not present

## 2023-10-15 DIAGNOSIS — Z85118 Personal history of other malignant neoplasm of bronchus and lung: Secondary | ICD-10-CM | POA: Diagnosis not present

## 2023-10-15 DIAGNOSIS — I129 Hypertensive chronic kidney disease with stage 1 through stage 4 chronic kidney disease, or unspecified chronic kidney disease: Secondary | ICD-10-CM | POA: Diagnosis not present

## 2023-10-15 DIAGNOSIS — H409 Unspecified glaucoma: Secondary | ICD-10-CM | POA: Diagnosis not present

## 2023-10-15 DIAGNOSIS — H3530 Unspecified macular degeneration: Secondary | ICD-10-CM | POA: Diagnosis not present

## 2023-10-15 DIAGNOSIS — I700 Atherosclerosis of aorta: Secondary | ICD-10-CM | POA: Diagnosis not present

## 2023-10-15 DIAGNOSIS — H9193 Unspecified hearing loss, bilateral: Secondary | ICD-10-CM | POA: Diagnosis not present

## 2023-10-15 DIAGNOSIS — M1990 Unspecified osteoarthritis, unspecified site: Secondary | ICD-10-CM | POA: Diagnosis not present

## 2023-10-15 DIAGNOSIS — M8580 Other specified disorders of bone density and structure, unspecified site: Secondary | ICD-10-CM | POA: Diagnosis not present

## 2023-10-21 DIAGNOSIS — G8929 Other chronic pain: Secondary | ICD-10-CM | POA: Diagnosis not present

## 2023-10-21 DIAGNOSIS — E785 Hyperlipidemia, unspecified: Secondary | ICD-10-CM | POA: Diagnosis not present

## 2023-10-21 DIAGNOSIS — N183 Chronic kidney disease, stage 3 unspecified: Secondary | ICD-10-CM | POA: Diagnosis not present

## 2023-10-21 DIAGNOSIS — H269 Unspecified cataract: Secondary | ICD-10-CM | POA: Diagnosis not present

## 2023-10-21 DIAGNOSIS — H9193 Unspecified hearing loss, bilateral: Secondary | ICD-10-CM | POA: Diagnosis not present

## 2023-10-21 DIAGNOSIS — E039 Hypothyroidism, unspecified: Secondary | ICD-10-CM | POA: Diagnosis not present

## 2023-10-21 DIAGNOSIS — E1122 Type 2 diabetes mellitus with diabetic chronic kidney disease: Secondary | ICD-10-CM | POA: Diagnosis not present

## 2023-10-21 DIAGNOSIS — I2510 Atherosclerotic heart disease of native coronary artery without angina pectoris: Secondary | ICD-10-CM | POA: Diagnosis not present

## 2023-10-21 DIAGNOSIS — M1990 Unspecified osteoarthritis, unspecified site: Secondary | ICD-10-CM | POA: Diagnosis not present

## 2023-10-21 DIAGNOSIS — Z7982 Long term (current) use of aspirin: Secondary | ICD-10-CM | POA: Diagnosis not present

## 2023-10-21 DIAGNOSIS — M545 Low back pain, unspecified: Secondary | ICD-10-CM | POA: Diagnosis not present

## 2023-10-21 DIAGNOSIS — H3530 Unspecified macular degeneration: Secondary | ICD-10-CM | POA: Diagnosis not present

## 2023-10-21 DIAGNOSIS — K219 Gastro-esophageal reflux disease without esophagitis: Secondary | ICD-10-CM | POA: Diagnosis not present

## 2023-10-21 DIAGNOSIS — I700 Atherosclerosis of aorta: Secondary | ICD-10-CM | POA: Diagnosis not present

## 2023-10-21 DIAGNOSIS — M8580 Other specified disorders of bone density and structure, unspecified site: Secondary | ICD-10-CM | POA: Diagnosis not present

## 2023-10-21 DIAGNOSIS — D869 Sarcoidosis, unspecified: Secondary | ICD-10-CM | POA: Diagnosis not present

## 2023-10-21 DIAGNOSIS — I129 Hypertensive chronic kidney disease with stage 1 through stage 4 chronic kidney disease, or unspecified chronic kidney disease: Secondary | ICD-10-CM | POA: Diagnosis not present

## 2023-10-21 DIAGNOSIS — M4800 Spinal stenosis, site unspecified: Secondary | ICD-10-CM | POA: Diagnosis not present

## 2023-10-21 DIAGNOSIS — H548 Legal blindness, as defined in USA: Secondary | ICD-10-CM | POA: Diagnosis not present

## 2023-10-21 DIAGNOSIS — R7303 Prediabetes: Secondary | ICD-10-CM | POA: Diagnosis not present

## 2023-10-21 DIAGNOSIS — H409 Unspecified glaucoma: Secondary | ICD-10-CM | POA: Diagnosis not present

## 2023-10-21 DIAGNOSIS — J309 Allergic rhinitis, unspecified: Secondary | ICD-10-CM | POA: Diagnosis not present

## 2023-10-21 DIAGNOSIS — Z85118 Personal history of other malignant neoplasm of bronchus and lung: Secondary | ICD-10-CM | POA: Diagnosis not present

## 2023-11-12 DIAGNOSIS — M4800 Spinal stenosis, site unspecified: Secondary | ICD-10-CM | POA: Diagnosis not present

## 2023-11-12 DIAGNOSIS — H9193 Unspecified hearing loss, bilateral: Secondary | ICD-10-CM | POA: Diagnosis not present

## 2023-11-12 DIAGNOSIS — M8580 Other specified disorders of bone density and structure, unspecified site: Secondary | ICD-10-CM | POA: Diagnosis not present

## 2023-11-12 DIAGNOSIS — R7303 Prediabetes: Secondary | ICD-10-CM | POA: Diagnosis not present

## 2023-11-12 DIAGNOSIS — N183 Chronic kidney disease, stage 3 unspecified: Secondary | ICD-10-CM | POA: Diagnosis not present

## 2023-11-12 DIAGNOSIS — H269 Unspecified cataract: Secondary | ICD-10-CM | POA: Diagnosis not present

## 2023-11-12 DIAGNOSIS — I2510 Atherosclerotic heart disease of native coronary artery without angina pectoris: Secondary | ICD-10-CM | POA: Diagnosis not present

## 2023-11-12 DIAGNOSIS — H409 Unspecified glaucoma: Secondary | ICD-10-CM | POA: Diagnosis not present

## 2023-11-12 DIAGNOSIS — D869 Sarcoidosis, unspecified: Secondary | ICD-10-CM | POA: Diagnosis not present

## 2023-11-12 DIAGNOSIS — G8929 Other chronic pain: Secondary | ICD-10-CM | POA: Diagnosis not present

## 2023-11-12 DIAGNOSIS — E039 Hypothyroidism, unspecified: Secondary | ICD-10-CM | POA: Diagnosis not present

## 2023-11-12 DIAGNOSIS — E785 Hyperlipidemia, unspecified: Secondary | ICD-10-CM | POA: Diagnosis not present

## 2023-11-12 DIAGNOSIS — H3530 Unspecified macular degeneration: Secondary | ICD-10-CM | POA: Diagnosis not present

## 2023-11-12 DIAGNOSIS — Z85118 Personal history of other malignant neoplasm of bronchus and lung: Secondary | ICD-10-CM | POA: Diagnosis not present

## 2023-11-12 DIAGNOSIS — K219 Gastro-esophageal reflux disease without esophagitis: Secondary | ICD-10-CM | POA: Diagnosis not present

## 2023-11-12 DIAGNOSIS — J309 Allergic rhinitis, unspecified: Secondary | ICD-10-CM | POA: Diagnosis not present

## 2023-11-12 DIAGNOSIS — I129 Hypertensive chronic kidney disease with stage 1 through stage 4 chronic kidney disease, or unspecified chronic kidney disease: Secondary | ICD-10-CM | POA: Diagnosis not present

## 2023-11-12 DIAGNOSIS — E1122 Type 2 diabetes mellitus with diabetic chronic kidney disease: Secondary | ICD-10-CM | POA: Diagnosis not present

## 2023-11-12 DIAGNOSIS — M1990 Unspecified osteoarthritis, unspecified site: Secondary | ICD-10-CM | POA: Diagnosis not present

## 2023-11-12 DIAGNOSIS — H548 Legal blindness, as defined in USA: Secondary | ICD-10-CM | POA: Diagnosis not present

## 2023-11-12 DIAGNOSIS — I700 Atherosclerosis of aorta: Secondary | ICD-10-CM | POA: Diagnosis not present

## 2023-11-12 DIAGNOSIS — Z7982 Long term (current) use of aspirin: Secondary | ICD-10-CM | POA: Diagnosis not present

## 2023-11-12 DIAGNOSIS — M545 Low back pain, unspecified: Secondary | ICD-10-CM | POA: Diagnosis not present

## 2023-11-22 ENCOUNTER — Ambulatory Visit

## 2023-11-22 VITALS — Ht 66.5 in | Wt 144.0 lb

## 2023-11-22 DIAGNOSIS — Z0000 Encounter for general adult medical examination without abnormal findings: Secondary | ICD-10-CM

## 2023-11-22 NOTE — Progress Notes (Signed)
 Subjective:   Danielle Newman is a 89 y.o. who presents for a Medicare Wellness preventive visit.  Visit Complete: Virtual I connected with  [place] on 11/22/23 by a audio enabled telemedicine application and verified that I am speaking with the correct person using two identifiers.  Patient Location: Home  Provider Location: Office/Clinic  I discussed the limitations of evaluation and management by telemedicine. The patient expressed understanding and agreed to proceed.  Vital Signs: Because this visit was a virtual/telehealth visit, some criteria may be missing or patient reported. Any vitals not documented were not able to be obtained and vitals that have been documented are patient reported.  VideoDeclined- This patient declined Librarian, academic. Therefore the visit was completed with audio only.  Persons Participating in Visit: Patient.  AWV Questionnaire: No: Patient Medicare AWV questionnaire was not completed prior to this visit.  Cardiac Risk Factors include: advanced age (>50men, >54 women);diabetes mellitus;dyslipidemia     Objective:    Today's Vitals   11/22/23 1422  Weight: 144 lb (65.3 kg)  Height: 5' 6.5" (1.689 m)   Body mass index is 22.89 kg/m.     11/22/2023    2:20 PM 07/16/2023   12:41 PM 07/15/2023    8:54 PM 07/15/2023   11:35 AM  Advanced Directives  Does Patient Have a Medical Advance Directive? No Yes Yes Yes  Type of Advance Directive  Living will Living will Living will  Does patient want to make changes to medical advance directive?   No - Patient declined   Would patient like information on creating a medical advance directive? Yes (MAU/Ambulatory/Procedural Areas - Information given)       Current Medications (verified) Outpatient Encounter Medications as of 11/22/2023  Medication Sig   Acidophilus Lactobacillus CAPS Take 1 capsule by mouth daily.   albuterol  (VENTOLIN  HFA) 108 (90 Base) MCG/ACT  inhaler Inhale 2 puffs into the lungs every 6 (six) hours as needed for wheezing or shortness of breath.   AMBULATORY NON FORMULARY MEDICATION Custom shoe lifts to treat acquired leg length discrepancy. [facility]   Fax # [phone]   aspirin EC 81 MG tablet Take 81 mg by mouth daily. Swallow whole.   Cholecalciferol (VITAMIN D3) 50 MCG (2000 UT) TABS Take 2,000 Units by mouth daily.   ciclopirox  (PENLAC ) 8 % solution Apply topically at bedtime. Apply over nail and surrounding skin. Apply daily over previous coat. After seven (7) days, may remove with alcohol  and continue cycle.   levothyroxine  (SYNTHROID ) 100 MCG tablet TAKE 1 TABLET BY MOUTH DAILY (Patient taking differently: Take 100 mcg by mouth daily before breakfast.)   lovastatin  (MEVACOR ) 40 MG tablet TAKE 1 TABLET BY MOUTH DAILY   melatonin 3 MG TABS tablet Take 3 mg by mouth at bedtime.   moxifloxacin (VIGAMOX) 0.5 % ophthalmic solution Place 1 drop into the left eye See admin instructions. Instill 1 drop into the left eye four times a day, alternating with generic Maxitrol - until the next office visit Administration times: 0800/1200/1600/2000   neomycin -polymyxin b-dexamethasone  (MAXITROL ) 3.5-10000-0.1 SUSP Place 1 drop into the left eye See admin instructions. Instill 1 drop into the left eye four times a day, alternating with generic Vigamox- until the next office visit Administration Times: 0600/1000/1400/1800   pantoprazole  (PROTONIX ) 40 MG tablet Take 1 tablet (40 mg total) by mouth daily. Take Protonix  40 mg po twice daily for 2 weeks , then take Protonix  40 mg po daily   REFRESH  TEARS PF 0.5-0.9 % SOLN Place 1 drop into both eyes See admin instructions. Instill 1 drop into affected eye(s) up to six times a day as needed for matted appearance   timolol  (TIMOPTIC ) 0.5 % ophthalmic solution INSTILL ONE DROP INTO EACH EYE TWO TIMES A DAY (Patient taking differently: Place 1 drop into both eyes in the morning and at bedtime.)    triamcinolone  (NASACORT ) 55 MCG/ACT AERO nasal inhaler Place 2 sprays into the nose daily.   No facility-administered encounter medications on file as of 11/22/2023.    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; since 2008   CORONARY ARTERY DISEASE 02/11/2008   DEPRESSION 02/11/2008   DM (diabetes mellitus) (HCC) 11/21/2011   DYSPNEA 09/11/2010   GERD 02/11/2008   HYPERLIPIDEMIA 02/11/2008   HYPOTHYROIDISM 02/11/2008   Impaired glucose tolerance 11/20/2011   LUNG CANCER, HX OF 02/11/2008   MACULAR DEGENERATION 03/29/2009   OSTEOPENIA 02/11/2008   Sarcoidosis 02/11/2008   Sight impaired 04/05/2011   Past Surgical History:  Procedure Laterality Date   BALLOON DILATION N/A 07/16/2023   Procedure: BALLOON DILATION;  Surgeon: Kenney Peacemaker, MD;  Location: [facility];  Service: Gastroenterology;  Laterality: N/A;   BIOPSY  07/16/2023   Procedure: BIOPSY;  Surgeon: Kenney Peacemaker, MD;  Location: [facility];  Service: Gastroenterology;;   ESOPHAGOGASTRODUODENOSCOPY (EGD) WITH PROPOFOL  N/A 07/16/2023   Procedure: ESOPHAGOGASTRODUODENOSCOPY (EGD) WITH PROPOFOL ;  Surgeon: Kenney Peacemaker, MD;  Location: [facility];  Service: Gastroenterology;  Laterality: N/A;   HEMOSTASIS CONTROL  07/16/2023   Procedure: HEMOSTASIS CONTROL;  Surgeon: Kenney Peacemaker, MD;  Location: [facility];  Service: Gastroenterology;;   HOT HEMOSTASIS N/A 07/16/2023   Procedure: HOT HEMOSTASIS (ARGON PLASMA COAGULATION/BICAP);  Surgeon: Kenney Peacemaker, MD;  Location: [facility];  Service: Gastroenterology;  Laterality: N/A;   s/p arm surgury  05/29/1994   s/p left upper lobectomy     SUBMUCOSAL INJECTION  07/16/2023   Procedure: SUBMUCOSAL INJECTION;  Surgeon: Kenney Peacemaker, MD;  Location: [facility];  Service: Gastroenterology;;    Family History  Problem Relation Age of Onset   Cancer Mother        thyroid  cancer   Allergies Daughter    Cancer Daughter        breast cancer   Social History   Socioeconomic History   Marital status: Widowed    Spouse name: Not on file   Number of children: Not on file   Years of education: Not on file   Highest education level: Associate degree: occupational, Scientist, product/process development, or vocational program  Occupational History   Occupation: retired Producer, television/film/video   Tobacco Use   Smoking status: Former    Current packs/day: 0.00    Types: Cigarettes    Quit date: 05/29/1994    Years since quitting: 29.5    Passive exposure: Past   Smokeless tobacco: Never  Substance and Sexual Activity   Alcohol  use: No   Drug use: No   Sexual activity: Not Currently  Other Topics Concern   Not on file  Social History Narrative   Widow   Daughter Cecile Coder is caregiver   Social Drivers of Health   Financial Resource Strain: Low Risk  (11/22/2023)   Overall Financial Resource Strain (CARDIA)    Difficulty of Paying Living Expenses: Not hard at all  Food Insecurity: No Food Insecurity (11/22/2023)   Hunger Vital Sign    Worried About Running Out of Food in the Last Year: Never true    Ran Out of Food in the Last Year: Never true  Transportation Needs: No Transportation Needs (11/22/2023)   PRAPARE - Administrator, Civil Service (Medical): No    Lack of Transportation (Non-Medical): No  Physical Activity: Insufficiently Active (11/22/2023)   Exercise Vital Sign    Days of Exercise per Week: 3 days    Minutes of Exercise per Session: 40 min  Stress: No Stress Concern Present (11/22/2023)   [employer] of Occupational Health - Occupational Stress Questionnaire    Feeling of Stress : Not at all  Social Connections: Socially Isolated (11/22/2023)   Social Connection and Isolation Panel [NHANES]    Frequency of Communication with Friends and Family: More than three  times a week    Frequency of Social Gatherings with Friends and Family: More than three times a week    Attends Religious Services: Never    Database administrator or Organizations: No    Attends Banker Meetings: Never    Marital Status: Widowed    Tobacco Counseling Counseling given: No    Clinical Intake:  Pre-visit preparation completed: Yes  Pain : No/denies pain     BMI - recorded: 22.89 Nutritional Status: BMI of 19-24  Normal Nutritional Risks: None Diabetes: Yes CBG done?: No Did pt. bring in CBG monitor from home?: No  Lab Results  Component Value Date   HGBA1C 5.5 07/01/2023   HGBA1C 5.7 12/28/2022   HGBA1C 5.6 12/08/2021     How often do you need to have someone help you when you read instructions, pamphlets, or other written materials from your doctor or pharmacy?: 1 - Never  Interpreter Needed?: No  Information entered by :: Kandy Orris, CMA   Activities of Daily Living     11/22/2023    2:24 PM 07/15/2023    8:54 PM  In your present state of health, do you have any difficulty performing the following activities:  Hearing? 0 0  Vision? 0 1  Difficulty concentrating or making decisions? 0 0  Walking or climbing stairs? 0   Dressing or bathing? 0   Doing errands, shopping? 0 1  Preparing Food and eating ? N   Using the Toilet? N   In the past six months, have you accidently leaked urine? N   Do you have problems with loss of bowel control? N   Managing your Medications? N   Managing your Finances? N   Housekeeping or managing your Housekeeping? N     Patient Care Team: Roslyn Coombe, MD as PCP - General Rankin, Alleen Arbour, MD as Consulting Physician (Ophthalmology) Frazier, [place], OD (Optometry) Janita Mellow, MD as Consulting Physician (Otolaryngology) Charity Conch, DPM as Consulting Physician (Podiatry)  Indicate any recent Medical Services you may have received from other than [facility] providers in the past year (date may  be approximate).     Assessment:   This is a routine wellness examination for [facility].  Hearing/Vision screen Hearing Screening - Comments:: Denies hearing difficulties   Vision Screening - Comments:: up to date with routine eye exams with Dr Micael Adas   Goals Addressed               This Visit's Progress     Patient Stated (pt-stated)        Patient  stated she plans to continue exercising        Depression Screen     11/22/2023    2:28 PM 07/01/2023    3:04 PM 12/28/2022    2:43 PM 06/11/2022    3:39 PM 12/08/2021    4:25 PM 12/08/2021    4:12 PM 09/27/2020    4:41 PM  PHQ 2/9 Scores  PHQ - 2 Score 0 0 0 0 0 0 0  PHQ- 9 Score 1   0  0     Fall Risk     11/22/2023    2:25 PM 07/01/2023    3:03 PM 12/28/2022    2:42 PM 06/11/2022    3:39 PM 12/08/2021    4:25 PM  Fall Risk   Falls in the past year? 0 0 0 0 0  Number falls in past yr: 0 0 0 0 0  Injury with Fall? 0 0 0 0 0  Risk for fall due to : No Fall Risks No Fall Risks No Fall Risks No Fall Risks   Follow up Falls prevention discussed;Falls evaluation completed Falls evaluation completed Falls evaluation completed Falls evaluation completed     MEDICARE RISK AT HOME:  Medicare Risk at Home Any stairs in or around the home?: No If so, are there any without handrails?: No Home free of loose throw rugs in walkways, pet beds, electrical cords, etc?: Yes Adequate lighting in your home to reduce risk of falls?: Yes Life alert?: No Use of a cane, walker or w/c?: No Grab bars in the bathroom?: Yes Shower chair or bench in shower?: Yes Elevated toilet seat or a handicapped toilet?: Yes  TIMED UP AND GO:  Was the test performed?  No  Cognitive Function: 6CIT completed        11/22/2023    2:30 PM  6CIT Screen  What Year? 0 points  What month? 0 points  What time? 0 points  Count back from 20 0 points  Months in reverse 0 points  Repeat phrase 0 points  Total Score 0 points     Immunizations Immunization History  Administered Date(s) Administered   Fluad Quad(high Dose 65+) 06/18/2019, 05/20/2020, 05/19/2021, 06/11/2022   Fluad Trivalent(High Dose 65+) 07/01/2023   Influenza Whole 09/11/2010, 04/05/2011   Influenza, High Dose Seasonal PF 05/17/2017, 05/16/2018   Influenza, Seasonal, Injecte, Preservative Fre 08/14/2012   Influenza,inj,Quad PF,6+ Mos 04/23/2013, 05/12/2014, 05/31/2015   PFIZER(Purple Top)SARS-COV-2 Vaccination 11/23/2019, 12/14/2019   Pneumococcal Conjugate-13 04/23/2013   Pneumococcal Polysaccharide-23 01/01/2006, 09/11/2010   Td 09/11/2010   Zoster, Live 01/01/2006    Screening Tests Health Maintenance  Topic Date Due   DTaP/Tdap/Td (2 - Tdap) 09/11/2020   FOOT EXAM  11/05/2023   Zoster Vaccines- Shingrix (1 of 2) 02/21/2024 (Originally 02/08/1951)   HEMOGLOBIN A1C  12/30/2023   INFLUENZA VACCINE  02/28/2024   OPHTHALMOLOGY EXAM  08/11/2024   Medicare Annual Wellness (AWV)  11/21/2024   Pneumonia Vaccine 42+ Years old  Completed   DEXA SCAN  Completed   HPV VACCINES  Aged Out   Meningococcal B Vaccine  Aged Out   COVID-19 Vaccine  Discontinued    Health Maintenance  Health Maintenance Due  Topic Date Due   DTaP/Tdap/Td (2 - Tdap) 09/11/2020   FOOT EXAM  11/05/2023   Health Maintenance Items Addressed:11/22/2023   Additional Screening:  Vision Screening: Recommended annual ophthalmology exams for early detection of glaucoma and other disorders of the eye.  Dental Screening: Recommended annual dental exams  for proper oral hygiene  Community Resource Referral / Chronic Care Management: CRR required this visit?  No   CCM required this visit?  No     Plan:     I have personally reviewed and noted the following in the patient's chart:   Medical and social history Use of alcohol , tobacco or illicit drugs  Current medications and supplements including opioid prescriptions. Patient is not currently taking opioid  prescriptions. Functional ability and status Nutritional status Physical activity Advanced directives List of other physicians Hospitalizations, surgeries, and ER visits in previous 12 months Vitals Screenings to include cognitive, depression, and falls Referrals and appointments  In addition, I have reviewed and discussed with patient certain preventive protocols, quality metrics, and best practice recommendations. A written personalized care plan for preventive services as well as general preventive health recommendations were provided to patient.     Patria Bookbinder, CMA   11/22/2023   After Visit Summary: (MyChart) Due to this being a telephonic visit, the after visit summary with patients personalized plan was offered to patient via MyChart   Notes: Nothing significant to report at this time.

## 2023-11-22 NOTE — Patient Instructions (Addendum)
 Ms. Danielle Newman , Thank you for taking time to come for your Medicare Wellness Visit. I appreciate your ongoing commitment to your health goals. Please review the following plan we discussed and let me know if I can assist you in the future.   Referrals/Orders/Follow-Ups/Clinician Recommendations: Aim for 30 minutes of exercise or brisk walking, 6-8 glasses of water, and 5 servings of fruits and vegetables each day. Educated and advised on getting the Tdap (Tetenus) vaccine in 2025.  This is a list of the screening recommended for you and due dates:  Health Maintenance  Topic Date Due   DTaP/Tdap/Td vaccine (2 - Tdap) 09/11/2020   Complete foot exam   11/05/2023   Zoster (Shingles) Vaccine (1 of 2) 02/21/2024*   Hemoglobin A1C  12/30/2023   Flu Shot  02/28/2024   Eye exam for diabetics  08/11/2024   Medicare Annual Wellness Visit  11/21/2024   Pneumonia Vaccine  Completed   DEXA scan (bone density measurement)  Completed   HPV Vaccine  Aged Out   Meningitis B Vaccine  Aged Out   COVID-19 Vaccine  Discontinued  *Topic was postponed. The date shown is not the original due date.    Advanced directives: (Copy Requested) Please bring a copy of your health care power of attorney and living will to the office to be added to your chart at your convenience. You can mail to [facility] [address] [place], [place] [address] or email to [email or web address]  Next Medicare Annual Wellness Visit scheduled for next year: Yes

## 2023-11-24 ENCOUNTER — Other Ambulatory Visit: Payer: Self-pay | Admitting: Internal Medicine

## 2023-11-25 ENCOUNTER — Other Ambulatory Visit: Payer: Self-pay

## 2023-11-28 DIAGNOSIS — H269 Unspecified cataract: Secondary | ICD-10-CM | POA: Diagnosis not present

## 2023-11-28 DIAGNOSIS — M4800 Spinal stenosis, site unspecified: Secondary | ICD-10-CM | POA: Diagnosis not present

## 2023-11-28 DIAGNOSIS — E039 Hypothyroidism, unspecified: Secondary | ICD-10-CM | POA: Diagnosis not present

## 2023-11-28 DIAGNOSIS — N183 Chronic kidney disease, stage 3 unspecified: Secondary | ICD-10-CM | POA: Diagnosis not present

## 2023-11-28 DIAGNOSIS — K219 Gastro-esophageal reflux disease without esophagitis: Secondary | ICD-10-CM | POA: Diagnosis not present

## 2023-11-28 DIAGNOSIS — I700 Atherosclerosis of aorta: Secondary | ICD-10-CM | POA: Diagnosis not present

## 2023-11-28 DIAGNOSIS — H3530 Unspecified macular degeneration: Secondary | ICD-10-CM | POA: Diagnosis not present

## 2023-11-28 DIAGNOSIS — E785 Hyperlipidemia, unspecified: Secondary | ICD-10-CM | POA: Diagnosis not present

## 2023-11-28 DIAGNOSIS — M8580 Other specified disorders of bone density and structure, unspecified site: Secondary | ICD-10-CM | POA: Diagnosis not present

## 2023-11-28 DIAGNOSIS — E1122 Type 2 diabetes mellitus with diabetic chronic kidney disease: Secondary | ICD-10-CM | POA: Diagnosis not present

## 2023-11-28 DIAGNOSIS — D869 Sarcoidosis, unspecified: Secondary | ICD-10-CM | POA: Diagnosis not present

## 2023-11-28 DIAGNOSIS — Z87891 Personal history of nicotine dependence: Secondary | ICD-10-CM | POA: Diagnosis not present

## 2023-11-28 DIAGNOSIS — H9193 Unspecified hearing loss, bilateral: Secondary | ICD-10-CM | POA: Diagnosis not present

## 2023-11-28 DIAGNOSIS — H548 Legal blindness, as defined in USA: Secondary | ICD-10-CM | POA: Diagnosis not present

## 2023-11-28 DIAGNOSIS — J309 Allergic rhinitis, unspecified: Secondary | ICD-10-CM | POA: Diagnosis not present

## 2023-11-28 DIAGNOSIS — M545 Low back pain, unspecified: Secondary | ICD-10-CM | POA: Diagnosis not present

## 2023-11-28 DIAGNOSIS — H409 Unspecified glaucoma: Secondary | ICD-10-CM | POA: Diagnosis not present

## 2023-11-28 DIAGNOSIS — M1990 Unspecified osteoarthritis, unspecified site: Secondary | ICD-10-CM | POA: Diagnosis not present

## 2023-11-28 DIAGNOSIS — I2510 Atherosclerotic heart disease of native coronary artery without angina pectoris: Secondary | ICD-10-CM | POA: Diagnosis not present

## 2023-11-28 DIAGNOSIS — I129 Hypertensive chronic kidney disease with stage 1 through stage 4 chronic kidney disease, or unspecified chronic kidney disease: Secondary | ICD-10-CM | POA: Diagnosis not present

## 2023-11-28 DIAGNOSIS — G8929 Other chronic pain: Secondary | ICD-10-CM | POA: Diagnosis not present

## 2023-11-28 DIAGNOSIS — Z85118 Personal history of other malignant neoplasm of bronchus and lung: Secondary | ICD-10-CM | POA: Diagnosis not present

## 2023-11-28 DIAGNOSIS — Z7982 Long term (current) use of aspirin: Secondary | ICD-10-CM | POA: Diagnosis not present

## 2023-12-04 DIAGNOSIS — D869 Sarcoidosis, unspecified: Secondary | ICD-10-CM | POA: Diagnosis not present

## 2023-12-04 DIAGNOSIS — M1990 Unspecified osteoarthritis, unspecified site: Secondary | ICD-10-CM | POA: Diagnosis not present

## 2023-12-04 DIAGNOSIS — E039 Hypothyroidism, unspecified: Secondary | ICD-10-CM | POA: Diagnosis not present

## 2023-12-04 DIAGNOSIS — Z7982 Long term (current) use of aspirin: Secondary | ICD-10-CM | POA: Diagnosis not present

## 2023-12-04 DIAGNOSIS — E785 Hyperlipidemia, unspecified: Secondary | ICD-10-CM | POA: Diagnosis not present

## 2023-12-04 DIAGNOSIS — I700 Atherosclerosis of aorta: Secondary | ICD-10-CM | POA: Diagnosis not present

## 2023-12-04 DIAGNOSIS — Z85118 Personal history of other malignant neoplasm of bronchus and lung: Secondary | ICD-10-CM | POA: Diagnosis not present

## 2023-12-04 DIAGNOSIS — M8580 Other specified disorders of bone density and structure, unspecified site: Secondary | ICD-10-CM | POA: Diagnosis not present

## 2023-12-04 DIAGNOSIS — H548 Legal blindness, as defined in USA: Secondary | ICD-10-CM | POA: Diagnosis not present

## 2023-12-04 DIAGNOSIS — H409 Unspecified glaucoma: Secondary | ICD-10-CM | POA: Diagnosis not present

## 2023-12-04 DIAGNOSIS — I2510 Atherosclerotic heart disease of native coronary artery without angina pectoris: Secondary | ICD-10-CM | POA: Diagnosis not present

## 2023-12-04 DIAGNOSIS — H269 Unspecified cataract: Secondary | ICD-10-CM | POA: Diagnosis not present

## 2023-12-04 DIAGNOSIS — I129 Hypertensive chronic kidney disease with stage 1 through stage 4 chronic kidney disease, or unspecified chronic kidney disease: Secondary | ICD-10-CM | POA: Diagnosis not present

## 2023-12-04 DIAGNOSIS — J309 Allergic rhinitis, unspecified: Secondary | ICD-10-CM | POA: Diagnosis not present

## 2023-12-04 DIAGNOSIS — H3530 Unspecified macular degeneration: Secondary | ICD-10-CM | POA: Diagnosis not present

## 2023-12-04 DIAGNOSIS — E1122 Type 2 diabetes mellitus with diabetic chronic kidney disease: Secondary | ICD-10-CM | POA: Diagnosis not present

## 2023-12-04 DIAGNOSIS — K219 Gastro-esophageal reflux disease without esophagitis: Secondary | ICD-10-CM | POA: Diagnosis not present

## 2023-12-04 DIAGNOSIS — M4800 Spinal stenosis, site unspecified: Secondary | ICD-10-CM | POA: Diagnosis not present

## 2023-12-04 DIAGNOSIS — N183 Chronic kidney disease, stage 3 unspecified: Secondary | ICD-10-CM | POA: Diagnosis not present

## 2023-12-04 DIAGNOSIS — H9193 Unspecified hearing loss, bilateral: Secondary | ICD-10-CM | POA: Diagnosis not present

## 2023-12-04 DIAGNOSIS — M545 Low back pain, unspecified: Secondary | ICD-10-CM | POA: Diagnosis not present

## 2023-12-04 DIAGNOSIS — G8929 Other chronic pain: Secondary | ICD-10-CM | POA: Diagnosis not present

## 2023-12-04 DIAGNOSIS — Z87891 Personal history of nicotine dependence: Secondary | ICD-10-CM | POA: Diagnosis not present

## 2023-12-18 DIAGNOSIS — H409 Unspecified glaucoma: Secondary | ICD-10-CM | POA: Diagnosis not present

## 2023-12-18 DIAGNOSIS — Z87891 Personal history of nicotine dependence: Secondary | ICD-10-CM | POA: Diagnosis not present

## 2023-12-18 DIAGNOSIS — E039 Hypothyroidism, unspecified: Secondary | ICD-10-CM | POA: Diagnosis not present

## 2023-12-18 DIAGNOSIS — E785 Hyperlipidemia, unspecified: Secondary | ICD-10-CM | POA: Diagnosis not present

## 2023-12-18 DIAGNOSIS — Z85118 Personal history of other malignant neoplasm of bronchus and lung: Secondary | ICD-10-CM | POA: Diagnosis not present

## 2023-12-18 DIAGNOSIS — M8580 Other specified disorders of bone density and structure, unspecified site: Secondary | ICD-10-CM | POA: Diagnosis not present

## 2023-12-18 DIAGNOSIS — H548 Legal blindness, as defined in USA: Secondary | ICD-10-CM | POA: Diagnosis not present

## 2023-12-18 DIAGNOSIS — H3530 Unspecified macular degeneration: Secondary | ICD-10-CM | POA: Diagnosis not present

## 2023-12-18 DIAGNOSIS — Z7982 Long term (current) use of aspirin: Secondary | ICD-10-CM | POA: Diagnosis not present

## 2023-12-18 DIAGNOSIS — M545 Low back pain, unspecified: Secondary | ICD-10-CM | POA: Diagnosis not present

## 2023-12-18 DIAGNOSIS — N183 Chronic kidney disease, stage 3 unspecified: Secondary | ICD-10-CM | POA: Diagnosis not present

## 2023-12-18 DIAGNOSIS — I2510 Atherosclerotic heart disease of native coronary artery without angina pectoris: Secondary | ICD-10-CM | POA: Diagnosis not present

## 2023-12-18 DIAGNOSIS — I700 Atherosclerosis of aorta: Secondary | ICD-10-CM | POA: Diagnosis not present

## 2023-12-18 DIAGNOSIS — G8929 Other chronic pain: Secondary | ICD-10-CM | POA: Diagnosis not present

## 2023-12-18 DIAGNOSIS — D869 Sarcoidosis, unspecified: Secondary | ICD-10-CM | POA: Diagnosis not present

## 2023-12-18 DIAGNOSIS — E1122 Type 2 diabetes mellitus with diabetic chronic kidney disease: Secondary | ICD-10-CM | POA: Diagnosis not present

## 2023-12-18 DIAGNOSIS — H9193 Unspecified hearing loss, bilateral: Secondary | ICD-10-CM | POA: Diagnosis not present

## 2023-12-18 DIAGNOSIS — I129 Hypertensive chronic kidney disease with stage 1 through stage 4 chronic kidney disease, or unspecified chronic kidney disease: Secondary | ICD-10-CM | POA: Diagnosis not present

## 2023-12-18 DIAGNOSIS — M4800 Spinal stenosis, site unspecified: Secondary | ICD-10-CM | POA: Diagnosis not present

## 2023-12-18 DIAGNOSIS — J309 Allergic rhinitis, unspecified: Secondary | ICD-10-CM | POA: Diagnosis not present

## 2023-12-18 DIAGNOSIS — K219 Gastro-esophageal reflux disease without esophagitis: Secondary | ICD-10-CM | POA: Diagnosis not present

## 2023-12-18 DIAGNOSIS — H269 Unspecified cataract: Secondary | ICD-10-CM | POA: Diagnosis not present

## 2023-12-18 DIAGNOSIS — M1990 Unspecified osteoarthritis, unspecified site: Secondary | ICD-10-CM | POA: Diagnosis not present

## 2023-12-30 ENCOUNTER — Ambulatory Visit: Payer: Medicare Other | Admitting: Internal Medicine

## 2023-12-30 DIAGNOSIS — H04123 Dry eye syndrome of bilateral lacrimal glands: Secondary | ICD-10-CM | POA: Diagnosis not present

## 2023-12-30 DIAGNOSIS — H401113 Primary open-angle glaucoma, right eye, severe stage: Secondary | ICD-10-CM | POA: Diagnosis not present

## 2023-12-30 DIAGNOSIS — H16223 Keratoconjunctivitis sicca, not specified as Sjogren's, bilateral: Secondary | ICD-10-CM | POA: Diagnosis not present

## 2023-12-30 DIAGNOSIS — H401122 Primary open-angle glaucoma, left eye, moderate stage: Secondary | ICD-10-CM | POA: Diagnosis not present

## 2024-01-03 ENCOUNTER — Other Ambulatory Visit (INDEPENDENT_AMBULATORY_CARE_PROVIDER_SITE_OTHER)

## 2024-01-03 ENCOUNTER — Ambulatory Visit: Admitting: Internal Medicine

## 2024-01-03 VITALS — BP 132/70 | HR 79 | Temp 98.3°F | Ht 66.5 in | Wt 157.0 lb

## 2024-01-03 DIAGNOSIS — E039 Hypothyroidism, unspecified: Secondary | ICD-10-CM

## 2024-01-03 DIAGNOSIS — Z0001 Encounter for general adult medical examination with abnormal findings: Secondary | ICD-10-CM

## 2024-01-03 DIAGNOSIS — E559 Vitamin D deficiency, unspecified: Secondary | ICD-10-CM

## 2024-01-03 DIAGNOSIS — D509 Iron deficiency anemia, unspecified: Secondary | ICD-10-CM | POA: Diagnosis not present

## 2024-01-03 DIAGNOSIS — E1165 Type 2 diabetes mellitus with hyperglycemia: Secondary | ICD-10-CM

## 2024-01-03 DIAGNOSIS — E538 Deficiency of other specified B group vitamins: Secondary | ICD-10-CM | POA: Diagnosis not present

## 2024-01-03 DIAGNOSIS — E7800 Pure hypercholesterolemia, unspecified: Secondary | ICD-10-CM | POA: Diagnosis not present

## 2024-01-03 DIAGNOSIS — N1831 Chronic kidney disease, stage 3a: Secondary | ICD-10-CM | POA: Diagnosis not present

## 2024-01-03 LAB — CBC WITH DIFFERENTIAL/PLATELET
Basophils Absolute: 0.1 10*3/uL (ref 0.0–0.1)
Basophils Relative: 1 % (ref 0.0–3.0)
Eosinophils Absolute: 0.2 10*3/uL (ref 0.0–0.7)
Eosinophils Relative: 2.5 % (ref 0.0–5.0)
HCT: 37.5 % (ref 36.0–46.0)
Hemoglobin: 12.4 g/dL (ref 12.0–15.0)
Lymphocytes Relative: 29.4 % (ref 12.0–46.0)
Lymphs Abs: 2.4 10*3/uL (ref 0.7–4.0)
MCHC: 33.1 g/dL (ref 30.0–36.0)
MCV: 87.6 fl (ref 78.0–100.0)
Monocytes Absolute: 0.7 10*3/uL (ref 0.1–1.0)
Monocytes Relative: 8.3 % (ref 3.0–12.0)
Neutro Abs: 4.7 10*3/uL (ref 1.4–7.7)
Neutrophils Relative %: 58.8 % (ref 43.0–77.0)
Platelets: 311 10*3/uL (ref 150.0–400.0)
RBC: 4.28 Mil/uL (ref 3.87–5.11)
RDW: 15.7 % — ABNORMAL HIGH (ref 11.5–15.5)
WBC: 8.1 10*3/uL (ref 4.0–10.5)

## 2024-01-03 LAB — URINALYSIS, ROUTINE W REFLEX MICROSCOPIC
Bilirubin Urine: NEGATIVE
Hgb urine dipstick: NEGATIVE
Ketones, ur: NEGATIVE
Nitrite: NEGATIVE
Specific Gravity, Urine: 1.02 (ref 1.000–1.030)
Total Protein, Urine: NEGATIVE
Urine Glucose: NEGATIVE
Urobilinogen, UA: 0.2 (ref 0.0–1.0)
pH: 6 (ref 5.0–8.0)

## 2024-01-03 LAB — HEPATIC FUNCTION PANEL
ALT: 10 U/L (ref 0–35)
AST: 15 U/L (ref 0–37)
Albumin: 4.1 g/dL (ref 3.5–5.2)
Alkaline Phosphatase: 65 U/L (ref 39–117)
Bilirubin, Direct: 0.1 mg/dL (ref 0.0–0.3)
Total Bilirubin: 0.4 mg/dL (ref 0.2–1.2)
Total Protein: 7.4 g/dL (ref 6.0–8.3)

## 2024-01-03 LAB — BASIC METABOLIC PANEL WITH GFR
BUN: 26 mg/dL — ABNORMAL HIGH (ref 6–23)
CO2: 34 meq/L — ABNORMAL HIGH (ref 19–32)
Calcium: 9.7 mg/dL (ref 8.4–10.5)
Chloride: 98 meq/L (ref 96–112)
Creatinine, Ser: 1.33 mg/dL — ABNORMAL HIGH (ref 0.40–1.20)
GFR: 35.38 mL/min — ABNORMAL LOW
Glucose, Bld: 99 mg/dL (ref 70–99)
Potassium: 4.7 meq/L (ref 3.5–5.1)
Sodium: 138 meq/L (ref 135–145)

## 2024-01-03 LAB — HEMOGLOBIN A1C: Hgb A1c MFr Bld: 5.7 % (ref 4.6–6.5)

## 2024-01-03 LAB — LIPID PANEL
Cholesterol: 163 mg/dL (ref 0–200)
HDL: 47.2 mg/dL (ref >39.00)
LDL Cholesterol: 73 mg/dL (ref 0–99)
NonHDL: 115.31
Total CHOL/HDL Ratio: 3
Triglycerides: 211 mg/dL — ABNORMAL HIGH (ref 0.0–149.0)
VLDL: 42.2 mg/dL — ABNORMAL HIGH (ref 0.0–40.0)

## 2024-01-03 LAB — IBC PANEL
Iron: 43 ug/dL (ref 42–145)
Saturation Ratios: 11.1 % — ABNORMAL LOW (ref 20.0–50.0)
TIBC: 387.8 ug/dL (ref 250.0–450.0)
Transferrin: 277 mg/dL (ref 212.0–360.0)

## 2024-01-03 NOTE — Progress Notes (Signed)
 Patient ID: Danielle Newman, female   DOB: [birth year], 89 y.o.   MRN: [id number]         Chief Complaint:: wellness exam and recent GI bleed with iron def anemia, debility, ckd3a, dm, hld, low thyroid , low vit d       HPI:  Danielle Newman is a 89 y.o. female here for wellness exam; for tdap and shingrix at pharmacy, o/w up to date                        Also s/p transfusion 3 units per GI in dec 2025 with UGI bleed /PUD.  Denies worsening reflux, abd pain, dysphagia, n/v, bowel change or blood.  Pt denies chest pain, increased sob or doe, wheezing, orthopnea, PND, increased LE swelling, palpitations, dizziness or syncope.   Pt denies polydipsia, polyuria, or new focal neuro s/s.    Pt denies fever, wt loss, night sweats, loss of appetite, or other constitutional symptoms  Still fatigued after rehab stay now at home with Pt and OT.  Currently taking oral iron.   Wt Readings from Last 3 Encounters:  01/03/24 157 lb (71.2 kg)  11/22/23 144 lb (65.3 kg)  08/12/23 144 lb (65.3 kg)   BP Readings from Last 3 Encounters:  01/03/24 132/70  08/12/23 (!) 104/48  07/19/23 133/67   Immunization History  Administered Date(s) Administered   Fluad Quad(high Dose 65+) 06/18/2019, 05/20/2020, 05/19/2021, 06/11/2022   Fluad Trivalent(High Dose 65+) 07/01/2023   Influenza Whole 09/11/2010, 04/05/2011   Influenza, High Dose Seasonal PF 05/17/2017, 05/16/2018   Influenza, Seasonal, Injecte, Preservative Fre 08/14/2012   Influenza,inj,Quad PF,6+ Mos 04/23/2013, 05/12/2014, 05/31/2015   PFIZER(Purple Top)SARS-COV-2 Vaccination 11/23/2019, 12/14/2019   Pneumococcal Conjugate-13 04/23/2013   Pneumococcal Polysaccharide-23 01/01/2006, 09/11/2010   Td 09/11/2010   Zoster, Live 01/01/2006   Health Maintenance Due  Topic Date Due   HEMOGLOBIN A1C  12/30/2023      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; since 2008    CORONARY ARTERY DISEASE 02/11/2008   DEPRESSION 02/11/2008   DM (diabetes mellitus) (HCC) 11/21/2011   DYSPNEA 09/11/2010   GERD 02/11/2008   HYPERLIPIDEMIA 02/11/2008   HYPOTHYROIDISM 02/11/2008   Impaired glucose tolerance 11/20/2011   LUNG CANCER, HX OF 02/11/2008   MACULAR DEGENERATION 03/29/2009   OSTEOPENIA 02/11/2008   Sarcoidosis 02/11/2008   Sight impaired 04/05/2011   Past Surgical History:  Procedure Laterality Date   BALLOON DILATION N/A 07/16/2023   Procedure: BALLOON DILATION;  Surgeon: Kenney Peacemaker, MD;  Location: [facility];  Service: Gastroenterology;  Laterality: N/A;   BIOPSY  07/16/2023   Procedure: BIOPSY;  Surgeon: Kenney Peacemaker, MD;  Location: [facility];  Service: Gastroenterology;;   ESOPHAGOGASTRODUODENOSCOPY (EGD) WITH PROPOFOL  N/A 07/16/2023   Procedure: ESOPHAGOGASTRODUODENOSCOPY (EGD) WITH PROPOFOL ;  Surgeon: Kenney Peacemaker, MD;  Location: [facility];  Service: Gastroenterology;  Laterality: N/A;   HEMOSTASIS CONTROL  07/16/2023   Procedure: HEMOSTASIS CONTROL;  Surgeon: Kenney Peacemaker, MD;  Location: [facility];  Service: Gastroenterology;;   HOT HEMOSTASIS N/A 07/16/2023   Procedure: HOT HEMOSTASIS (ARGON PLASMA COAGULATION/BICAP);  Surgeon: Kenney Peacemaker, MD;  Location: [facility];  Service: Gastroenterology;  Laterality: N/A;   s/p arm surgury  05/29/1994   s/p left upper lobectomy     SUBMUCOSAL INJECTION  07/16/2023   Procedure: SUBMUCOSAL INJECTION;  Surgeon: Loy Ruff  E, MD;  Location: [facility];  Service: Gastroenterology;;    reports that she quit smoking about 29 years ago. Her smoking use included cigarettes. She has been exposed to tobacco smoke. She has never used smokeless tobacco. She reports that she does not drink alcohol  and does not use drugs. family history includes Allergies in her daughter; Cancer in her daughter and mother. Allergies  Allergen Reactions   Fioricet [Butalbital -Apap-Caffeine ] Other (See Comments)     Confusion    Meperidine Hcl Other (See Comments)    Reaction not noted   Current Outpatient Medications on File Prior to Visit  Medication Sig Dispense Refill   Acidophilus Lactobacillus CAPS Take 1 capsule by mouth daily.     albuterol  (VENTOLIN  HFA) 108 (90 Base) MCG/ACT inhaler Inhale 2 puffs into the lungs every 6 (six) hours as needed for wheezing or shortness of breath. 8 g 0   AMBULATORY NON FORMULARY MEDICATION Custom shoe lifts to treat acquired leg length discrepancy. [employer]   Fax # [phone] 1 each 0   amoxicillin (AMOXIL) 500 MG capsule Take by mouth.     aspirin EC 81 MG tablet Take 81 mg by mouth daily. Swallow whole.     Cholecalciferol (VITAMIN D3) 50 MCG (2000 UT) TABS Take 2,000 Units by mouth daily.     ciclopirox  (PENLAC ) 8 % solution Apply topically at bedtime. Apply over nail and surrounding skin. Apply daily over previous coat. After seven (7) days, may remove with alcohol  and continue cycle. 6.6 mL 2   citalopram  (CELEXA ) 10 MG tablet Take 10 mg by mouth daily.     levothyroxine  (SYNTHROID ) 100 MCG tablet TAKE 1 TABLET BY MOUTH DAILY 90 tablet 3   lovastatin  (MEVACOR ) 40 MG tablet TAKE 1 TABLET BY MOUTH DAILY 90 tablet 3   melatonin 3 MG TABS tablet Take 3 mg by mouth at bedtime.     moxifloxacin (VIGAMOX) 0.5 % ophthalmic solution Place 1 drop into the left eye See admin instructions. Instill 1 drop into the left eye four times a day, alternating with generic Maxitrol - until the next office visit Administration times: 0800/1200/1600/2000     neomycin -polymyxin b-dexamethasone  (MAXITROL ) 3.5-10000-0.1 SUSP Place 1 drop into the left eye See admin instructions. Instill 1 drop into the left eye four times a day, alternating with generic Vigamox- until the next office visit Administration Times: 0600/1000/1400/1800     pantoprazole  (PROTONIX ) 40 MG tablet Take 1 tablet (40 mg total) by mouth daily. Take Protonix  40 mg po twice daily for 2 weeks , then take  Protonix  40 mg po daily 180 tablet 11   REFRESH TEARS PF 0.5-0.9 % SOLN Place 1 drop into both eyes See admin instructions. Instill 1 drop into affected eye(s) up to six times a day as needed for matted appearance     timolol  (TIMOPTIC ) 0.5 % ophthalmic solution INSTILL ONE DROP INTO EACH EYE TWO TIMES A DAY (Patient taking differently: Place 1 drop into both eyes in the morning and at bedtime.) 15 mL 12   triamcinolone  (NASACORT ) 55 MCG/ACT AERO nasal inhaler Place 2 sprays into the nose daily. 1 each 12   No current facility-administered medications on file prior to visit.        ROS:  All others reviewed and negative.  Objective        PE:  BP 132/70 (BP Location: Right Arm, Patient Position: Sitting, Cuff Size: Normal)   Pulse 79   Temp 98.3 F (36.8 C) (Oral)  Ht 5' 6.5" (1.689 m)   Wt 157 lb (71.2 kg)   SpO2 98%   BMI 24.96 kg/m                 Constitutional: Pt appears in NAD               HENT: Head: NCAT.                Right Ear: External ear normal.                 Left Ear: External ear normal.                Eyes: . Pupils are equal, round, and reactive to light. Conjunctivae and EOM are normal               Nose: without d/c or deformity               Neck: Neck supple. Gross normal ROM               Cardiovascular: Normal rate and regular rhythm.                 Pulmonary/Chest: Effort normal and breath sounds without rales or wheezing.                Abd:  Soft, NT, ND, + BS, no organomegaly               Neurological: Pt is alert. At baseline orientation, motor grossly intact               Skin: Skin is warm. No rashes, no other new lesions, LE edema - trace bilateral               Psychiatric: Pt behavior is normal without agitation   Micro: none  Cardiac tracings I have personally interpreted today:  none  Pertinent Radiological findings (summarize): none   Lab Results  Component Value Date   WBC 8.8 08/12/2023   HGB 10.6 (L) 08/12/2023   HCT 33.0 (L)  08/12/2023   PLT 428.0 (H) 08/12/2023   GLUCOSE 123 (H) 07/16/2023   CHOL 168 07/01/2023   TRIG 192.0 (H) 07/01/2023   HDL 43.20 07/01/2023   LDLDIRECT 95.0 12/08/2021   LDLCALC 86 07/01/2023   ALT 12 07/16/2023   AST 13 (L) 07/16/2023   NA 141 07/16/2023   K 3.8 07/16/2023   CL 108 07/16/2023   CREATININE 0.96 07/16/2023   BUN 52 (H) 07/16/2023   CO2 25 07/16/2023   TSH 0.78 12/28/2022   INR 1.2 07/17/2023   HGBA1C 5.5 07/01/2023   MICROALBUR <0.7 12/28/2022   Assessment/Plan:  Danielle Newman is a 89 y.o. White or Caucasian [1] female with  has a past medical history of ALLERGIC RHINITIS (02/11/2008), ANXIETY (02/11/2008), Blind right eye (11/20/2011), CORONARY ARTERY DISEASE (02/11/2008), DEPRESSION (02/11/2008), DM (diabetes mellitus) (HCC) (11/21/2011), DYSPNEA (09/11/2010), GERD (02/11/2008), HYPERLIPIDEMIA (02/11/2008), HYPOTHYROIDISM (02/11/2008), Impaired glucose tolerance (11/20/2011), LUNG CANCER, HX OF (02/11/2008), MACULAR DEGENERATION (03/29/2009), OSTEOPENIA (02/11/2008), Sarcoidosis (02/11/2008), and Sight impaired (04/05/2011).  Encounter for well adult exam with abnormal findings Age and sex appropriate education and counseling updated with regular exercise and diet Referrals for preventative services - none needed Immunizations addressed - for tdap and shignrix at pharmacy Smoking counseling  - none needed Evidence for depression or other mood disorder - none significant Most recent labs reviewed. I have personally reviewed and have noted: 1) the patient's medical  and social history 2) The patient's current medications and supplements 3) The patient's height, weight, and BMI have been recorded in the chart   CKD (chronic kidney disease) stage 3, GFR 30-59 ml/min Lab Results  Component Value Date   CREATININE 0.96 07/16/2023   Stable overall, cont to avoid nephrotoxins   DM (diabetes mellitus) (HCC) Lab Results  Component Value Date   HGBA1C 5.5 07/01/2023    Stable, pt to continue current medical treatment  - diet, wt control   Hyperlipidemia Lab Results  Component Value Date   LDLCALC 86 07/01/2023   Uncontrolled, for lower chol diete,, pt to continue current statin lovastatin  40 mg, declines add repatha for now   Hypothyroidism Lab Results  Component Value Date   TSH 0.78 12/28/2022   Stable, pt to continue levothyroxine  100 mcg qd   Vitamin D  deficiency Last vitamin D  Lab Results  Component Value Date   VD25OH 56.49 12/28/2022   Stable, cont oral replacement   Iron deficiency anemia No recent overt bleeding, for f/u cbc and iron lab Followup: Return in about 6 months (around 07/04/2024).  Rosalia Colonel, MD 01/03/2024 8:20 PM [facility]

## 2024-01-03 NOTE — Assessment & Plan Note (Signed)
Last vitamin D Lab Results  Component Value Date   VD25OH 56.49 12/28/2022   Stable, cont oral replacement

## 2024-01-03 NOTE — Assessment & Plan Note (Signed)
Lab Results  Component Value Date   TSH 0.78 12/28/2022   Stable, pt to continue levothyroxine 100 mcg qd

## 2024-01-03 NOTE — Assessment & Plan Note (Signed)
 Lab Results  Component Value Date   CREATININE 0.96 07/16/2023   Stable overall, cont to avoid nephrotoxins

## 2024-01-03 NOTE — Assessment & Plan Note (Signed)
 No recent overt bleeding, for f/u cbc and iron lab

## 2024-01-03 NOTE — Assessment & Plan Note (Signed)
 Lab Results  Component Value Date   HGBA1C 5.5 07/01/2023   Stable, pt to continue current medical treatment  - diet, wt control

## 2024-01-03 NOTE — Assessment & Plan Note (Signed)
 Age and sex appropriate education and counseling updated with regular exercise and diet Referrals for preventative services - none needed Immunizations addressed - for tdap and shignrix at pharmacy Smoking counseling  - none needed Evidence for depression or other mood disorder - none significant Most recent labs reviewed. I have personally reviewed and have noted: 1) the patient's medical and social history 2) The patient's current medications and supplements 3) The patient's height, weight, and BMI have been recorded in the chart

## 2024-01-03 NOTE — Patient Instructions (Addendum)
 Please continue all other medications as before, including the iron for now  Please have the pharmacy call with any other refills you may need.  Please continue your efforts at being more active, low cholesterol diet, and weight control.  You are otherwise up to date with prevention measures today.  Please keep your appointments with your specialists as you may have planned  Please go to the LAB at the blood drawing area for the tests to be done - at the [facility]  You will be contacted by phone if any changes need to be made immediately.  Otherwise, you will receive a letter about your results with an explanation, but please check with MyChart first.  Please make an Appointment to return in 6 months, or sooner if needed

## 2024-01-03 NOTE — Assessment & Plan Note (Signed)
 Lab Results  Component Value Date   LDLCALC 86 07/01/2023   Uncontrolled, for lower chol diete,, pt to continue current statin lovastatin  40 mg, declines add repatha for now

## 2024-01-08 ENCOUNTER — Ambulatory Visit: Payer: Self-pay

## 2024-01-08 NOTE — Telephone Encounter (Signed)
 FYI Only or Action Required?: Action required by provider  Patient was last seen in primary care on 01/03/2024 by Danielle Coombe, MD. Called Nurse Triage reporting Urinary Symptoms. Symptoms began over the weekend but gone now per patient. Interventions attempted: Nothing. Symptoms are: resolved per the patient.  Triage Disposition: See PCP Within 2 Weeks  Patient/caregiver understands and will follow disposition?: No, wishes to speak with PCP                    Copied from CRM [id number]. Topic: Clinical - Red Word Triage >> Jan 08, 2024  2:15 PM Danielle Newman wrote: [facility] that prompted transfer to Nurse Triage: Burning and frequent urination, stomach ache but not really in her stomach, fatigue, low appetite Reason for Disposition  All other urine symptoms  Answer Assessment - Initial Assessment Questions 1. SYMPTOM: What's the main symptom you're concerned about? (e.g., frequency, incontinence)     Burning with urination, frequent urination, generalized malaise per the daughter over the weekend.  At this time, she asked the patient how she was feeling and the patient states that she feels fine at this time with no complaints. 2. ONSET: When did the  Urinary symptoms  start?     X about 3-4 days but gone today per the patient 3. PAIN: Is there any pain? If Yes, ask: How bad is it? (Scale: 1-10; mild, moderate, severe)     ------- 4. CAUSE: What do you think is causing the symptoms?     Possible UTI ? 5. OTHER SYMPTOMS: Do you have any other symptoms? (e.g., blood in urine, fever, flank pain, pain with urination)     ----    Daughter called for her mother, the patient, and she is with the patient at this time. Patient states that she thought she might have possibly had somewhat of a urinary tract infection over the weekend.  Patient is denying any symptoms at this time and states that she feels fine.  Daughter had seen the Urine results in MyChart from the  patient's office visit on 01/03/24 with her PCP. and would like for patient's PCP Dr Danielle Newman to review her urine results and see if here is concern for UTI that she may need any antibiotics sent in for. She states that she didn't feel like she needed to set up another appointment for her mother since she isnt having any symptoms today. Daughter is advised that if anything changes to let us  know and if anything gets worse to take the patient (her mother) to the Emergency Room for further evaluation. Daughter verbalized understanding of this.  Protocols used: Urinary Symptoms-A-AH

## 2024-01-08 NOTE — Telephone Encounter (Signed)
 This should be ok to follow for now, but let us  know if the symptoms return, especially if there is fever, chills, nausea, vomiting, abd or back pain, blood in the urine or worsening pain or frequency.  thanks

## 2024-01-09 LAB — VITAMIN B12: Vitamin B-12: 115 pg/mL — ABNORMAL LOW (ref 211–911)

## 2024-01-09 LAB — TSH: TSH: 0.72 u[IU]/mL (ref 0.35–5.50)

## 2024-01-09 LAB — VITAMIN D 25 HYDROXY (VIT D DEFICIENCY, FRACTURES): VITD: 73.1 ng/mL (ref 30.00–100.00)

## 2024-01-09 LAB — FERRITIN: Ferritin: 18.2 ng/mL (ref 10.0–291.0)

## 2024-01-09 NOTE — Telephone Encounter (Signed)
 Called and unable to leave voicemail to relay message from provider.

## 2024-01-10 ENCOUNTER — Ambulatory Visit: Payer: Self-pay | Admitting: Internal Medicine

## 2024-01-14 ENCOUNTER — Telehealth: Payer: Self-pay | Admitting: Internal Medicine

## 2024-01-14 NOTE — Telephone Encounter (Signed)
 Copied from CRM [id number]. Topic: Clinical - Medication Question >> Jan 14, 2024  3:05 PM [place] wrote: Reason for CRM: patient wanting to get a refill of HYDROcodone  bit-homatropine (HYCODAN) 5-1.5 MG/5ML syrup.. she has been coughing a lot lately and it helped her a lot before

## 2024-01-15 MED ORDER — HYDROCODONE BIT-HOMATROP MBR 5-1.5 MG/5ML PO SOLN: 5.0000 mL | Freq: Four times a day (QID) | ORAL | 0 refills | Status: AC | PRN

## 2024-01-15 NOTE — Telephone Encounter (Signed)
 Ok this time, done erx    thanks

## 2024-06-08 LAB — OPHTHALMOLOGY REPORT-SCANNED

## 2024-06-29 ENCOUNTER — Other Ambulatory Visit: Payer: Self-pay

## 2024-06-29 ENCOUNTER — Other Ambulatory Visit: Payer: Self-pay | Admitting: Internal Medicine

## 2024-07-03 ENCOUNTER — Ambulatory Visit: Admitting: Internal Medicine

## 2024-07-03 ENCOUNTER — Telehealth: Payer: Self-pay

## 2024-07-03 ENCOUNTER — Encounter: Payer: Self-pay | Admitting: Internal Medicine

## 2024-07-03 ENCOUNTER — Ambulatory Visit: Payer: Self-pay | Admitting: Internal Medicine

## 2024-07-03 VITALS — BP 126/78 | HR 64 | Temp 98.1°F | Ht 66.5 in | Wt 165.0 lb

## 2024-07-03 DIAGNOSIS — Z23 Encounter for immunization: Secondary | ICD-10-CM

## 2024-07-03 DIAGNOSIS — E7800 Pure hypercholesterolemia, unspecified: Secondary | ICD-10-CM

## 2024-07-03 DIAGNOSIS — E1165 Type 2 diabetes mellitus with hyperglycemia: Secondary | ICD-10-CM

## 2024-07-03 DIAGNOSIS — E559 Vitamin D deficiency, unspecified: Secondary | ICD-10-CM

## 2024-07-03 DIAGNOSIS — E039 Hypothyroidism, unspecified: Secondary | ICD-10-CM

## 2024-07-03 DIAGNOSIS — E538 Deficiency of other specified B group vitamins: Secondary | ICD-10-CM | POA: Insufficient documentation

## 2024-07-03 LAB — BASIC METABOLIC PANEL WITH GFR
BUN: 18 mg/dL (ref 6–23)
CO2: 32 meq/L (ref 19–32)
Calcium: 9.6 mg/dL (ref 8.4–10.5)
Chloride: 98 meq/L (ref 96–112)
Creatinine, Ser: 1.02 mg/dL (ref 0.40–1.20)
GFR: 48.47 mL/min — ABNORMAL LOW (ref >60.00)
Glucose, Bld: 90 mg/dL (ref 70–99)
Potassium: 4.8 meq/L (ref 3.5–5.1)
Sodium: 138 meq/L (ref 135–145)

## 2024-07-03 LAB — CBC WITH DIFFERENTIAL/PLATELET
Basophils Absolute: 0 K/uL (ref 0.0–0.1)
Basophils Relative: 0.5 % (ref 0.0–3.0)
Eosinophils Absolute: 0.2 K/uL (ref 0.0–0.7)
Eosinophils Relative: 2.9 % (ref 0.0–5.0)
HCT: 38.8 % (ref 36.0–46.0)
Hemoglobin: 13.2 g/dL (ref 12.0–15.0)
Lymphocytes Relative: 25.8 % (ref 12.0–46.0)
Lymphs Abs: 2.1 K/uL (ref 0.7–4.0)
MCHC: 34.1 g/dL (ref 30.0–36.0)
MCV: 89.8 fl (ref 78.0–100.0)
Monocytes Absolute: 0.7 K/uL (ref 0.1–1.0)
Monocytes Relative: 8.4 % (ref 3.0–12.0)
Neutro Abs: 5 K/uL (ref 1.4–7.7)
Neutrophils Relative %: 62.4 % (ref 43.0–77.0)
Platelets: 301 K/uL (ref 150.0–400.0)
RBC: 4.32 Mil/uL (ref 3.87–5.11)
RDW: 13.5 % (ref 11.5–15.5)
WBC: 8 K/uL (ref 4.0–10.5)

## 2024-07-03 LAB — HEPATIC FUNCTION PANEL
ALT: 10 U/L (ref 0–35)
AST: 17 U/L (ref 0–37)
Albumin: 4.2 g/dL (ref 3.5–5.2)
Alkaline Phosphatase: 55 U/L (ref 39–117)
Bilirubin, Direct: 0 mg/dL (ref 0.0–0.3)
Total Bilirubin: 0.5 mg/dL (ref 0.2–1.2)
Total Protein: 7.8 g/dL (ref 6.0–8.3)

## 2024-07-03 LAB — VITAMIN D 25 HYDROXY (VIT D DEFICIENCY, FRACTURES): VITD: 116.56 ng/mL (ref 30.00–100.00)

## 2024-07-03 LAB — LIPID PANEL
Cholesterol: 149 mg/dL (ref 0–200)
HDL: 42 mg/dL (ref >39.00)
LDL Cholesterol: 70 mg/dL (ref 0–99)
NonHDL: 106.79
Total CHOL/HDL Ratio: 4
Triglycerides: 185 mg/dL — ABNORMAL HIGH (ref 0.0–149.0)
VLDL: 37 mg/dL (ref 0.0–40.0)

## 2024-07-03 LAB — VITAMIN B12: Vitamin B-12: 196 pg/mL — ABNORMAL LOW (ref 211–911)

## 2024-07-03 LAB — TSH: TSH: 0.57 u[IU]/mL (ref 0.35–5.50)

## 2024-07-03 LAB — HEMOGLOBIN A1C: Hgb A1c MFr Bld: 5.4 % (ref 4.6–6.5)

## 2024-07-03 MED ORDER — HYDROCODONE BIT-HOMATROP MBR 5-1.5 MG/5ML PO SOLN: 5.0000 mL | Freq: Four times a day (QID) | ORAL | 0 refills | Status: AC | PRN

## 2024-07-03 NOTE — Assessment & Plan Note (Signed)
 Lab Results  Component Value Date   LDLCALC 70 07/03/2024   uncontrolled, pt to continue current statin lovastatin  40 mg every day, declines further change

## 2024-07-03 NOTE — Progress Notes (Signed)
 Patient ID: Danielle Newman, female   DOB: [birth year], 89 y.o.   MRN: [id number]        Chief Complaint: follow up low b12 and vit d, chronic cough, low thyroid , hld       HPI:  Danielle Newman is a 89 y.o. female here with daughter, overall doing nicely, Pt denies chest pain, increased sob or doe, wheezing, orthopnea, PND, increased LE swelling, palpitations, dizziness or syncope.   Pt denies polydipsia, polyuria, or new focal neuro s/s.    Pt denies fever, wt loss, night sweats, loss of appetite, or other constitutional symptoms  Gaining wt with better diet.  Due for flu shot.  Does have spasm of dry cough at times, worse at night lying down, can't sleep well., miserable at times. 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; since 2008   CORONARY ARTERY DISEASE 02/11/2008   DEPRESSION 02/11/2008   DM (diabetes mellitus) (HCC) 11/21/2011   DYSPNEA 09/11/2010   GERD 02/11/2008   HYPERLIPIDEMIA 02/11/2008   HYPOTHYROIDISM 02/11/2008   Impaired glucose tolerance 11/20/2011   LUNG CANCER, HX OF 02/11/2008   MACULAR DEGENERATION 03/29/2009   OSTEOPENIA 02/11/2008   Sarcoidosis 02/11/2008   Sight impaired 04/05/2011   Past Surgical History:  Procedure Laterality Date   BALLOON DILATION N/A 07/16/2023   Procedure: BALLOON DILATION;  Surgeon: Avram Lupita BRAVO, MD;  Location: [facility];  Service: Gastroenterology;  Laterality: N/A;   BIOPSY  07/16/2023   Procedure: BIOPSY;  Surgeon: Avram Lupita BRAVO, MD;  Location: [facility];  Service: Gastroenterology;;   ESOPHAGOGASTRODUODENOSCOPY (EGD) WITH PROPOFOL  N/A 07/16/2023   Procedure: ESOPHAGOGASTRODUODENOSCOPY (EGD) WITH PROPOFOL ;   Surgeon: Avram Lupita BRAVO, MD;  Location: [facility];  Service: Gastroenterology;  Laterality: N/A;   HEMOSTASIS CONTROL  07/16/2023   Procedure: HEMOSTASIS CONTROL;  Surgeon: Avram Lupita BRAVO, MD;  Location: [facility];  Service: Gastroenterology;;   HOT HEMOSTASIS N/A 07/16/2023   Procedure: HOT HEMOSTASIS (ARGON PLASMA COAGULATION/BICAP);  Surgeon: Avram Lupita BRAVO, MD;  Location: [facility];  Service: Gastroenterology;  Laterality: N/A;   s/p arm surgury  05/29/1994   s/p left upper lobectomy     SUBMUCOSAL INJECTION  07/16/2023   Procedure: SUBMUCOSAL INJECTION;  Surgeon: Avram Lupita BRAVO, MD;  Location: [facility];  Service: Gastroenterology;;    reports that she quit smoking about 30 years ago. Her smoking use included cigarettes. She has been exposed to tobacco smoke. She has never used smokeless tobacco. She reports that she does not drink alcohol  and does not use drugs. family history includes Allergies in her daughter; Cancer in her daughter and mother. Allergies  Allergen Reactions   Fioricet [Butalbital -Apap-Caffeine ] Other (See Comments)    Confusion    Meperidine Hcl Other (See Comments)    Reaction not noted   Current Outpatient Medications on File Prior to Visit  Medication Sig Dispense Refill   Acidophilus Lactobacillus CAPS Take 1 capsule by mouth daily.     albuterol  (VENTOLIN  HFA) 108 (90 Base) MCG/ACT inhaler Inhale 2 puffs into the lungs every 6 (six) hours as needed for wheezing  or shortness of breath. 8 g 0   AMBULATORY NON FORMULARY MEDICATION Custom shoe lifts to treat acquired leg length discrepancy. [employer]   Fax # [phone] 1 each 0   amoxicillin (AMOXIL) 500 MG capsule Take by mouth.     aspirin EC 81 MG tablet Take 81 mg by mouth daily. Swallow whole.     Cholecalciferol (VITAMIN D3) 50 MCG (2000 UT) TABS Take 2,000 Units by mouth daily.     ciclopirox  (PENLAC ) 8 % solution Apply topically at bedtime. Apply over nail and surrounding skin.  Apply daily over previous coat. After seven (7) days, may remove with alcohol  and continue cycle. 6.6 mL 2   citalopram  (CELEXA ) 10 MG tablet TAKE 1 TABLET BY MOUTH DAILY 90 tablet 0   levothyroxine  (SYNTHROID ) 100 MCG tablet TAKE 1 TABLET BY MOUTH DAILY 90 tablet 3   lovastatin  (MEVACOR ) 40 MG tablet TAKE 1 TABLET BY MOUTH DAILY 90 tablet 3   melatonin 3 MG TABS tablet Take 3 mg by mouth at bedtime.     moxifloxacin (VIGAMOX) 0.5 % ophthalmic solution Place 1 drop into the left eye See admin instructions. Instill 1 drop into the left eye four times a day, alternating with generic Maxitrol - until the next office visit Administration times: 0800/1200/1600/2000     neomycin -polymyxin b-dexamethasone  (MAXITROL ) 3.5-10000-0.1 SUSP Place 1 drop into the left eye See admin instructions. Instill 1 drop into the left eye four times a day, alternating with generic Vigamox- until the next office visit Administration Times: 0600/1000/1400/1800     pantoprazole  (PROTONIX ) 40 MG tablet Take 1 tablet (40 mg total) by mouth daily. Take Protonix  40 mg po twice daily for 2 weeks , then take Protonix  40 mg po daily 180 tablet 11   REFRESH TEARS PF 0.5-0.9 % SOLN Place 1 drop into both eyes See admin instructions. Instill 1 drop into affected eye(s) up to six times a day as needed for matted appearance     timolol  (TIMOPTIC ) 0.5 % ophthalmic solution INSTILL ONE DROP INTO EACH EYE TWO TIMES A DAY (Patient taking differently: Place 1 drop into both eyes in the morning and at bedtime.) 15 mL 12   triamcinolone  (NASACORT ) 55 MCG/ACT AERO nasal inhaler Place 2 sprays into the nose daily. 1 each 12   VEVYE 0.1 % SOLN Apply 1 drop to eye 2 (two) times daily.     No current facility-administered medications on file prior to visit.        ROS:  All others reviewed and negative.  Objective        PE:  BP 126/78 (BP Location: Right Arm, Patient Position: Sitting, Cuff Size: Normal)   Pulse 64   Temp 98.1 F (36.7 C)  (Oral)   Ht 5' 6.5 (1.689 m)   Wt 165 lb (74.8 kg)   SpO2 95%   BMI 26.23 kg/m                 Constitutional: Pt appears in NAD               HENT: Head: NCAT.                Right Ear: External ear normal.                 Left Ear: External ear normal.                Eyes: . Pupils are equal, round, and reactive to light. Conjunctivae and  EOM are normal               Nose: without d/c or deformity               Neck: Neck supple. Gross normal ROM               Cardiovascular: Normal rate and regular rhythm.                 Pulmonary/Chest: Effort normal and breath sounds without rales or wheezing.                Abd:  Soft, NT, ND, + BS, no organomegaly               Neurological: Pt is alert. At baseline orientation, motor grossly intact               Skin: Skin is warm. No rashes, no other new lesions, LE edema - none               Psychiatric: Pt behavior is normal without agitation   Micro: none  Cardiac tracings I have personally interpreted today:  none  Pertinent Radiological findings (summarize): none   Lab Results  Component Value Date   WBC 8.0 07/03/2024   HGB 13.2 07/03/2024   HCT 38.8 07/03/2024   PLT 301.0 07/03/2024   GLUCOSE 90 07/03/2024   CHOL 149 07/03/2024   TRIG 185.0 (H) 07/03/2024   HDL 42.00 07/03/2024   LDLDIRECT 95.0 12/08/2021   LDLCALC 70 07/03/2024   ALT 10 07/03/2024   AST 17 07/03/2024   NA 138 07/03/2024   K 4.8 07/03/2024   CL 98 07/03/2024   CREATININE 1.02 07/03/2024   BUN 18 07/03/2024   CO2 32 07/03/2024   TSH 0.57 07/03/2024   INR 1.2 07/17/2023   HGBA1C 5.4 07/03/2024   Assessment/Plan:  Danielle Newman is a 89 y.o. White or Caucasian [1] female with  has a past medical history of ALLERGIC RHINITIS (02/11/2008), ANXIETY (02/11/2008), Blind right eye (11/20/2011), CORONARY ARTERY DISEASE (02/11/2008), DEPRESSION (02/11/2008), DM (diabetes mellitus) (HCC) (11/21/2011), DYSPNEA (09/11/2010), GERD (02/11/2008), HYPERLIPIDEMIA  (02/11/2008), HYPOTHYROIDISM (02/11/2008), Impaired glucose tolerance (11/20/2011), LUNG CANCER, HX OF (02/11/2008), MACULAR DEGENERATION (03/29/2009), OSTEOPENIA (02/11/2008), Sarcoidosis (02/11/2008), and Sight impaired (04/05/2011).  Vitamin D  deficiency Last vitamin D  Lab Results  Component Value Date   VD25OH 116.56 [facility]) 07/03/2024   overcontrolled, cont oral replacement at reduced mon - fri only   Hypothyroidism Lab Results  Component Value Date   TSH 0.57 07/03/2024   Stable, pt to continue levothyroxine  100 mcg qd   Hyperlipidemia Lab Results  Component Value Date   LDLCALC 70 07/03/2024   uncontrolled, pt to continue current statin lovastatin  40 mg every day, declines further change   DM (diabetes mellitus) (HCC) With hyperglycemia, without insulin   Lab Results  Component Value Date   HGBA1C 5.4 07/03/2024   Stable, pt to continue current medical treatment  - diet, wt control   B12 deficiency Lab Results  Component Value Date   VITAMINB12 196 (L) 07/03/2024   Low, to start oral replacement - b12 1000 mcg qd  Followup: Return in about 6 months (around 01/01/2025).  Lynwood Rush, MD 07/03/2024 8:30 PM [facility]

## 2024-07-03 NOTE — Patient Instructions (Signed)
 You had the flu shot today  Please continue all other medications as before, and refills have been done for the cough medicine  Please have the pharmacy call with any other refills you may need.  Please continue your efforts at being more active, low cholesterol diet, and weight control.  Please keep your appointments with your specialists as you may have planned  Please go to the LAB at the blood drawing area for the tests to be done  You will be contacted by phone if any changes need to be made immediately.  Otherwise, you will receive a letter about your results with an explanation, but please check with MyChart first.  Please make an Appointment to return in 6 months, or sooner if needed

## 2024-07-03 NOTE — Assessment & Plan Note (Signed)
 Lab Results  Component Value Date   TSH 0.57 07/03/2024   Stable, pt to continue levothyroxine  100 mcg qd

## 2024-07-03 NOTE — Assessment & Plan Note (Signed)
 Last vitamin D  Lab Results  Component Value Date   VD25OH 116.56 [facility]) 07/03/2024   overcontrolled, cont oral replacement at reduced mon - fri only

## 2024-07-03 NOTE — Assessment & Plan Note (Signed)
 Lab Results  Component Value Date   VITAMINB12 196 (L) 07/03/2024   Low, to start oral replacement - b12 1000 mcg qd

## 2024-07-03 NOTE — Assessment & Plan Note (Signed)
 With hyperglycemia, without insulin   Lab Results  Component Value Date   HGBA1C 5.4 07/03/2024   Stable, pt to continue current medical treatment  - diet, wt control

## 2024-07-03 NOTE — Telephone Encounter (Signed)
 Ok to contact pt  Vit D slightly high, so ok to take the OTC Vit D3 she takes on Monday through Friday only.   Thanks

## 2024-07-06 NOTE — Telephone Encounter (Signed)
 Called and unable to leave voicemail

## 2024-08-19 ENCOUNTER — Other Ambulatory Visit: Payer: Self-pay

## 2024-08-19 MED ORDER — PANTOPRAZOLE SODIUM 40 MG PO TBEC: 40.0000 mg | DELAYED_RELEASE_TABLET | Freq: Every day | ORAL | 1 refills | Status: AC

## 2024-08-19 NOTE — Telephone Encounter (Signed)
Pantoprazole refilled as pharmacy requested. 

## 2024-11-06 ENCOUNTER — Ambulatory Visit: Admitting: Nurse Practitioner

## 2024-11-24 ENCOUNTER — Ambulatory Visit
